# Patient Record
Sex: Female | Born: 1945 | Race: White | Hispanic: No | Marital: Single | Smoking: Never smoker
Health system: Southern US, Community
[De-identification: ages and names within clinical notes are randomized; demographics above are authoritative.]

## PROBLEM LIST (undated history)

## (undated) DIAGNOSIS — R011 Cardiac murmur, unspecified: Secondary | ICD-10-CM

## (undated) DIAGNOSIS — R0601 Orthopnea: Secondary | ICD-10-CM

## (undated) DIAGNOSIS — G479 Sleep disorder, unspecified: Secondary | ICD-10-CM

## (undated) DIAGNOSIS — D1803 Hemangioma of intra-abdominal structures: Secondary | ICD-10-CM

## (undated) DIAGNOSIS — J4520 Mild intermittent asthma, uncomplicated: Secondary | ICD-10-CM

## (undated) DIAGNOSIS — K759 Inflammatory liver disease, unspecified: Secondary | ICD-10-CM

## (undated) DIAGNOSIS — K2210 Ulcer of esophagus without bleeding: Secondary | ICD-10-CM

## (undated) DIAGNOSIS — I499 Cardiac arrhythmia, unspecified: Secondary | ICD-10-CM

## (undated) DIAGNOSIS — K219 Gastro-esophageal reflux disease without esophagitis: Secondary | ICD-10-CM

## (undated) DIAGNOSIS — R609 Edema, unspecified: Secondary | ICD-10-CM

## (undated) DIAGNOSIS — E785 Hyperlipidemia, unspecified: Secondary | ICD-10-CM

## (undated) DIAGNOSIS — N189 Chronic kidney disease, unspecified: Secondary | ICD-10-CM

## (undated) DIAGNOSIS — M810 Age-related osteoporosis without current pathological fracture: Secondary | ICD-10-CM

## (undated) DIAGNOSIS — R809 Proteinuria, unspecified: Secondary | ICD-10-CM

## (undated) DIAGNOSIS — T8859XA Other complications of anesthesia, initial encounter: Secondary | ICD-10-CM

## (undated) DIAGNOSIS — Z8719 Personal history of other diseases of the digestive system: Secondary | ICD-10-CM

## (undated) DIAGNOSIS — E041 Nontoxic single thyroid nodule: Secondary | ICD-10-CM

## (undated) DIAGNOSIS — I341 Nonrheumatic mitral (valve) prolapse: Secondary | ICD-10-CM

## (undated) DIAGNOSIS — T4145XA Adverse effect of unspecified anesthetic, initial encounter: Secondary | ICD-10-CM

## (undated) DIAGNOSIS — K824 Cholesterolosis of gallbladder: Secondary | ICD-10-CM

## (undated) DIAGNOSIS — R911 Solitary pulmonary nodule: Secondary | ICD-10-CM

## (undated) HISTORY — PX: TRIGGER FINGER RELEASE: SHX641

## (undated) HISTORY — DX: Hemangioma of intra-abdominal structures: D18.03

## (undated) HISTORY — DX: Sleep disorder, unspecified: G47.9

## (undated) HISTORY — PX: TONSILLECTOMY: SUR1361

## (undated) HISTORY — DX: Mild intermittent asthma, uncomplicated: J45.20

## (undated) HISTORY — DX: Age-related osteoporosis without current pathological fracture: M81.0

## (undated) HISTORY — DX: Ulcer of esophagus without bleeding: K22.10

## (undated) HISTORY — DX: Proteinuria, unspecified: R80.9

## (undated) HISTORY — DX: Nontoxic single thyroid nodule: E04.1

## (undated) HISTORY — DX: Solitary pulmonary nodule: R91.1

## (undated) HISTORY — PX: APPENDECTOMY: SHX54

## (undated) HISTORY — DX: Nonrheumatic mitral (valve) prolapse: I34.1

## (undated) HISTORY — DX: Cholesterolosis of gallbladder: K82.4

## (undated) HISTORY — PX: SHOULDER SURGERY: SHX246

## (undated) HISTORY — PX: GANGLION CYST EXCISION: SHX1691

## (undated) HISTORY — DX: Hyperlipidemia, unspecified: E78.5

---

## 1985-05-09 HISTORY — PX: CARPAL TUNNEL RELEASE: SHX101

## 1999-01-08 DIAGNOSIS — R911 Solitary pulmonary nodule: Secondary | ICD-10-CM

## 1999-01-08 HISTORY — DX: Solitary pulmonary nodule: R91.1

## 2000-07-07 LAB — HM COLONOSCOPY: HM Colonoscopy: NORMAL

## 2011-05-11 DIAGNOSIS — M24139 Other articular cartilage disorders, unspecified wrist: Secondary | ICD-10-CM | POA: Diagnosis not present

## 2011-05-11 DIAGNOSIS — M25549 Pain in joints of unspecified hand: Secondary | ICD-10-CM | POA: Diagnosis not present

## 2011-05-11 DIAGNOSIS — M25539 Pain in unspecified wrist: Secondary | ICD-10-CM | POA: Diagnosis not present

## 2011-05-13 DIAGNOSIS — M24139 Other articular cartilage disorders, unspecified wrist: Secondary | ICD-10-CM | POA: Diagnosis not present

## 2011-05-13 DIAGNOSIS — M25539 Pain in unspecified wrist: Secondary | ICD-10-CM | POA: Diagnosis not present

## 2011-05-13 DIAGNOSIS — M25549 Pain in joints of unspecified hand: Secondary | ICD-10-CM | POA: Diagnosis not present

## 2011-05-17 DIAGNOSIS — M25539 Pain in unspecified wrist: Secondary | ICD-10-CM | POA: Diagnosis not present

## 2011-05-17 DIAGNOSIS — M25549 Pain in joints of unspecified hand: Secondary | ICD-10-CM | POA: Diagnosis not present

## 2011-05-17 DIAGNOSIS — M24139 Other articular cartilage disorders, unspecified wrist: Secondary | ICD-10-CM | POA: Diagnosis not present

## 2011-05-20 DIAGNOSIS — M25549 Pain in joints of unspecified hand: Secondary | ICD-10-CM | POA: Diagnosis not present

## 2011-05-20 DIAGNOSIS — M25539 Pain in unspecified wrist: Secondary | ICD-10-CM | POA: Diagnosis not present

## 2011-05-20 DIAGNOSIS — M24139 Other articular cartilage disorders, unspecified wrist: Secondary | ICD-10-CM | POA: Diagnosis not present

## 2011-05-25 DIAGNOSIS — H40019 Open angle with borderline findings, low risk, unspecified eye: Secondary | ICD-10-CM | POA: Diagnosis not present

## 2011-05-25 DIAGNOSIS — R5381 Other malaise: Secondary | ICD-10-CM | POA: Diagnosis not present

## 2011-05-25 DIAGNOSIS — M109 Gout, unspecified: Secondary | ICD-10-CM | POA: Diagnosis not present

## 2011-05-25 DIAGNOSIS — R5383 Other fatigue: Secondary | ICD-10-CM | POA: Diagnosis not present

## 2011-05-25 DIAGNOSIS — E559 Vitamin D deficiency, unspecified: Secondary | ICD-10-CM | POA: Diagnosis not present

## 2011-05-25 DIAGNOSIS — E785 Hyperlipidemia, unspecified: Secondary | ICD-10-CM | POA: Diagnosis not present

## 2011-05-25 DIAGNOSIS — I059 Rheumatic mitral valve disease, unspecified: Secondary | ICD-10-CM | POA: Diagnosis not present

## 2011-05-25 DIAGNOSIS — N182 Chronic kidney disease, stage 2 (mild): Secondary | ICD-10-CM | POA: Diagnosis not present

## 2011-05-25 DIAGNOSIS — D509 Iron deficiency anemia, unspecified: Secondary | ICD-10-CM | POA: Diagnosis not present

## 2011-05-25 DIAGNOSIS — E039 Hypothyroidism, unspecified: Secondary | ICD-10-CM | POA: Diagnosis not present

## 2011-06-01 DIAGNOSIS — M25549 Pain in joints of unspecified hand: Secondary | ICD-10-CM | POA: Diagnosis not present

## 2011-06-01 DIAGNOSIS — M24139 Other articular cartilage disorders, unspecified wrist: Secondary | ICD-10-CM | POA: Diagnosis not present

## 2011-06-01 DIAGNOSIS — M25539 Pain in unspecified wrist: Secondary | ICD-10-CM | POA: Diagnosis not present

## 2011-06-08 DIAGNOSIS — M24139 Other articular cartilage disorders, unspecified wrist: Secondary | ICD-10-CM | POA: Diagnosis not present

## 2011-06-08 DIAGNOSIS — M25539 Pain in unspecified wrist: Secondary | ICD-10-CM | POA: Diagnosis not present

## 2011-06-08 DIAGNOSIS — M25549 Pain in joints of unspecified hand: Secondary | ICD-10-CM | POA: Diagnosis not present

## 2011-06-10 DIAGNOSIS — M25549 Pain in joints of unspecified hand: Secondary | ICD-10-CM | POA: Diagnosis not present

## 2011-06-10 DIAGNOSIS — M24139 Other articular cartilage disorders, unspecified wrist: Secondary | ICD-10-CM | POA: Diagnosis not present

## 2011-06-10 DIAGNOSIS — M25539 Pain in unspecified wrist: Secondary | ICD-10-CM | POA: Diagnosis not present

## 2011-06-15 DIAGNOSIS — M25549 Pain in joints of unspecified hand: Secondary | ICD-10-CM | POA: Diagnosis not present

## 2011-06-15 DIAGNOSIS — M24139 Other articular cartilage disorders, unspecified wrist: Secondary | ICD-10-CM | POA: Diagnosis not present

## 2011-06-15 DIAGNOSIS — M25539 Pain in unspecified wrist: Secondary | ICD-10-CM | POA: Diagnosis not present

## 2011-11-14 DIAGNOSIS — D509 Iron deficiency anemia, unspecified: Secondary | ICD-10-CM | POA: Diagnosis not present

## 2011-11-14 DIAGNOSIS — I059 Rheumatic mitral valve disease, unspecified: Secondary | ICD-10-CM | POA: Diagnosis not present

## 2011-11-14 DIAGNOSIS — R5381 Other malaise: Secondary | ICD-10-CM | POA: Diagnosis not present

## 2011-11-14 DIAGNOSIS — E213 Hyperparathyroidism, unspecified: Secondary | ICD-10-CM | POA: Diagnosis not present

## 2011-11-14 DIAGNOSIS — E559 Vitamin D deficiency, unspecified: Secondary | ICD-10-CM | POA: Diagnosis not present

## 2011-11-14 DIAGNOSIS — H40019 Open angle with borderline findings, low risk, unspecified eye: Secondary | ICD-10-CM | POA: Diagnosis not present

## 2011-11-14 DIAGNOSIS — E039 Hypothyroidism, unspecified: Secondary | ICD-10-CM | POA: Diagnosis not present

## 2011-11-14 DIAGNOSIS — E785 Hyperlipidemia, unspecified: Secondary | ICD-10-CM | POA: Diagnosis not present

## 2012-01-05 DIAGNOSIS — E039 Hypothyroidism, unspecified: Secondary | ICD-10-CM | POA: Diagnosis not present

## 2012-01-05 DIAGNOSIS — E785 Hyperlipidemia, unspecified: Secondary | ICD-10-CM | POA: Diagnosis not present

## 2012-01-05 DIAGNOSIS — M545 Low back pain: Secondary | ICD-10-CM | POA: Diagnosis not present

## 2012-01-05 DIAGNOSIS — Z23 Encounter for immunization: Secondary | ICD-10-CM | POA: Diagnosis not present

## 2012-01-05 DIAGNOSIS — R5381 Other malaise: Secondary | ICD-10-CM | POA: Diagnosis not present

## 2012-01-05 DIAGNOSIS — R5383 Other fatigue: Secondary | ICD-10-CM | POA: Diagnosis not present

## 2012-01-05 DIAGNOSIS — I059 Rheumatic mitral valve disease, unspecified: Secondary | ICD-10-CM | POA: Diagnosis not present

## 2012-01-30 DIAGNOSIS — M810 Age-related osteoporosis without current pathological fracture: Secondary | ICD-10-CM | POA: Diagnosis not present

## 2012-02-22 DIAGNOSIS — M5126 Other intervertebral disc displacement, lumbar region: Secondary | ICD-10-CM | POA: Diagnosis not present

## 2012-02-22 DIAGNOSIS — M549 Dorsalgia, unspecified: Secondary | ICD-10-CM | POA: Diagnosis not present

## 2012-04-25 DIAGNOSIS — E039 Hypothyroidism, unspecified: Secondary | ICD-10-CM | POA: Diagnosis not present

## 2012-04-25 DIAGNOSIS — E785 Hyperlipidemia, unspecified: Secondary | ICD-10-CM | POA: Diagnosis not present

## 2012-04-25 DIAGNOSIS — R5383 Other fatigue: Secondary | ICD-10-CM | POA: Diagnosis not present

## 2012-04-25 DIAGNOSIS — E119 Type 2 diabetes mellitus without complications: Secondary | ICD-10-CM | POA: Diagnosis not present

## 2012-04-25 DIAGNOSIS — R978 Other abnormal tumor markers: Secondary | ICD-10-CM | POA: Diagnosis not present

## 2012-04-25 DIAGNOSIS — E559 Vitamin D deficiency, unspecified: Secondary | ICD-10-CM | POA: Diagnosis not present

## 2012-04-25 DIAGNOSIS — IMO0002 Reserved for concepts with insufficient information to code with codable children: Secondary | ICD-10-CM | POA: Diagnosis not present

## 2012-04-25 DIAGNOSIS — D509 Iron deficiency anemia, unspecified: Secondary | ICD-10-CM | POA: Diagnosis not present

## 2012-04-25 DIAGNOSIS — R5381 Other malaise: Secondary | ICD-10-CM | POA: Diagnosis not present

## 2012-04-25 DIAGNOSIS — N390 Urinary tract infection, site not specified: Secondary | ICD-10-CM | POA: Diagnosis not present

## 2012-05-18 DIAGNOSIS — R0789 Other chest pain: Secondary | ICD-10-CM | POA: Diagnosis not present

## 2012-05-18 DIAGNOSIS — R002 Palpitations: Secondary | ICD-10-CM | POA: Diagnosis not present

## 2012-05-18 DIAGNOSIS — M545 Low back pain: Secondary | ICD-10-CM | POA: Diagnosis not present

## 2012-05-18 DIAGNOSIS — I059 Rheumatic mitral valve disease, unspecified: Secondary | ICD-10-CM | POA: Diagnosis not present

## 2012-05-18 DIAGNOSIS — Z01419 Encounter for gynecological examination (general) (routine) without abnormal findings: Secondary | ICD-10-CM | POA: Diagnosis not present

## 2012-05-22 DIAGNOSIS — N281 Cyst of kidney, acquired: Secondary | ICD-10-CM | POA: Diagnosis not present

## 2012-05-22 DIAGNOSIS — N949 Unspecified condition associated with female genital organs and menstrual cycle: Secondary | ICD-10-CM | POA: Diagnosis not present

## 2012-05-22 DIAGNOSIS — K824 Cholesterolosis of gallbladder: Secondary | ICD-10-CM | POA: Diagnosis not present

## 2012-05-22 DIAGNOSIS — E049 Nontoxic goiter, unspecified: Secondary | ICD-10-CM | POA: Diagnosis not present

## 2012-06-05 DIAGNOSIS — N182 Chronic kidney disease, stage 2 (mild): Secondary | ICD-10-CM | POA: Diagnosis not present

## 2012-06-07 DIAGNOSIS — Z5181 Encounter for therapeutic drug level monitoring: Secondary | ICD-10-CM | POA: Diagnosis not present

## 2012-06-07 DIAGNOSIS — M5137 Other intervertebral disc degeneration, lumbosacral region: Secondary | ICD-10-CM | POA: Diagnosis not present

## 2012-06-28 DIAGNOSIS — M5137 Other intervertebral disc degeneration, lumbosacral region: Secondary | ICD-10-CM | POA: Diagnosis not present

## 2012-06-28 DIAGNOSIS — Z5181 Encounter for therapeutic drug level monitoring: Secondary | ICD-10-CM | POA: Diagnosis not present

## 2012-07-16 DIAGNOSIS — H2510 Age-related nuclear cataract, unspecified eye: Secondary | ICD-10-CM | POA: Diagnosis not present

## 2012-07-16 DIAGNOSIS — H40019 Open angle with borderline findings, low risk, unspecified eye: Secondary | ICD-10-CM | POA: Diagnosis not present

## 2012-07-26 DIAGNOSIS — M545 Low back pain: Secondary | ICD-10-CM | POA: Diagnosis not present

## 2012-07-26 DIAGNOSIS — R5381 Other malaise: Secondary | ICD-10-CM | POA: Diagnosis not present

## 2012-07-26 DIAGNOSIS — R002 Palpitations: Secondary | ICD-10-CM | POA: Diagnosis not present

## 2012-07-26 DIAGNOSIS — Z79899 Other long term (current) drug therapy: Secondary | ICD-10-CM | POA: Diagnosis not present

## 2012-07-26 DIAGNOSIS — M810 Age-related osteoporosis without current pathological fracture: Secondary | ICD-10-CM | POA: Diagnosis not present

## 2012-07-26 DIAGNOSIS — E785 Hyperlipidemia, unspecified: Secondary | ICD-10-CM | POA: Diagnosis not present

## 2012-07-26 DIAGNOSIS — M1990 Unspecified osteoarthritis, unspecified site: Secondary | ICD-10-CM | POA: Diagnosis not present

## 2012-07-26 DIAGNOSIS — D509 Iron deficiency anemia, unspecified: Secondary | ICD-10-CM | POA: Diagnosis not present

## 2012-07-26 DIAGNOSIS — E559 Vitamin D deficiency, unspecified: Secondary | ICD-10-CM | POA: Diagnosis not present

## 2012-07-26 DIAGNOSIS — M461 Sacroiliitis, not elsewhere classified: Secondary | ICD-10-CM | POA: Diagnosis not present

## 2012-07-26 DIAGNOSIS — R5383 Other fatigue: Secondary | ICD-10-CM | POA: Diagnosis not present

## 2012-08-06 DIAGNOSIS — H40019 Open angle with borderline findings, low risk, unspecified eye: Secondary | ICD-10-CM | POA: Diagnosis not present

## 2012-11-01 DIAGNOSIS — M810 Age-related osteoporosis without current pathological fracture: Secondary | ICD-10-CM | POA: Diagnosis not present

## 2012-11-06 LAB — HM DEXA SCAN

## 2012-11-06 LAB — HM MAMMOGRAPHY: HM Mammogram: NORMAL

## 2012-11-13 DIAGNOSIS — D509 Iron deficiency anemia, unspecified: Secondary | ICD-10-CM | POA: Diagnosis not present

## 2012-11-13 DIAGNOSIS — M129 Arthropathy, unspecified: Secondary | ICD-10-CM | POA: Diagnosis not present

## 2012-11-13 DIAGNOSIS — E559 Vitamin D deficiency, unspecified: Secondary | ICD-10-CM | POA: Diagnosis not present

## 2012-11-13 DIAGNOSIS — E039 Hypothyroidism, unspecified: Secondary | ICD-10-CM | POA: Diagnosis not present

## 2012-11-13 DIAGNOSIS — R922 Inconclusive mammogram: Secondary | ICD-10-CM | POA: Diagnosis not present

## 2012-11-13 DIAGNOSIS — Z1231 Encounter for screening mammogram for malignant neoplasm of breast: Secondary | ICD-10-CM | POA: Diagnosis not present

## 2012-11-13 DIAGNOSIS — M109 Gout, unspecified: Secondary | ICD-10-CM | POA: Diagnosis not present

## 2012-11-13 DIAGNOSIS — R5381 Other malaise: Secondary | ICD-10-CM | POA: Diagnosis not present

## 2012-11-13 DIAGNOSIS — M069 Rheumatoid arthritis, unspecified: Secondary | ICD-10-CM | POA: Diagnosis not present

## 2012-11-13 DIAGNOSIS — S66819A Strain of other specified muscles, fascia and tendons at wrist and hand level, unspecified hand, initial encounter: Secondary | ICD-10-CM | POA: Diagnosis not present

## 2012-11-13 DIAGNOSIS — R5383 Other fatigue: Secondary | ICD-10-CM | POA: Diagnosis not present

## 2012-11-13 DIAGNOSIS — I10 Essential (primary) hypertension: Secondary | ICD-10-CM | POA: Diagnosis not present

## 2012-11-13 DIAGNOSIS — E785 Hyperlipidemia, unspecified: Secondary | ICD-10-CM | POA: Diagnosis not present

## 2012-11-23 DIAGNOSIS — R5383 Other fatigue: Secondary | ICD-10-CM | POA: Diagnosis not present

## 2012-11-23 DIAGNOSIS — R5381 Other malaise: Secondary | ICD-10-CM | POA: Diagnosis not present

## 2012-11-23 DIAGNOSIS — M25539 Pain in unspecified wrist: Secondary | ICD-10-CM | POA: Diagnosis not present

## 2012-11-23 DIAGNOSIS — R918 Other nonspecific abnormal finding of lung field: Secondary | ICD-10-CM | POA: Diagnosis not present

## 2012-12-03 DIAGNOSIS — I059 Rheumatic mitral valve disease, unspecified: Secondary | ICD-10-CM | POA: Diagnosis not present

## 2012-12-03 DIAGNOSIS — H35379 Puckering of macula, unspecified eye: Secondary | ICD-10-CM | POA: Diagnosis not present

## 2012-12-03 DIAGNOSIS — R002 Palpitations: Secondary | ICD-10-CM | POA: Diagnosis not present

## 2012-12-03 DIAGNOSIS — E785 Hyperlipidemia, unspecified: Secondary | ICD-10-CM | POA: Diagnosis not present

## 2012-12-03 DIAGNOSIS — R0789 Other chest pain: Secondary | ICD-10-CM | POA: Diagnosis not present

## 2012-12-17 DIAGNOSIS — S63599A Other specified sprain of unspecified wrist, initial encounter: Secondary | ICD-10-CM | POA: Diagnosis not present

## 2012-12-17 DIAGNOSIS — IMO0002 Reserved for concepts with insufficient information to code with codable children: Secondary | ICD-10-CM | POA: Diagnosis not present

## 2013-02-19 DIAGNOSIS — Z23 Encounter for immunization: Secondary | ICD-10-CM | POA: Diagnosis not present

## 2013-06-28 ENCOUNTER — Encounter: Payer: Self-pay | Admitting: Internal Medicine

## 2013-06-28 ENCOUNTER — Ambulatory Visit (INDEPENDENT_AMBULATORY_CARE_PROVIDER_SITE_OTHER): Payer: Medicare Other | Admitting: Internal Medicine

## 2013-06-28 VITALS — BP 110/68 | HR 66 | Temp 98.6°F | Ht <= 58 in | Wt 96.0 lb

## 2013-06-28 DIAGNOSIS — K208 Other esophagitis without bleeding: Secondary | ICD-10-CM

## 2013-06-28 DIAGNOSIS — I341 Nonrheumatic mitral (valve) prolapse: Secondary | ICD-10-CM

## 2013-06-28 DIAGNOSIS — G479 Sleep disorder, unspecified: Secondary | ICD-10-CM | POA: Insufficient documentation

## 2013-06-28 DIAGNOSIS — K2210 Ulcer of esophagus without bleeding: Secondary | ICD-10-CM

## 2013-06-28 DIAGNOSIS — M810 Age-related osteoporosis without current pathological fracture: Secondary | ICD-10-CM | POA: Insufficient documentation

## 2013-06-28 DIAGNOSIS — J4520 Mild intermittent asthma, uncomplicated: Secondary | ICD-10-CM

## 2013-06-28 DIAGNOSIS — E785 Hyperlipidemia, unspecified: Secondary | ICD-10-CM

## 2013-06-28 DIAGNOSIS — E041 Nontoxic single thyroid nodule: Secondary | ICD-10-CM | POA: Insufficient documentation

## 2013-06-28 DIAGNOSIS — J45909 Unspecified asthma, uncomplicated: Secondary | ICD-10-CM

## 2013-06-28 DIAGNOSIS — R809 Proteinuria, unspecified: Secondary | ICD-10-CM | POA: Insufficient documentation

## 2013-06-28 DIAGNOSIS — I059 Rheumatic mitral valve disease, unspecified: Secondary | ICD-10-CM

## 2013-06-28 DIAGNOSIS — Z23 Encounter for immunization: Secondary | ICD-10-CM

## 2013-06-28 MED ORDER — ATORVASTATIN CALCIUM 10 MG PO TABS: 10.0000 mg | ORAL_TABLET | Freq: Every day | ORAL | Status: DC

## 2013-06-28 MED ORDER — ATENOLOL 25 MG PO TABS: 25.0000 mg | ORAL_TABLET | Freq: Every day | ORAL | Status: DC

## 2013-06-28 MED ORDER — AMLODIPINE BESYLATE 10 MG PO TABS: 10.0000 mg | ORAL_TABLET | Freq: Every day | ORAL | Status: DC

## 2013-06-28 NOTE — Progress Notes (Signed)
Pre visit review using our clinic review tool, if applicable. No additional management support is needed unless otherwise documented below in the visit note. 

## 2013-06-28 NOTE — Assessment & Plan Note (Signed)
Had CKD stage 3 in past---better now Will recheck at next visit

## 2013-06-28 NOTE — Assessment & Plan Note (Signed)
Has been better lately

## 2013-06-28 NOTE — Assessment & Plan Note (Signed)
Comfortable with primary prevention Labs due at next visit

## 2013-06-28 NOTE — Assessment & Plan Note (Signed)
Chronic No meds for this

## 2013-06-28 NOTE — Assessment & Plan Note (Signed)
Has not needed treatment We will need to see how she does in [place] allergy season

## 2013-06-28 NOTE — Progress Notes (Signed)
Subjective:    Patient ID: Shelby Oliver, female    DOB: 1945/09/24, 68 y.o.   MRN: [id number]  HPI Just moved to [facility] from [place] Retired and no family in [place] anymore Sister in [place] Has apartment in [place]  Discussed fitness She has to be very careful---injures easy Impingements in both shoulders, trigger fingers, carpal tunnel No specific diagnosis  Has hypercholesterolemia Has been on atorvastatin for some years Has tolerated well Usually takes 5 days per week---?some muscle soreness if takes daily  History of erosive esophagitis This is controlled on the PPI  Osteoporosis diagnosed Took fosamax for over 8 years Repeat DEXA last year-- into osteopenia range Now just on the vitamin D  Had tachycardia and heart murmur Diagnosed with early mitral valve prolapse Controlled with atenolol Still gets some palpitations Some muscular chest pain she relates to pectus  No current outpatient prescriptions on file prior to visit.   No current facility-administered medications on file prior to visit.    Allergies  Allergen Reactions  . Penicillins   . Quinolones     Past Medical History  Diagnosis Date  . Hyperlipidemia   . Mild intermittent asthma   . MVP (mitral valve prolapse)     with slight regurgitation  . Hepatic hemangioma   . Pulmonary nodule 2000's    stable over years--no more testing  . Thyroid nodule   . Proteinuria   . Osteoporosis   . Erosive esophagitis   . Gallbladder polyp   . Sleep disturbance     chronic    Past Surgical History  Procedure Laterality Date  . Carpal tunnel release Bilateral 1987  . Shoulder adhesion release Bilateral 1988, 2001  . Appendectomy    . Tonsillectomy      Family History  Problem Relation Age of Onset  . Cancer Mother   . Hyperlipidemia Mother   . Arthritis Mother   . Hypertension Mother   . Stroke Father     hemorrhagic  . Arthritis Father   . Heart disease Sister     mitral  valve repair  . Hypertension Sister   . Diabetes Neg Hx     History   Social History  . Marital Status: Single    Spouse Name: N/A    Number of Children: 0  . Years of Education: N/A   Occupational History  . Cytogeneticist     Retired   Social History Main Topics  . Smoking status: Never Smoker   . Smokeless tobacco: Never Used  . Alcohol Use: No  . Drug Use: No  . Sexual Activity: Not on file   Other Topics Concern  . Not on file   Social History Narrative   Wants to be called Shelby Oliver   Has living will   [name] and brother are her health care POA   Would accept resuscitation   No tube feeds if cognitively unaware   Review of Systems  Constitutional: Positive for unexpected weight change.       Has gained 20+# since menopause Wears seat belt  HENT: Negative for dental problem, hearing loss and tinnitus.        Not good about seeing dentist lately  Eyes: Negative for visual disturbance.       No diplopia or unilateral vision loss  Respiratory: Positive for cough. Negative for chest tightness and shortness of breath.        Mild asthma in the past--cough  variant Actually broke ribs from severe cough in the past  Cardiovascular: Positive for chest pain and palpitations. Negative for leg swelling.  Gastrointestinal: Negative for constipation and blood in stool.       Serious GERD issues in past---has been much better lately Took cardizem/reglan in past Treated for H pylori about 2 years ago  Genitourinary: Negative for dysuria, hematuria and difficulty urinating.  Musculoskeletal: Positive for arthralgias and back pain.       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 Negative for immunocompromised state.       Hay fever and some tree pollens  Neurological: Negative for dizziness,  syncope and light-headedness.  Hematological: Negative for adenopathy. Does not bruise/bleed easily.  Psychiatric/Behavioral: Positive for sleep disturbance. Negative for dysphoric mood. The patient is not nervous/anxious.        Interrupted sleep pattern       Objective:   Physical Exam  Constitutional: She appears well-developed and well-nourished. No distress.  HENT:  Mouth/Throat: Oropharynx is clear and moist. No oropharyngeal exudate.  Neck: Normal range of motion. Neck supple. No thyromegaly present.  Cardiovascular: Normal rate, regular rhythm, normal heart sounds and intact distal pulses.  Exam reveals no gallop.   No murmur heard. No click  Pulmonary/Chest: Effort normal and breath sounds normal. No respiratory distress. She has no wheezes. She has no rales.  Abdominal: Soft. There is no tenderness.  Musculoskeletal: She exhibits no edema and no tenderness.  Lymphadenopathy:    She has no cervical adenopathy.  Skin: No rash noted.  Psychiatric: She has a normal mood and affect. Her behavior is normal.          Assessment & Plan:

## 2013-06-28 NOTE — Assessment & Plan Note (Signed)
Palpitations controlled with the beta blocker

## 2013-09-24 DIAGNOSIS — H2510 Age-related nuclear cataract, unspecified eye: Secondary | ICD-10-CM | POA: Diagnosis not present

## 2013-09-25 ENCOUNTER — Ambulatory Visit (INDEPENDENT_AMBULATORY_CARE_PROVIDER_SITE_OTHER): Payer: Medicare Other | Admitting: Internal Medicine

## 2013-09-25 ENCOUNTER — Encounter: Payer: Self-pay | Admitting: Internal Medicine

## 2013-09-25 VITALS — BP 118/72 | HR 82 | Temp 99.1°F | Wt 91.0 lb

## 2013-09-25 DIAGNOSIS — J45909 Unspecified asthma, uncomplicated: Secondary | ICD-10-CM

## 2013-09-25 DIAGNOSIS — J309 Allergic rhinitis, unspecified: Secondary | ICD-10-CM | POA: Diagnosis not present

## 2013-09-25 DIAGNOSIS — J4520 Mild intermittent asthma, uncomplicated: Secondary | ICD-10-CM

## 2013-09-25 MED ORDER — ALBUTEROL SULFATE HFA 108 (90 BASE) MCG/ACT IN AERS: 2.0000 | INHALATION_SPRAY | Freq: Four times a day (QID) | RESPIRATORY_TRACT | Status: DC | PRN

## 2013-09-25 MED ORDER — PREDNISONE 10 MG PO TABS: ORAL_TABLET | ORAL | Status: DC

## 2013-09-25 NOTE — Progress Notes (Signed)
HPI  Pt presents to the clinic today with c/o cough, shortness of breath, runny nose and headache. She reports this started 1 week ago. The cough is non productive. She is blowing clear mucous out of her nose. She denies fever or chills. She has tried zyrtec, cough medicine and flonase. She does have a history of allergies and asthma. She has not had sick contacts that she is aware of.  Review of Systems      Past Medical History  Diagnosis Date  . Hyperlipidemia   . Mild intermittent asthma   . MVP (mitral valve prolapse)     with slight regurgitation  . Hepatic hemangioma   . Pulmonary nodule 2000's    stable over years--no more testing  . Thyroid nodule   . Proteinuria   . Osteoporosis   . Erosive esophagitis   . Gallbladder polyp   . Sleep disturbance     chronic    Family History  Problem Relation Age of Onset  . Cancer Mother   . Hyperlipidemia Mother   . Arthritis Mother   . Hypertension Mother   . Stroke Father     hemorrhagic  . Arthritis Father   . Heart disease Sister     mitral valve repair  . Hypertension Sister   . Diabetes Neg Hx     History   Social History  . Marital Status: Single    Spouse Name: N/A    Number of Children: 0  . Years of Education: N/A   Occupational History  . Cytogeneticist     Retired   Social History Main Topics  . Smoking status: Never Smoker   . Smokeless tobacco: Never Used  . Alcohol Use: No  . Drug Use: No  . Sexual Activity: Not on file   Other Topics Concern  . Not on file   Social History Narrative   Wants to be called Shelby Oliver   Has living will   [name] and brother are her health care POA   Would accept resuscitation   No tube feeds if cognitively unaware    Allergies  Allergen Reactions  . Penicillins   . Quinolones      Constitutional: Positive headache, fatigue. Denies fever or abrupt weight changes.  HEENT:  Positive nasal congestion, runny nose, watery eyes and  sore throat. Denies eye redness, eye pain, pressure behind the eyes, facial pain, ear pain, ringing in the ears, wax buildup, or bloody nose. Respiratory: Positive cough. Denies difficulty breathing or shortness of breath.  Cardiovascular: Denies chest pain, chest tightness, palpitations or swelling in the hands or feet.   No other specific complaints in a complete review of systems (except as listed in HPI above).  Objective:   BP 118/72  Pulse 82  Temp(Src) 99.1 F (37.3 C) (Oral)  Wt 91 lb (41.277 kg)  SpO2 97% Wt Readings from Last 3 Encounters:  09/25/13 91 lb (41.277 kg)  06/28/13 96 lb (43.545 kg)     General: Appears her stated age, well developed, well nourished in NAD. HEENT: Head: normal shape and size; Eyes: sclera white, no icterus, conjunctiva pink, PERRLA and EOMs intact; Ears: Tm's gray and intact, normal light reflex; Nose: mucosa boggy and moist, septum midline; Throat/Mouth: + PND. Teeth present, mucosa erythematous and moist, no exudate noted, no lesions or ulcerations noted.  Neck: Neck supple, trachea midline. No massses, lumps or thyromegaly present.  Cardiovascular: Normal rate and rhythm. S1,S2 noted.  No murmur, rubs or gallops noted. No JVD or BLE edema. No carotid bruits noted. Pulmonary/Chest: Normal effort and positive vesicular breath sounds. No respiratory distress. No wheezes, rales or ronchi noted.      Assessment & Plan:   Allergic Rhinitis:  Get some rest and drink plenty of water Do salt water gargles for the sore throat Continue flonase and zyrtec eRx for pred taper Continue OTC cough syrup  RTC as needed or if symptoms persist.

## 2013-09-25 NOTE — Patient Instructions (Addendum)
Allergic Rhinitis Allergic rhinitis is when the mucous membranes in the nose respond to allergens. Allergens are particles in the air that cause your body to have an allergic reaction. This causes you to release allergic antibodies. Through a chain of events, these eventually cause you to release histamine into the blood stream. Although meant to protect the body, it is this release of histamine that causes your discomfort, such as frequent sneezing, congestion, and an itchy, runny nose.  CAUSES  Seasonal allergic rhinitis (hay fever) is caused by pollen allergens that may come from grasses, trees, and weeds. Year-round allergic rhinitis (perennial allergic rhinitis) is caused by allergens such as house dust mites, pet dander, and mold spores.  SYMPTOMS   Nasal stuffiness (congestion).  Itchy, runny nose with sneezing and tearing of the eyes. DIAGNOSIS  Your health care provider can help you determine the allergen or allergens that trigger your symptoms. If you and your health care provider are unable to determine the allergen, skin or blood testing may be used. TREATMENT  Allergic Rhinitis does not have a cure, but it can be controlled by:  Medicines and allergy shots (immunotherapy).  Avoiding the allergen. Hay fever may often be treated with antihistamines in pill or nasal spray forms. Antihistamines block the effects of histamine. There are over-the-counter medicines that may help with nasal congestion and swelling around the eyes. Check with your health care provider before taking or giving this medicine.  If avoiding the allergen or the medicine prescribed do not work, there are many new medicines your health care provider can prescribe. Stronger medicine may be used if initial measures are ineffective. Desensitizing injections can be used if medicine and avoidance does not work. Desensitization is when a patient is given ongoing shots until the body becomes less sensitive to the allergen.  Make sure you follow up with your health care provider if problems continue. HOME CARE INSTRUCTIONS It is not possible to completely avoid allergens, but you can reduce your symptoms by taking steps to limit your exposure to them. It helps to know exactly what you are allergic to so that you can avoid your specific triggers. SEEK MEDICAL CARE IF:   You have a fever.  You develop a cough that does not stop easily (persistent).  You have shortness of breath.  You start wheezing.  Symptoms interfere with normal daily activities. Document Released: 01/18/2001 Document Revised: 02/13/2013 Document Reviewed: 12/31/2012 [employer] Patient Information 2014 [employer].  

## 2013-12-23 ENCOUNTER — Ambulatory Visit: Payer: Medicare Other | Admitting: Internal Medicine

## 2013-12-25 ENCOUNTER — Encounter (INDEPENDENT_AMBULATORY_CARE_PROVIDER_SITE_OTHER): Payer: Self-pay

## 2013-12-25 ENCOUNTER — Ambulatory Visit (INDEPENDENT_AMBULATORY_CARE_PROVIDER_SITE_OTHER): Payer: Medicare Other | Admitting: Internal Medicine

## 2013-12-25 ENCOUNTER — Encounter: Payer: Self-pay | Admitting: Internal Medicine

## 2013-12-25 VITALS — BP 110/70 | HR 65 | Temp 97.9°F | Ht <= 58 in | Wt 94.0 lb

## 2013-12-25 DIAGNOSIS — E785 Hyperlipidemia, unspecified: Secondary | ICD-10-CM

## 2013-12-25 DIAGNOSIS — J45909 Unspecified asthma, uncomplicated: Secondary | ICD-10-CM | POA: Diagnosis not present

## 2013-12-25 DIAGNOSIS — I059 Rheumatic mitral valve disease, unspecified: Secondary | ICD-10-CM | POA: Diagnosis not present

## 2013-12-25 DIAGNOSIS — K208 Other esophagitis without bleeding: Secondary | ICD-10-CM

## 2013-12-25 DIAGNOSIS — E041 Nontoxic single thyroid nodule: Secondary | ICD-10-CM | POA: Diagnosis not present

## 2013-12-25 DIAGNOSIS — I341 Nonrheumatic mitral (valve) prolapse: Secondary | ICD-10-CM

## 2013-12-25 DIAGNOSIS — J4520 Mild intermittent asthma, uncomplicated: Secondary | ICD-10-CM

## 2013-12-25 DIAGNOSIS — R809 Proteinuria, unspecified: Secondary | ICD-10-CM

## 2013-12-25 DIAGNOSIS — K2210 Ulcer of esophagus without bleeding: Secondary | ICD-10-CM

## 2013-12-25 LAB — LIPID PANEL
CHOL/HDL RATIO: 2
CHOLESTEROL: 171 mg/dL (ref 0–200)
HDL: 69.6 mg/dL (ref >39.00)
NONHDL: 101.4
Triglycerides: 252 mg/dL — ABNORMAL HIGH (ref 0.0–149.0)
VLDL: 50.4 mg/dL — ABNORMAL HIGH (ref 0.0–40.0)

## 2013-12-25 LAB — CBC WITH DIFFERENTIAL/PLATELET
BASOS PCT: 0.6 % (ref 0.0–3.0)
Basophils Absolute: 0 10*3/uL (ref 0.0–0.1)
EOS PCT: 1.8 % (ref 0.0–5.0)
Eosinophils Absolute: 0.1 10*3/uL (ref 0.0–0.7)
HEMATOCRIT: 39 % (ref 36.0–46.0)
Hemoglobin: 13.1 g/dL (ref 12.0–15.0)
LYMPHS ABS: 1.5 10*3/uL (ref 0.7–4.0)
Lymphocytes Relative: 20.3 % (ref 12.0–46.0)
MCHC: 33.5 g/dL (ref 30.0–36.0)
MCV: 89.2 fl (ref 78.0–100.0)
MONO ABS: 0.5 10*3/uL (ref 0.1–1.0)
Monocytes Relative: 7.1 % (ref 3.0–12.0)
NEUTROS ABS: 5.2 10*3/uL (ref 1.4–7.7)
Neutrophils Relative %: 70.2 % (ref 43.0–77.0)
Platelets: 234 10*3/uL (ref 150.0–400.0)
RBC: 4.37 Mil/uL (ref 3.87–5.11)
RDW: 12.8 % (ref 11.5–15.5)
WBC: 7.4 10*3/uL (ref 4.0–10.5)

## 2013-12-25 LAB — COMPREHENSIVE METABOLIC PANEL
ALK PHOS: 38 U/L — AB (ref 39–117)
ALT: 16 U/L (ref 0–35)
AST: 21 U/L (ref 0–37)
Albumin: 4.2 g/dL (ref 3.5–5.2)
BILIRUBIN TOTAL: 0.8 mg/dL (ref 0.2–1.2)
BUN: 19 mg/dL (ref 6–23)
CO2: 29 mEq/L (ref 19–32)
Calcium: 9.7 mg/dL (ref 8.4–10.5)
Chloride: 101 mEq/L (ref 96–112)
Creatinine, Ser: 0.7 mg/dL (ref 0.4–1.2)
GFR: 84.11 mL/min (ref >60.00)
Glucose, Bld: 92 mg/dL (ref 70–99)
Potassium: 4.7 mEq/L (ref 3.5–5.1)
SODIUM: 137 meq/L (ref 135–145)
Total Protein: 7.5 g/dL (ref 6.0–8.3)

## 2013-12-25 LAB — MICROALBUMIN / CREATININE URINE RATIO
CREATININE, U: 82.4 mg/dL
MICROALB UR: 1 mg/dL (ref 0.0–1.9)
Microalb Creat Ratio: 1.2 mg/g (ref 0.0–30.0)

## 2013-12-25 LAB — T4, FREE: Free T4: 0.88 ng/dL (ref 0.60–1.60)

## 2013-12-25 LAB — T3, FREE: T3, Free: 2.8 pg/mL (ref 2.3–4.2)

## 2013-12-25 LAB — LDL CHOLESTEROL, DIRECT: Direct LDL: 84.4 mg/dL

## 2013-12-25 LAB — TSH: TSH: 0.39 u[IU]/mL (ref 0.35–4.50)

## 2013-12-25 MED ORDER — ALBUTEROL SULFATE HFA 108 (90 BASE) MCG/ACT IN AERS: 2.0000 | INHALATION_SPRAY | Freq: Four times a day (QID) | RESPIRATORY_TRACT | Status: DC | PRN

## 2013-12-25 NOTE — Progress Notes (Signed)
Subjective:    Patient ID: Shelby Oliver, female    DOB: 1945/09/02, 68 y.o.   MRN: [id number]  HPI Still has issues with allergies and asthma Uses the albuterol now about once a week Very sensitive to pollen--- even the algae in ponds and dust in carpeting She does have hard wood in her apartment Ongoing congestion in head Had been on montelukast in the past  Has mammogram yearly--also usually gets sonogram Discussed that every 2 years is okay   Known thyroid nodules Gets ultrasounds of these repeatedly They have been stable though  Occasional trouble swallowing Abnormal esophageal contractions in past--better now Hasn't been needing the omeprazole lately---careful with her eating Only occasional regurgitation  Has gotten regular abdominal ultrasounds Following a gall bladder polyp Told her no reason to repeat---has stable 15mm polyp  Has had 24 hour urine protein determination yearly also Has been stable  Continues on the statin No problems on this  Current Outpatient Prescriptions on File Prior to Visit  Medication Sig Dispense Refill  . atenolol (TENORMIN) 25 MG tablet Take 1 tablet (25 mg total) by mouth daily.  90 tablet  3  . atorvastatin (LIPITOR) 10 MG tablet Take 1 tablet (10 mg total) by mouth daily.  90 tablet  3  . Cholecalciferol 1000 UNITS capsule Take 1,000 Units by mouth daily.      Marland Kitchen omeprazole (PRILOSEC) 20 MG capsule Take 20 mg by mouth daily as needed.       No current facility-administered medications on file prior to visit.    Allergies  Allergen Reactions  . Penicillins   . Quinolones     Past Medical History  Diagnosis Date  . Hyperlipidemia   . Mild intermittent asthma   . MVP (mitral valve prolapse)     with slight regurgitation  . Hepatic hemangioma   . Pulmonary nodule 2000's    stable over years--no more testing  . Thyroid nodule   . Proteinuria   . Osteoporosis   . Erosive esophagitis   . Gallbladder polyp   . Sleep  disturbance     chronic    Past Surgical History  Procedure Laterality Date  . Carpal tunnel release Bilateral 1987  . Shoulder adhesion release Bilateral 1988, 2001  . Appendectomy    . Tonsillectomy      Family History  Problem Relation Age of Onset  . Cancer Mother   . Hyperlipidemia Mother   . Arthritis Mother   . Hypertension Mother   . Stroke Father     hemorrhagic  . Arthritis Father   . Heart disease Sister     mitral valve repair  . Hypertension Sister   . Diabetes Neg Hx     History   Social History  . Marital Status: Single    Spouse Name: N/A    Number of Children: 0  . Years of Education: N/A   Occupational History  . Cytogeneticist     Retired   Social History Main Topics  . Smoking status: Never Smoker   . Smokeless tobacco: Never Used  . Alcohol Use: No  . Drug Use: No  . Sexual Activity: Not on file   Other Topics Concern  . Not on file   Social History Narrative   Wants to be called Shelby Oliver   Has living will   Shelby Oliver and brother are her health care POA   Would accept resuscitation   No tube feeds  if cognitively unaware   Review of Systems Weight is stable Very healthy diet-- limits protein Not a great sleeper--initiates well. Then up after a couple of hours and up and down hourly. No problems during the day if she is active Still feels she is adjusting here--doesn't miss the city but misses neighbors.     Objective:   Physical Exam  Constitutional: She appears well-developed and well-nourished. No distress.  Neck: Normal range of motion. Neck supple.  Small bilateral thyroid nodules felt below sternal notch  Cardiovascular: Normal rate, regular rhythm and intact distal pulses.  Exam reveals no gallop.   No murmur heard. Soft midsystolic click  Pulmonary/Chest: Effort normal and breath sounds normal. No respiratory distress. She has no wheezes. She has no rales.  Abdominal: Soft. There is no  tenderness.  Musculoskeletal: She exhibits no edema and no tenderness.  Lymphadenopathy:    She has no cervical adenopathy.  Psychiatric: She has a normal mood and affect. Her behavior is normal.          Assessment & Plan:

## 2013-12-25 NOTE — Assessment & Plan Note (Signed)
Mostly allergy related If worsens, would add back montelukast

## 2013-12-25 NOTE — Assessment & Plan Note (Signed)
Mild and stable Will just check spot urine

## 2013-12-25 NOTE — Assessment & Plan Note (Signed)
No problems with the statin Due for labs

## 2013-12-25 NOTE — Assessment & Plan Note (Signed)
Better now Rarely needs the PPI now

## 2013-12-25 NOTE — Assessment & Plan Note (Signed)
Seems euthyroid ?Will check labs ?

## 2013-12-25 NOTE — Progress Notes (Signed)
Pre visit review using our clinic review tool, if applicable. No additional management support is needed unless otherwise documented below in the visit note. 

## 2013-12-25 NOTE — Assessment & Plan Note (Signed)
Still occasional palpitations No chest pain Will continue the atenolol

## 2014-01-14 ENCOUNTER — Telehealth: Payer: Self-pay

## 2014-01-14 NOTE — Telephone Encounter (Signed)
Pt left v/m; pt was seen 12/25/13 and discussed with Dr Silvio Pate about pt starting singulair in spring due to allergies; pt has started with allergy symptoms including cough; pt would like to start singulair now. Please advise. [place].

## 2014-01-15 MED ORDER — MONTELUKAST SODIUM 10 MG PO TABS: 10.0000 mg | ORAL_TABLET | Freq: Every day | ORAL | Status: DC

## 2014-01-15 NOTE — Telephone Encounter (Signed)
Spoke with patient and advised results   

## 2014-01-15 NOTE — Telephone Encounter (Signed)
Please let her know I sent the prescription 

## 2014-01-16 ENCOUNTER — Ambulatory Visit (INDEPENDENT_AMBULATORY_CARE_PROVIDER_SITE_OTHER): Payer: Medicare Other | Admitting: Internal Medicine

## 2014-01-16 ENCOUNTER — Encounter: Payer: Self-pay | Admitting: Internal Medicine

## 2014-01-16 VITALS — BP 110/70 | HR 74 | Temp 97.7°F | Wt 96.0 lb

## 2014-01-16 DIAGNOSIS — R300 Dysuria: Secondary | ICD-10-CM

## 2014-01-16 DIAGNOSIS — N390 Urinary tract infection, site not specified: Secondary | ICD-10-CM

## 2014-01-16 LAB — POCT URINALYSIS DIPSTICK
Bilirubin, UA: NEGATIVE
Glucose, UA: NEGATIVE
KETONES UA: NEGATIVE
Nitrite, UA: POSITIVE
SPEC GRAV UA: 1.02
Urobilinogen, UA: NEGATIVE
pH, UA: 6

## 2014-01-16 MED ORDER — SULFAMETHOXAZOLE-TMP DS 800-160 MG PO TABS: 1.0000 | ORAL_TABLET | Freq: Two times a day (BID) | ORAL | Status: DC

## 2014-01-16 NOTE — Progress Notes (Signed)
Subjective:    Patient ID: Shelby Oliver, female    DOB: 11-Dec-1945, 68 y.o.   MRN: [id number]  HPI Has had urinary frequency for 4-5 days Dysuria and urgency for the past 3 days Feels like she has to squeeze it out and can't empty completely Fever to 102 yesterday which is better now Mostly terminal dysuria now  Doesn't feel that she has been drinking that much--hasn't tried to increase fluids with the symptoms No other Rx  Current Outpatient Prescriptions on File Prior to Visit  Medication Sig Dispense Refill  . albuterol (PROVENTIL HFA;VENTOLIN HFA) 108 (90 BASE) MCG/ACT inhaler Inhale 2 puffs into the lungs every 6 (six) hours as needed for wheezing or shortness of breath.  1 Inhaler  0  . atenolol (TENORMIN) 25 MG tablet Take 1 tablet (25 mg total) by mouth daily.  90 tablet  3  . atorvastatin (LIPITOR) 10 MG tablet Take 1 tablet (10 mg total) by mouth daily.  90 tablet  3  . Cholecalciferol 1000 UNITS capsule Take 1,000 Units by mouth daily.      . montelukast (SINGULAIR) 10 MG tablet Take 1 tablet (10 mg total) by mouth daily.  30 tablet  11  . omeprazole (PRILOSEC) 20 MG capsule Take 20 mg by mouth daily as needed.       No current facility-administered medications on file prior to visit.    Allergies  Allergen Reactions  . Penicillins   . Quinolones     Past Medical History  Diagnosis Date  . Hyperlipidemia   . Mild intermittent asthma   . MVP (mitral valve prolapse)     with slight regurgitation  . Hepatic hemangioma   . Pulmonary nodule 2000's    stable over years--no more testing  . Thyroid nodule   . Proteinuria   . Osteoporosis   . Erosive esophagitis   . Gallbladder polyp   . Sleep disturbance     chronic    Past Surgical History  Procedure Laterality Date  . Carpal tunnel release Bilateral 1987  . Shoulder adhesion release Bilateral 1988, 2001  . Appendectomy    . Tonsillectomy      Family History  Problem Relation Age of Onset  . Cancer  Mother   . Hyperlipidemia Mother   . Arthritis Mother   . Hypertension Mother   . Stroke Father     hemorrhagic  . Arthritis Father   . Heart disease Sister     mitral valve repair  . Hypertension Sister   . Diabetes Neg Hx     History   Social History  . Marital Status: Single    Spouse Name: N/A    Number of Children: 0  . Years of Education: N/A   Occupational History  . Cytogeneticist     Retired   Social History Main Topics  . Smoking status: Never Smoker   . Smokeless tobacco: Never Used  . Alcohol Use: No  . Drug Use: No  . Sexual Activity: Not on file   Other Topics Concern  . Not on file   Social History Narrative   Wants to be called Shelby Oliver   Has living will   Shelby Oliver and brother are her health care POA   Would accept resuscitation   No tube feeds if cognitively unaware   Review of Systems No nausea or vomiting Appetite okay  Some general aching     Objective:   Physical Exam  Constitutional: She appears well-developed and well-nourished. No distress.  Abdominal: She exhibits no distension. There is no rebound and no guarding.  Sensitive in lower abdomen but no distinct tenderness  Musculoskeletal:  No CVA tenderness          Assessment & Plan:

## 2014-01-16 NOTE — Assessment & Plan Note (Signed)
Mostly sounds like cystitis but fever and systemic symptoms yesterday only Will treat with bactrim 3 days if resolves quickly

## 2014-01-16 NOTE — Patient Instructions (Signed)
If your symptoms are gone with the first or second dose, you can stop the antibiotic after 3 days.

## 2014-02-27 DIAGNOSIS — Z23 Encounter for immunization: Secondary | ICD-10-CM | POA: Diagnosis not present

## 2014-08-24 ENCOUNTER — Other Ambulatory Visit: Payer: Self-pay | Admitting: Internal Medicine

## 2014-11-07 DIAGNOSIS — M461 Sacroiliitis, not elsewhere classified: Secondary | ICD-10-CM | POA: Diagnosis not present

## 2014-11-07 DIAGNOSIS — M5416 Radiculopathy, lumbar region: Secondary | ICD-10-CM | POA: Diagnosis not present

## 2014-11-07 DIAGNOSIS — M5136 Other intervertebral disc degeneration, lumbar region: Secondary | ICD-10-CM | POA: Diagnosis not present

## 2014-11-12 DIAGNOSIS — R262 Difficulty in walking, not elsewhere classified: Secondary | ICD-10-CM | POA: Diagnosis not present

## 2014-11-12 DIAGNOSIS — M545 Low back pain: Secondary | ICD-10-CM | POA: Diagnosis not present

## 2014-11-12 DIAGNOSIS — M6281 Muscle weakness (generalized): Secondary | ICD-10-CM | POA: Diagnosis not present

## 2014-11-14 DIAGNOSIS — R262 Difficulty in walking, not elsewhere classified: Secondary | ICD-10-CM | POA: Diagnosis not present

## 2014-11-14 DIAGNOSIS — M545 Low back pain: Secondary | ICD-10-CM | POA: Diagnosis not present

## 2014-11-14 DIAGNOSIS — M6281 Muscle weakness (generalized): Secondary | ICD-10-CM | POA: Diagnosis not present

## 2014-11-17 DIAGNOSIS — R262 Difficulty in walking, not elsewhere classified: Secondary | ICD-10-CM | POA: Diagnosis not present

## 2014-11-17 DIAGNOSIS — M545 Low back pain: Secondary | ICD-10-CM | POA: Diagnosis not present

## 2014-11-17 DIAGNOSIS — M6281 Muscle weakness (generalized): Secondary | ICD-10-CM | POA: Diagnosis not present

## 2014-11-18 DIAGNOSIS — R262 Difficulty in walking, not elsewhere classified: Secondary | ICD-10-CM | POA: Diagnosis not present

## 2014-11-18 DIAGNOSIS — M545 Low back pain: Secondary | ICD-10-CM | POA: Diagnosis not present

## 2014-11-18 DIAGNOSIS — M6281 Muscle weakness (generalized): Secondary | ICD-10-CM | POA: Diagnosis not present

## 2014-11-20 DIAGNOSIS — M545 Low back pain: Secondary | ICD-10-CM | POA: Diagnosis not present

## 2014-11-20 DIAGNOSIS — R262 Difficulty in walking, not elsewhere classified: Secondary | ICD-10-CM | POA: Diagnosis not present

## 2014-11-20 DIAGNOSIS — M6281 Muscle weakness (generalized): Secondary | ICD-10-CM | POA: Diagnosis not present

## 2014-11-24 DIAGNOSIS — R262 Difficulty in walking, not elsewhere classified: Secondary | ICD-10-CM | POA: Diagnosis not present

## 2014-11-24 DIAGNOSIS — M545 Low back pain: Secondary | ICD-10-CM | POA: Diagnosis not present

## 2014-11-24 DIAGNOSIS — M6281 Muscle weakness (generalized): Secondary | ICD-10-CM | POA: Diagnosis not present

## 2014-11-25 DIAGNOSIS — R262 Difficulty in walking, not elsewhere classified: Secondary | ICD-10-CM | POA: Diagnosis not present

## 2014-11-25 DIAGNOSIS — M545 Low back pain: Secondary | ICD-10-CM | POA: Diagnosis not present

## 2014-11-25 DIAGNOSIS — M6281 Muscle weakness (generalized): Secondary | ICD-10-CM | POA: Diagnosis not present

## 2014-11-26 ENCOUNTER — Other Ambulatory Visit: Payer: Self-pay | Admitting: Internal Medicine

## 2014-11-27 DIAGNOSIS — R262 Difficulty in walking, not elsewhere classified: Secondary | ICD-10-CM | POA: Diagnosis not present

## 2014-11-27 DIAGNOSIS — M545 Low back pain: Secondary | ICD-10-CM | POA: Diagnosis not present

## 2014-11-27 DIAGNOSIS — M6281 Muscle weakness (generalized): Secondary | ICD-10-CM | POA: Diagnosis not present

## 2014-12-01 DIAGNOSIS — M6281 Muscle weakness (generalized): Secondary | ICD-10-CM | POA: Diagnosis not present

## 2014-12-01 DIAGNOSIS — M545 Low back pain: Secondary | ICD-10-CM | POA: Diagnosis not present

## 2014-12-01 DIAGNOSIS — R262 Difficulty in walking, not elsewhere classified: Secondary | ICD-10-CM | POA: Diagnosis not present

## 2014-12-02 DIAGNOSIS — M545 Low back pain: Secondary | ICD-10-CM | POA: Diagnosis not present

## 2014-12-02 DIAGNOSIS — M6281 Muscle weakness (generalized): Secondary | ICD-10-CM | POA: Diagnosis not present

## 2014-12-02 DIAGNOSIS — R262 Difficulty in walking, not elsewhere classified: Secondary | ICD-10-CM | POA: Diagnosis not present

## 2014-12-04 DIAGNOSIS — M6281 Muscle weakness (generalized): Secondary | ICD-10-CM | POA: Diagnosis not present

## 2014-12-04 DIAGNOSIS — R262 Difficulty in walking, not elsewhere classified: Secondary | ICD-10-CM | POA: Diagnosis not present

## 2014-12-04 DIAGNOSIS — M545 Low back pain: Secondary | ICD-10-CM | POA: Diagnosis not present

## 2014-12-05 DIAGNOSIS — H35363 Drusen (degenerative) of macula, bilateral: Secondary | ICD-10-CM | POA: Diagnosis not present

## 2014-12-05 DIAGNOSIS — H25013 Cortical age-related cataract, bilateral: Secondary | ICD-10-CM | POA: Diagnosis not present

## 2014-12-08 DIAGNOSIS — M6281 Muscle weakness (generalized): Secondary | ICD-10-CM | POA: Diagnosis not present

## 2014-12-08 DIAGNOSIS — R262 Difficulty in walking, not elsewhere classified: Secondary | ICD-10-CM | POA: Diagnosis not present

## 2014-12-08 DIAGNOSIS — M545 Low back pain: Secondary | ICD-10-CM | POA: Diagnosis not present

## 2014-12-08 DIAGNOSIS — M5416 Radiculopathy, lumbar region: Secondary | ICD-10-CM | POA: Diagnosis not present

## 2014-12-08 DIAGNOSIS — M5136 Other intervertebral disc degeneration, lumbar region: Secondary | ICD-10-CM | POA: Diagnosis not present

## 2014-12-08 DIAGNOSIS — M461 Sacroiliitis, not elsewhere classified: Secondary | ICD-10-CM | POA: Diagnosis not present

## 2014-12-11 DIAGNOSIS — M461 Sacroiliitis, not elsewhere classified: Secondary | ICD-10-CM | POA: Diagnosis not present

## 2014-12-11 DIAGNOSIS — M5416 Radiculopathy, lumbar region: Secondary | ICD-10-CM | POA: Diagnosis not present

## 2014-12-11 DIAGNOSIS — M5136 Other intervertebral disc degeneration, lumbar region: Secondary | ICD-10-CM | POA: Diagnosis not present

## 2014-12-12 DIAGNOSIS — M461 Sacroiliitis, not elsewhere classified: Secondary | ICD-10-CM | POA: Diagnosis not present

## 2015-01-14 DIAGNOSIS — H43811 Vitreous degeneration, right eye: Secondary | ICD-10-CM | POA: Diagnosis not present

## 2015-01-19 DIAGNOSIS — M5136 Other intervertebral disc degeneration, lumbar region: Secondary | ICD-10-CM | POA: Diagnosis not present

## 2015-01-19 DIAGNOSIS — M5416 Radiculopathy, lumbar region: Secondary | ICD-10-CM | POA: Diagnosis not present

## 2015-01-19 DIAGNOSIS — M461 Sacroiliitis, not elsewhere classified: Secondary | ICD-10-CM | POA: Diagnosis not present

## 2015-02-18 ENCOUNTER — Ambulatory Visit (INDEPENDENT_AMBULATORY_CARE_PROVIDER_SITE_OTHER): Payer: Medicare Other | Admitting: Internal Medicine

## 2015-02-18 ENCOUNTER — Encounter: Payer: Self-pay | Admitting: Internal Medicine

## 2015-02-18 VITALS — BP 118/78 | HR 67 | Temp 97.8°F | Ht <= 58 in | Wt 109.0 lb

## 2015-02-18 DIAGNOSIS — I341 Nonrheumatic mitral (valve) prolapse: Secondary | ICD-10-CM

## 2015-02-18 DIAGNOSIS — G8929 Other chronic pain: Secondary | ICD-10-CM

## 2015-02-18 DIAGNOSIS — J4520 Mild intermittent asthma, uncomplicated: Secondary | ICD-10-CM

## 2015-02-18 DIAGNOSIS — M549 Dorsalgia, unspecified: Secondary | ICD-10-CM

## 2015-02-18 DIAGNOSIS — Z7189 Other specified counseling: Secondary | ICD-10-CM

## 2015-02-18 DIAGNOSIS — Z0000 Encounter for general adult medical examination without abnormal findings: Secondary | ICD-10-CM | POA: Insufficient documentation

## 2015-02-18 DIAGNOSIS — E785 Hyperlipidemia, unspecified: Secondary | ICD-10-CM | POA: Diagnosis not present

## 2015-02-18 DIAGNOSIS — Z23 Encounter for immunization: Secondary | ICD-10-CM | POA: Diagnosis not present

## 2015-02-18 DIAGNOSIS — R809 Proteinuria, unspecified: Secondary | ICD-10-CM | POA: Diagnosis not present

## 2015-02-18 DIAGNOSIS — Z1211 Encounter for screening for malignant neoplasm of colon: Secondary | ICD-10-CM

## 2015-02-18 DIAGNOSIS — E041 Nontoxic single thyroid nodule: Secondary | ICD-10-CM | POA: Diagnosis not present

## 2015-02-18 DIAGNOSIS — M810 Age-related osteoporosis without current pathological fracture: Secondary | ICD-10-CM

## 2015-02-18 LAB — CBC WITH DIFFERENTIAL/PLATELET
BASOS PCT: 0.6 % (ref 0.0–3.0)
Basophils Absolute: 0 10*3/uL (ref 0.0–0.1)
EOS PCT: 1.8 % (ref 0.0–5.0)
Eosinophils Absolute: 0.1 10*3/uL (ref 0.0–0.7)
HEMATOCRIT: 40.5 % (ref 36.0–46.0)
Hemoglobin: 13.4 g/dL (ref 12.0–15.0)
LYMPHS PCT: 22.2 % (ref 12.0–46.0)
Lymphs Abs: 1.6 10*3/uL (ref 0.7–4.0)
MCHC: 33.1 g/dL (ref 30.0–36.0)
MCV: 89.4 fl (ref 78.0–100.0)
MONOS PCT: 7.4 % (ref 3.0–12.0)
Monocytes Absolute: 0.5 10*3/uL (ref 0.1–1.0)
Neutro Abs: 5 10*3/uL (ref 1.4–7.7)
Neutrophils Relative %: 68 % (ref 43.0–77.0)
Platelets: 279 10*3/uL (ref 150.0–400.0)
RBC: 4.53 Mil/uL (ref 3.87–5.11)
RDW: 12.5 % (ref 11.5–15.5)
WBC: 7.4 10*3/uL (ref 4.0–10.5)

## 2015-02-18 LAB — COMPREHENSIVE METABOLIC PANEL
ALK PHOS: 47 U/L (ref 39–117)
ALT: 18 U/L (ref 0–35)
AST: 20 U/L (ref 0–37)
Albumin: 4.4 g/dL (ref 3.5–5.2)
BUN: 15 mg/dL (ref 6–23)
CHLORIDE: 101 meq/L (ref 96–112)
CO2: 31 meq/L (ref 19–32)
Calcium: 9.9 mg/dL (ref 8.4–10.5)
Creatinine, Ser: 0.68 mg/dL (ref 0.40–1.20)
GFR: 90.98 mL/min (ref >60.00)
GLUCOSE: 105 mg/dL — AB (ref 70–99)
POTASSIUM: 4.2 meq/L (ref 3.5–5.1)
SODIUM: 137 meq/L (ref 135–145)
TOTAL PROTEIN: 7.8 g/dL (ref 6.0–8.3)
Total Bilirubin: 0.6 mg/dL (ref 0.2–1.2)

## 2015-02-18 LAB — MICROALBUMIN / CREATININE URINE RATIO
Creatinine,U: 71.7 mg/dL
Microalb Creat Ratio: 1 mg/g (ref 0.0–30.0)
Microalb, Ur: <0.7 mg/dL (ref 0.0–1.9)

## 2015-02-18 LAB — LIPID PANEL
CHOL/HDL RATIO: 3
Cholesterol: 182 mg/dL (ref 0–200)
HDL: 62.4 mg/dL (ref >39.00)
LDL CALC: 83 mg/dL (ref 0–99)
NonHDL: 119.23
TRIGLYCERIDES: 183 mg/dL — AB (ref 0.0–149.0)
VLDL: 36.6 mg/dL (ref 0.0–40.0)

## 2015-02-18 LAB — T4, FREE: Free T4: 0.79 ng/dL (ref 0.60–1.60)

## 2015-02-18 LAB — TSH: TSH: 2.47 u[IU]/mL (ref 0.35–4.50)

## 2015-02-18 MED ORDER — ATORVASTATIN CALCIUM 10 MG PO TABS: 10.0000 mg | ORAL_TABLET | Freq: Every day | ORAL | Status: DC

## 2015-02-18 MED ORDER — MONTELUKAST SODIUM 10 MG PO TABS: 10.0000 mg | ORAL_TABLET | Freq: Every day | ORAL | Status: DC

## 2015-02-18 MED ORDER — ATENOLOL 25 MG PO TABS: 25.0000 mg | ORAL_TABLET | Freq: Every day | ORAL | Status: DC

## 2015-02-18 NOTE — Addendum Note (Signed)
Addended by: Despina Hidden on: 02/18/2015 10:58 AM   Modules accepted: Orders

## 2015-02-18 NOTE — Addendum Note (Signed)
Addended by: Viviana Simpler I on: 02/18/2015 10:31 AM   Modules accepted: Orders

## 2015-02-18 NOTE — Progress Notes (Signed)
Subjective:    Patient ID: Shelby Oliver, female    DOB: Oct 01, 1945, 69 y.o.   MRN: [id number]  HPI Here for Medicare wellness and follow up of chronic medical conditions Reviewed form and advanced directives Reviewed other doctors No alcohol or tobacco Usually walks but limited lately Some trouble with vision--- due for recheck Hearing is okay Independent with instrumental ADLs No apparent cognitive problems No falls No depression or anhedonia  Wondering about mammogram and ultrasound---had this due to dense breasts Discussed that she should just get a screening mammogram and go from there  Reviewed her osteoporosis Had trouble with fosamax with esophagus Originally put on estrogen Then on actonel--for 7 years. Off for 4 years now May have been on evista but didn't like this Has dietary calcium and vitamin D daily Reviewed 2014 DEXA---actually down to mostly osteopenia  Seeing Dr Sharlet Salina for recurrence of sacroiliitis Keeping her from walking much Did some injections--may have helped some Now with more thoracic pain--especially if leaning over (like ironing) Some pectus excavatum also  Can't swim due to bronchospasm with chemicals, like chlorine Mostly quiet on the singulair Not needing inhaler lately  Not using the omeprazole at all now Will get occasional swallowing issues or water brash No regular problems though  Past palpitations On atenolol for PVCs and other benign arrhythmias This keeps it controlled---related to MVP  Past proteinuria Some question of renal disease in family Scan showed decreased renal function-but this improved  Past known thyroid nodules No aspiration done in past  Current Outpatient Prescriptions on File Prior to Visit  Medication Sig Dispense Refill  . albuterol (PROVENTIL HFA;VENTOLIN HFA) 108 (90 BASE) MCG/ACT inhaler Inhale 2 puffs into the lungs every 6 (six) hours as needed for wheezing or shortness of breath. 1 Inhaler 0    . omeprazole (PRILOSEC) 20 MG capsule Take 20 mg by mouth daily as needed.     No current facility-administered medications on file prior to visit.    Allergies  Allergen Reactions  . Penicillins   . Quinolones     Past Medical History  Diagnosis Date  . Hyperlipidemia   . Mild intermittent asthma   . MVP (mitral valve prolapse)     with slight regurgitation  . Hepatic hemangioma   . Pulmonary nodule 2000's    stable over years--no more testing  . Thyroid nodule   . Proteinuria   . Osteoporosis   . Erosive esophagitis   . Gallbladder polyp   . Sleep disturbance     chronic    Past Surgical History  Procedure Laterality Date  . Carpal tunnel release Bilateral 1987  . Shoulder adhesion release Bilateral 1988, 2001  . Appendectomy    . Tonsillectomy      Family History  Problem Relation Age of Onset  . Cancer Mother   . Hyperlipidemia Mother   . Arthritis Mother   . Hypertension Mother   . Stroke Father     hemorrhagic  . Arthritis Father   . Heart disease Sister     mitral valve repair  . Hypertension Sister   . Diabetes Neg Hx     Social History   Social History  . Marital Status: Single    Spouse Name: N/A  . Number of Children: 0  . Years of Education: N/A   Occupational History  . Cytogeneticist     Retired   Social History Main Topics  . Smoking status: Never Smoker   .  Smokeless tobacco: Never Used  . Alcohol Use: No  . Drug Use: No  . Sexual Activity: Not on file   Other Topics Concern  . Not on file   Social History Narrative   Wants to be called Shelby Oliver   Has living will   [name] and brother are her health care POA   Would accept resuscitation   No tube feeds if cognitively unaware   Review of Systems Has gained 13# due to not walking much Wears seat belt--doesn't drive though Teeth okay--- overdue for dentist Appetite is okay Still doesn't sleep well--no Rx for this (nothing has helped) Mild  hand aches and pains No skin issues     Objective:   Physical Exam  Constitutional: She is oriented to person, place, and time. She appears well-developed. No distress.  HENT:  Mouth/Throat: Oropharynx is clear and moist. No oropharyngeal exudate.  Neck: Normal range of motion. Neck supple. No thyromegaly present.  Cardiovascular: Normal rate, regular rhythm, normal heart sounds and intact distal pulses.  Exam reveals no gallop.   No murmur heard. Pulmonary/Chest: Effort normal and breath sounds normal. No respiratory distress. She has no wheezes. She has no rales.  Abdominal: Soft. There is no tenderness.  Musculoskeletal: She exhibits no edema or tenderness.  SLR negative Normal ROM of both hips  Lymphadenopathy:    She has no cervical adenopathy.  Neurological: She is alert and oriented to person, place, and time.  President-- "Elyn Peers, [place], [place]" [phone] D-l-o-r-w Recall 3/3  Skin: No rash noted. No erythema.  Psychiatric: She has a normal mood and affect. Her behavior is normal.          Assessment & Plan:

## 2015-02-18 NOTE — Assessment & Plan Note (Signed)
Will check again Renal consult if very high

## 2015-02-18 NOTE — Assessment & Plan Note (Signed)
I have personally reviewed the Medicare Annual Wellness questionnaire and have noted 1. The patient's medical and social history 2. Their use of alcohol, tobacco or illicit drugs 3. Their current medications and supplements 4. The patient's functional ability including ADL's, fall risks, home safety risks and hearing or visual             impairment. 5. Diet and physical activities 6. Evidence for depression or mood disorders  The patients weight, height, BMI and visual acuity have been recorded in the chart I have made referrals, counseling and provided education to the patient based review of the above and I have provided the pt with a written personalized care plan for preventive services.  I have provided you with a copy of your personalized plan for preventive services. Please take the time to review along with your updated medication list.  Td and flu vaccine today Due for mammogram Will do fecal immunoassay--had very hard time with colonoscopy in past

## 2015-02-18 NOTE — Assessment & Plan Note (Signed)
None palpated No intervention at this point

## 2015-02-18 NOTE — Assessment & Plan Note (Signed)
Slight click but no murmur Symptom control with atenolol

## 2015-02-18 NOTE — Assessment & Plan Note (Signed)
Redoing POA in [place] now

## 2015-02-18 NOTE — Assessment & Plan Note (Signed)
She will go back to Dr Sharlet Salina

## 2015-02-18 NOTE — Assessment & Plan Note (Signed)
Will plan to recheck DEXA later--- ?age 69 Reconsider another course of bisphosphonate then

## 2015-02-18 NOTE — Assessment & Plan Note (Signed)
Still satisfied with primary prevention 

## 2015-02-18 NOTE — Progress Notes (Signed)
Pre visit review using our clinic review tool, if applicable. No additional management support is needed unless otherwise documented below in the visit note. 

## 2015-02-18 NOTE — Assessment & Plan Note (Signed)
Fair control with montelukast

## 2015-02-25 DIAGNOSIS — H43811 Vitreous degeneration, right eye: Secondary | ICD-10-CM | POA: Diagnosis not present

## 2015-07-28 ENCOUNTER — Ambulatory Visit (INDEPENDENT_AMBULATORY_CARE_PROVIDER_SITE_OTHER): Payer: Medicare Other | Admitting: Internal Medicine

## 2015-07-28 ENCOUNTER — Encounter: Payer: Self-pay | Admitting: Internal Medicine

## 2015-07-28 VITALS — BP 124/86 | HR 64 | Temp 97.5°F | Wt 112.5 lb

## 2015-07-28 DIAGNOSIS — Z1211 Encounter for screening for malignant neoplasm of colon: Secondary | ICD-10-CM

## 2015-07-28 DIAGNOSIS — K208 Other esophagitis: Secondary | ICD-10-CM

## 2015-07-28 DIAGNOSIS — R1011 Right upper quadrant pain: Secondary | ICD-10-CM | POA: Diagnosis not present

## 2015-07-28 DIAGNOSIS — K2210 Ulcer of esophagus without bleeding: Secondary | ICD-10-CM

## 2015-07-28 NOTE — Progress Notes (Signed)
Subjective:    Patient ID: Shelby Oliver, female    DOB: 12/24/1945, 70 y.o.   MRN: [id number]  HPI Here due to abdominal pain  Having RUQ pain  Has come and gone over time Gets tenderness, fullness and discomfort Seems to get pressure after bending over Last spell was about a year ago---and goes back several years No postprandial symptoms  Gets throbbing sensation when lying down Had ultrasound ~12 years ago--hemangioma in liver, polyps in gallbladder Then had MRI-- confirmed just hemangiomas No changes on other ultrasounds since--but last done 2014  Has known hiatal hernia Does have reflux symptoms--- but better off yogurt Omeprazole didn't help--just uses gaviscon (close to daily at times) Severe GERD in past--not as bad now Also CCB for esophageal spasms---did have esophageal dilation and Rx for H pylori Tries to eat small frequent light meals  Current Outpatient Prescriptions on File Prior to Visit  Medication Sig Dispense Refill  . albuterol (PROVENTIL HFA;VENTOLIN HFA) 108 (90 BASE) MCG/ACT inhaler Inhale 2 puffs into the lungs every 6 (six) hours as needed for wheezing or shortness of breath. 1 Inhaler 0  . atenolol (TENORMIN) 25 MG tablet Take 1 tablet (25 mg total) by mouth daily. 90 tablet 3  . atorvastatin (LIPITOR) 10 MG tablet Take 1 tablet (10 mg total) by mouth daily. 90 tablet 3  . Cholecalciferol (VITAMIN D3) 1000 UNITS CAPS Take 2 capsules by mouth daily.     . montelukast (SINGULAIR) 10 MG tablet Take 1 tablet (10 mg total) by mouth daily. 90 tablet 3  . omeprazole (PRILOSEC) 20 MG capsule Take 20 mg by mouth daily as needed.     No current facility-administered medications on file prior to visit.    Allergies  Allergen Reactions  . Penicillins   . Quinolones     Past Medical History  Diagnosis Date  . Hyperlipidemia   . Mild intermittent asthma   . MVP (mitral valve prolapse)     with slight regurgitation  . Hepatic hemangioma   . Pulmonary  nodule 2000's    stable over years--no more testing  . Thyroid nodule   . Proteinuria   . Osteoporosis   . Erosive esophagitis   . Gallbladder polyp   . Sleep disturbance     chronic    Past Surgical History  Procedure Laterality Date  . Carpal tunnel release Bilateral 1987  . Shoulder adhesion release Bilateral 1988, 2001  . Appendectomy    . Tonsillectomy      Family History  Problem Relation Age of Onset  . Cancer Mother   . Hyperlipidemia Mother   . Arthritis Mother   . Hypertension Mother   . Stroke Father     hemorrhagic  . Arthritis Father   . Heart disease Sister     mitral valve repair  . Hypertension Sister   . Diabetes Neg Hx     Social History   Social History  . Marital Status: Single    Spouse Name: N/A  . Number of Children: 0  . Years of Education: N/A   Occupational History  . Cytogeneticist     Retired   Social History Main Topics  . Smoking status: Never Smoker   . Smokeless tobacco: Never Used  . Alcohol Use: No  . Drug Use: No  . Sexual Activity: Not on file   Other Topics Concern  . Not on file   Social History Narrative   Wants to be  called [name]   Has living will   [name] and brother are her health care POA   Would accept resuscitation   No tube feeds if cognitively unaware   Review of Systems Weight continues to go up some No SOB Intermittent irritant cough No swallowing problems lately    Objective:   Physical Exam  Constitutional: She appears well-nourished. No distress.  Neck: Normal range of motion. Neck supple. No thyromegaly present.  Cardiovascular: Normal rate, regular rhythm and normal heart sounds.  Exam reveals no gallop.   No murmur heard. Pulmonary/Chest: Effort normal and breath sounds normal. No respiratory distress. She has no wheezes. She has no rales.  Abdominal: Soft. Bowel sounds are normal. She exhibits no distension and no mass. There is no tenderness. There is no  rebound and no guarding.  No HSM either  Musculoskeletal: She exhibits no edema.  Lymphadenopathy:    She has no cervical adenopathy.  Psychiatric:  Anxious about this but mood okay          Assessment & Plan:

## 2015-07-28 NOTE — Patient Instructions (Signed)
Try famotidine 20mg  twice a day for the acid symptoms. Let me know if the pain gets bad again.

## 2015-07-28 NOTE — Assessment & Plan Note (Signed)
Has some ongoing symptoms but no help with PPI now Will try famotidine

## 2015-07-28 NOTE — Assessment & Plan Note (Signed)
Known gallbladder polyp in past as well as hepatic hemangioma Pain with leaning over doesn't really seem intraabdominal though Ongoing reflux issues but this seems separate Bowels stable--- doesn't want colonoscopy so will do FIT If pain recurs, consider repeat abdominal ultrasound (reviewed her past tests today)

## 2015-07-28 NOTE — Progress Notes (Signed)
Pre visit review using our clinic review tool, if applicable. No additional management support is needed unless otherwise documented below in the visit note. 

## 2015-08-07 ENCOUNTER — Other Ambulatory Visit (INDEPENDENT_AMBULATORY_CARE_PROVIDER_SITE_OTHER): Payer: Medicare Other

## 2015-08-07 DIAGNOSIS — Z1211 Encounter for screening for malignant neoplasm of colon: Secondary | ICD-10-CM | POA: Diagnosis not present

## 2015-08-07 LAB — FECAL OCCULT BLOOD, IMMUNOCHEMICAL: Fecal Occult Bld: NEGATIVE

## 2015-10-12 DIAGNOSIS — H2513 Age-related nuclear cataract, bilateral: Secondary | ICD-10-CM | POA: Diagnosis not present

## 2015-12-23 DIAGNOSIS — D210 Benign neoplasm of connective and other soft tissue of head, face and neck: Secondary | ICD-10-CM | POA: Diagnosis not present

## 2015-12-23 DIAGNOSIS — L718 Other rosacea: Secondary | ICD-10-CM | POA: Diagnosis not present

## 2015-12-23 DIAGNOSIS — L239 Allergic contact dermatitis, unspecified cause: Secondary | ICD-10-CM | POA: Diagnosis not present

## 2016-02-02 ENCOUNTER — Other Ambulatory Visit: Payer: Self-pay | Admitting: Internal Medicine

## 2016-02-24 ENCOUNTER — Ambulatory Visit (INDEPENDENT_AMBULATORY_CARE_PROVIDER_SITE_OTHER): Payer: Medicare Other | Admitting: Internal Medicine

## 2016-02-24 ENCOUNTER — Encounter: Payer: Self-pay | Admitting: Internal Medicine

## 2016-02-24 VITALS — BP 120/82 | HR 64 | Temp 97.6°F | Ht <= 58 in | Wt 114.0 lb

## 2016-02-24 DIAGNOSIS — G8929 Other chronic pain: Secondary | ICD-10-CM

## 2016-02-24 DIAGNOSIS — E785 Hyperlipidemia, unspecified: Secondary | ICD-10-CM

## 2016-02-24 DIAGNOSIS — Z0000 Encounter for general adult medical examination without abnormal findings: Secondary | ICD-10-CM

## 2016-02-24 DIAGNOSIS — L239 Allergic contact dermatitis, unspecified cause: Secondary | ICD-10-CM | POA: Diagnosis not present

## 2016-02-24 DIAGNOSIS — Z7189 Other specified counseling: Secondary | ICD-10-CM

## 2016-02-24 DIAGNOSIS — I341 Nonrheumatic mitral (valve) prolapse: Secondary | ICD-10-CM | POA: Diagnosis not present

## 2016-02-24 DIAGNOSIS — J4520 Mild intermittent asthma, uncomplicated: Secondary | ICD-10-CM | POA: Diagnosis not present

## 2016-02-24 DIAGNOSIS — M810 Age-related osteoporosis without current pathological fracture: Secondary | ICD-10-CM | POA: Diagnosis not present

## 2016-02-24 DIAGNOSIS — L718 Other rosacea: Secondary | ICD-10-CM | POA: Diagnosis not present

## 2016-02-24 DIAGNOSIS — Z23 Encounter for immunization: Secondary | ICD-10-CM

## 2016-02-24 DIAGNOSIS — Z1211 Encounter for screening for malignant neoplasm of colon: Secondary | ICD-10-CM

## 2016-02-24 DIAGNOSIS — M549 Dorsalgia, unspecified: Secondary | ICD-10-CM

## 2016-02-24 LAB — COMPREHENSIVE METABOLIC PANEL
ALBUMIN: 4.5 g/dL (ref 3.5–5.2)
ALK PHOS: 52 U/L (ref 39–117)
ALT: 14 U/L (ref 0–35)
AST: 16 U/L (ref 0–37)
BILIRUBIN TOTAL: 0.8 mg/dL (ref 0.2–1.2)
BUN: 18 mg/dL (ref 6–23)
CALCIUM: 10 mg/dL (ref 8.4–10.5)
CO2: 30 mEq/L (ref 19–32)
CREATININE: 0.71 mg/dL (ref 0.40–1.20)
Chloride: 101 mEq/L (ref 96–112)
GFR: 86.3 mL/min (ref >60.00)
Glucose, Bld: 104 mg/dL — ABNORMAL HIGH (ref 70–99)
Potassium: 4 mEq/L (ref 3.5–5.1)
Sodium: 138 mEq/L (ref 135–145)
Total Protein: 8 g/dL (ref 6.0–8.3)

## 2016-02-24 LAB — CBC WITH DIFFERENTIAL/PLATELET
BASOS ABS: 0 10*3/uL (ref 0.0–0.1)
Basophils Relative: 0.4 % (ref 0.0–3.0)
EOS ABS: 0.2 10*3/uL (ref 0.0–0.7)
Eosinophils Relative: 2 % (ref 0.0–5.0)
HEMATOCRIT: 39.4 % (ref 36.0–46.0)
HEMOGLOBIN: 13.5 g/dL (ref 12.0–15.0)
LYMPHS PCT: 20.5 % (ref 12.0–46.0)
Lymphs Abs: 1.7 10*3/uL (ref 0.7–4.0)
MCHC: 34.3 g/dL (ref 30.0–36.0)
MCV: 87.1 fl (ref 78.0–100.0)
Monocytes Absolute: 0.7 10*3/uL (ref 0.1–1.0)
Monocytes Relative: 8 % (ref 3.0–12.0)
Neutro Abs: 5.7 10*3/uL (ref 1.4–7.7)
Neutrophils Relative %: 69.1 % (ref 43.0–77.0)
PLATELETS: 275 10*3/uL (ref 150.0–400.0)
RBC: 4.52 Mil/uL (ref 3.87–5.11)
RDW: 12.6 % (ref 11.5–15.5)
WBC: 8.2 10*3/uL (ref 4.0–10.5)

## 2016-02-24 LAB — LIPID PANEL
CHOLESTEROL: 186 mg/dL (ref 0–200)
HDL: 55.3 mg/dL (ref >39.00)
LDL CALC: 91 mg/dL (ref 0–99)
NonHDL: 131.12
TRIGLYCERIDES: 199 mg/dL — AB (ref 0.0–149.0)
Total CHOL/HDL Ratio: 3
VLDL: 39.8 mg/dL (ref 0.0–40.0)

## 2016-02-24 LAB — T4, FREE: Free T4: 0.77 ng/dL (ref 0.60–1.60)

## 2016-02-24 MED ORDER — ALBUTEROL SULFATE HFA 108 (90 BASE) MCG/ACT IN AERS: 2.0000 | INHALATION_SPRAY | Freq: Four times a day (QID) | RESPIRATORY_TRACT | 1 refills | Status: DC | PRN

## 2016-02-24 MED ORDER — METOPROLOL SUCCINATE ER 25 MG PO TB24: 25.0000 mg | ORAL_TABLET | Freq: Every day | ORAL | 3 refills | Status: DC

## 2016-02-24 NOTE — Assessment & Plan Note (Signed)
I have personally reviewed the Medicare Annual Wellness questionnaire and have noted 1. The patient's medical and social history 2. Their use of alcohol, tobacco or illicit drugs 3. Their current medications and supplements 4. The patient's functional ability including ADL's, fall risks, home safety risks and hearing or visual             impairment. 5. Diet and physical activities 6. Evidence for depression or mood disorders  The patients weight, height, BMI and visual acuity have been recorded in the chart I have made referrals, counseling and provided education to the patient based review of the above and I have provided the pt with a written personalized care plan for preventive services.  I have provided you with a copy of your personalized plan for preventive services. Please take the time to review along with your updated medication list.  Will do FIT Not sure about mammogram--she will consider Flu vaccine today Discussed increasing fitness efforts

## 2016-02-24 NOTE — Assessment & Plan Note (Signed)
Still waiting on attorney for formal [place] document

## 2016-02-24 NOTE — Assessment & Plan Note (Signed)
No symptoms on beta blocker Atenolol not available--change to metoprolol

## 2016-02-24 NOTE — Assessment & Plan Note (Signed)
sacroillitis is somewhat better Not seeing Dr Sharlet Salina now

## 2016-02-24 NOTE — Progress Notes (Signed)
Subjective:    Patient ID: Shelby Oliver, female    DOB: November 19, 1945, 70 y.o.   MRN: [id number]  HPI Here for Medicare wellness and follow up of chronic health conditions Reviewed form and advanced directives Reviewed other doctors Walks regularly--discussed doing resistance training (for her bones) No alcohol or tobacco Vision still giving her problems---keeping up with eye doctor (vitreous detachment with floaters) Hearing is fine No falls No depression or anhedonia Does exercise Independent with instrumental ADLs Memory is okay  Still has to avoid certain activities Sacroiliitis still flares at times Hasn't seen Dr Sharlet Salina since the injections---not really helpful No recent problems with sciatica  Stomach is better No longer taking anything for the GERD Just has to be careful with what she eats Slight soreness in RUQ if she bends over, etc. Could be related to chronic chest wall pain (?related to pectus)  Asthma basically quiet Just has to avoid chemicals--like chlorine, etc Uses the inhaler intermittently  No recent palpitations Does get spells at times No chest pain No SOB (other than with chemicals)  Current Outpatient Prescriptions on File Prior to Visit  Medication Sig Dispense Refill  . albuterol (PROVENTIL HFA;VENTOLIN HFA) 108 (90 BASE) MCG/ACT inhaler Inhale 2 puffs into the lungs every 6 (six) hours as needed for wheezing or shortness of breath. 1 Inhaler 0  . atenolol (TENORMIN) 25 MG tablet TAKE 1 TABLET(25 MG) BY MOUTH DAILY 90 tablet 0  . atorvastatin (LIPITOR) 10 MG tablet TAKE 1 TABLET(10 MG) BY MOUTH DAILY 90 tablet 0  . Cholecalciferol (VITAMIN D3) 1000 UNITS CAPS Take 2 capsules by mouth daily.     . montelukast (SINGULAIR) 10 MG tablet Take 1 tablet (10 mg total) by mouth daily. 90 tablet 3   No current facility-administered medications on file prior to visit.     Allergies  Allergen Reactions  . Penicillins   . Quinolones     Past  Medical History:  Diagnosis Date  . Erosive esophagitis   . Gallbladder polyp   . Hepatic hemangioma   . Hyperlipidemia   . Mild intermittent asthma   . MVP (mitral valve prolapse)    with slight regurgitation  . Osteoporosis   . Proteinuria   . Pulmonary nodule 2000's   stable over years--no more testing  . Sleep disturbance    chronic  . Thyroid nodule     Past Surgical History:  Procedure Laterality Date  . APPENDECTOMY    . CARPAL TUNNEL RELEASE Bilateral 1987  . SHOULDER ADHESION RELEASE Bilateral 1988, 2001  . TONSILLECTOMY      Family History  Problem Relation Age of Onset  . Cancer Mother   . Hyperlipidemia Mother   . Arthritis Mother   . Hypertension Mother   . Stroke Father     hemorrhagic  . Arthritis Father   . Heart disease Sister     mitral valve repair  . Hypertension Sister   . Diabetes Neg Hx     Social History   Social History  . Marital status: Single    Spouse name: N/A  . Number of children: 0  . Years of education: N/A   Occupational History  . Cytogeneticist     Retired   Social History Main Topics  . Smoking status: Never Smoker  . Smokeless tobacco: Never Used  . Alcohol use No  . Drug use: No  . Sexual activity: Not on file   Other Topics Concern  .  Not on file   Social History Narrative   Wants to be called Shelby Oliver   Has living will   [name] and brother are her health care POA. Needs to do in [place]--requests nieces to do this [name], [name])   Would accept resuscitation   No tube feeds if cognitively unaware   Review of Systems Tries to eat a healthy diet--cooks all her own food. Skin milk Weight up another 5#--she is very concerned about this Still doesn't sleep well--initiates well but awakens. May nap in day if not busy Wears seat belt--doesn't drive Teeth okay--- keeps up with Dr Adair Patter Bowels are okay. No blood Voids okay--but will have a small leakage  after standing. No blood Dry skin and gets easily irritated. Did see Dr Rosiland Oz cream helped Easy bruising No sig joint swelling or pain. Some shoulder problems chronically    Objective:   Physical Exam  Constitutional: She is oriented to person, place, and time. She appears well-developed and well-nourished. No distress.  HENT:  Mouth/Throat: Oropharynx is clear and moist. No oropharyngeal exudate.  Neck: Normal range of motion. Neck supple. No thyromegaly present.  Cardiovascular: Normal rate, regular rhythm, normal heart sounds and intact distal pulses.  Exam reveals no gallop.   No murmur heard. Pulmonary/Chest: Effort normal and breath sounds normal. No respiratory distress. She has no wheezes. She has no rales.  Abdominal: Soft. There is no tenderness.  Lymphadenopathy:    She has no cervical adenopathy.  Neurological: She is alert and oriented to person, place, and time.  President-- "Dwaine Deter, [place]" [phone] D-l-r-o-w Recall 3/3  Skin: No rash noted. No erythema.  Psychiatric: She has a normal mood and affect. Her behavior is normal.          Assessment & Plan:

## 2016-02-24 NOTE — Assessment & Plan Note (Signed)
Okay with primary prevention

## 2016-02-24 NOTE — Assessment & Plan Note (Signed)
Does okay with the singulair and prn albuterol

## 2016-02-24 NOTE — Assessment & Plan Note (Signed)
Regular weight bearing exercise Calcium in diet. On vitamin D Will plan DEXA in a few years

## 2016-02-24 NOTE — Addendum Note (Signed)
Addended by: Pilar Grammes on: 02/24/2016 10:43 AM   Modules accepted: Orders

## 2016-02-24 NOTE — Progress Notes (Signed)
Pre visit review using our clinic review tool, if applicable. No additional management support is needed unless otherwise documented below in the visit note. 

## 2016-02-24 NOTE — Patient Instructions (Signed)
You can still consider a screening mammogram--you can set up this on your own.

## 2016-03-11 DIAGNOSIS — M5136 Other intervertebral disc degeneration, lumbar region: Secondary | ICD-10-CM | POA: Diagnosis not present

## 2016-03-11 DIAGNOSIS — M461 Sacroiliitis, not elsewhere classified: Secondary | ICD-10-CM | POA: Diagnosis not present

## 2016-03-11 DIAGNOSIS — M5416 Radiculopathy, lumbar region: Secondary | ICD-10-CM | POA: Diagnosis not present

## 2016-03-17 DIAGNOSIS — M461 Sacroiliitis, not elsewhere classified: Secondary | ICD-10-CM | POA: Diagnosis not present

## 2016-05-24 ENCOUNTER — Other Ambulatory Visit: Payer: Self-pay | Admitting: Internal Medicine

## 2016-06-13 DIAGNOSIS — H43812 Vitreous degeneration, left eye: Secondary | ICD-10-CM | POA: Diagnosis not present

## 2016-07-19 DIAGNOSIS — H43812 Vitreous degeneration, left eye: Secondary | ICD-10-CM | POA: Diagnosis not present

## 2016-10-07 ENCOUNTER — Other Ambulatory Visit (INDEPENDENT_AMBULATORY_CARE_PROVIDER_SITE_OTHER): Payer: Medicare Other

## 2016-10-07 DIAGNOSIS — Z1211 Encounter for screening for malignant neoplasm of colon: Secondary | ICD-10-CM

## 2016-10-07 LAB — FECAL OCCULT BLOOD, IMMUNOCHEMICAL: Fecal Occult Bld: NEGATIVE

## 2016-10-31 ENCOUNTER — Telehealth: Payer: Self-pay | Admitting: Internal Medicine

## 2016-10-31 NOTE — Telephone Encounter (Signed)
Spoke to pt. She said she is going out of town and had me schedule her with Dr Silvio Pate for 11-25-16.

## 2016-10-31 NOTE — Telephone Encounter (Signed)
Pt has nodules on toe and burning sensation, hard for her to move toes, fells like the skin around it is hard.  She would like podiatry referral in [place] and wont be back in town until after July 16th.  Pt has not been seen for this, it started 3 weeks ago.  cb number is [phone]

## 2016-10-31 NOTE — Telephone Encounter (Signed)
Please let her know that due to the burning sensation, etc--it is not clear that this is a podiatry issue. Probably best to start here where I can do necessary testing to see if this is neuropathy or mechanical problem, etc

## 2016-11-25 ENCOUNTER — Encounter: Payer: Self-pay | Admitting: Internal Medicine

## 2016-11-25 ENCOUNTER — Ambulatory Visit: Payer: Medicare Other | Admitting: Internal Medicine

## 2016-11-25 ENCOUNTER — Ambulatory Visit (INDEPENDENT_AMBULATORY_CARE_PROVIDER_SITE_OTHER): Payer: Medicare Other | Admitting: Internal Medicine

## 2016-11-25 VITALS — BP 110/82 | HR 78 | Temp 97.8°F | Wt 116.0 lb

## 2016-11-25 DIAGNOSIS — M79673 Pain in unspecified foot: Secondary | ICD-10-CM | POA: Insufficient documentation

## 2016-11-25 DIAGNOSIS — M79671 Pain in right foot: Secondary | ICD-10-CM | POA: Diagnosis not present

## 2016-11-25 DIAGNOSIS — R200 Anesthesia of skin: Secondary | ICD-10-CM

## 2016-11-25 LAB — CBC WITH DIFFERENTIAL/PLATELET
BASOS ABS: 0 10*3/uL (ref 0.0–0.1)
Basophils Relative: 0.3 % (ref 0.0–3.0)
EOS ABS: 0.1 10*3/uL (ref 0.0–0.7)
Eosinophils Relative: 0.7 % (ref 0.0–5.0)
HEMATOCRIT: 38.3 % (ref 36.0–46.0)
HEMOGLOBIN: 13 g/dL (ref 12.0–15.0)
LYMPHS PCT: 15.7 % (ref 12.0–46.0)
Lymphs Abs: 1.3 10*3/uL (ref 0.7–4.0)
MCHC: 34.1 g/dL (ref 30.0–36.0)
MCV: 87.2 fl (ref 78.0–100.0)
Monocytes Absolute: 0.6 10*3/uL (ref 0.1–1.0)
Monocytes Relative: 6.5 % (ref 3.0–12.0)
Neutro Abs: 6.6 10*3/uL (ref 1.4–7.7)
Neutrophils Relative %: 76.8 % (ref 43.0–77.0)
Platelets: 275 10*3/uL (ref 150.0–400.0)
RBC: 4.39 Mil/uL (ref 3.87–5.11)
RDW: 12.4 % (ref 11.5–15.5)
WBC: 8.5 10*3/uL (ref 4.0–10.5)

## 2016-11-25 LAB — COMPREHENSIVE METABOLIC PANEL
ALBUMIN: 4.5 g/dL (ref 3.5–5.2)
ALT: 16 U/L (ref 0–35)
AST: 17 U/L (ref 0–37)
Alkaline Phosphatase: 48 U/L (ref 39–117)
BILIRUBIN TOTAL: 0.6 mg/dL (ref 0.2–1.2)
BUN: 20 mg/dL (ref 6–23)
CALCIUM: 10.1 mg/dL (ref 8.4–10.5)
CHLORIDE: 100 meq/L (ref 96–112)
CO2: 28 mEq/L (ref 19–32)
Creatinine, Ser: 0.72 mg/dL (ref 0.40–1.20)
GFR: 84.74 mL/min (ref >60.00)
Glucose, Bld: 106 mg/dL — ABNORMAL HIGH (ref 70–99)
Potassium: 4.3 mEq/L (ref 3.5–5.1)
Sodium: 136 mEq/L (ref 135–145)
Total Protein: 7.7 g/dL (ref 6.0–8.3)

## 2016-11-25 LAB — T4, FREE: Free T4: 0.72 ng/dL (ref 0.60–1.60)

## 2016-11-25 LAB — VITAMIN B12: VITAMIN B 12: 901 pg/mL (ref 211–911)

## 2016-11-25 NOTE — Patient Instructions (Signed)
Please try off the atorvastatin for 1-2 months to see if that helps the numbness. Please get high quality support shoes.

## 2016-11-25 NOTE — Progress Notes (Signed)
Subjective:    Patient ID: Shelby Oliver, female    DOB: [birth year], 71 y.o.   MRN: [id number]  HPI Here due to abnormal sensation in feet  Has been out of town in [place] BIL died--extended stay there  Started 3 months ago or so Has had cramping in right foot in the past---but then started noting prickly sensation And tight feeling in other toes Nodule between great and 2nd toes Pain if steps on it--hard to walk on it  Has had some numbness in both feet--this is in addition  Current Outpatient Prescriptions on File Prior to Visit  Medication Sig Dispense Refill  . albuterol (PROVENTIL HFA;VENTOLIN HFA) 108 (90 Base) MCG/ACT inhaler Inhale 2 puffs into the lungs every 6 (six) hours as needed for wheezing or shortness of breath. 1 Inhaler 1  . Cholecalciferol (VITAMIN D3) 1000 UNITS CAPS Take 2 capsules by mouth daily.     . metoprolol succinate (TOPROL-XL) 25 MG 24 hr tablet Take 1 tablet (25 mg total) by mouth daily. 90 tablet 3  . montelukast (SINGULAIR) 10 MG tablet TAKE 1 TABLET(10 MG) BY MOUTH DAILY 90 tablet 3   No current facility-administered medications on file prior to visit.     Allergies  Allergen Reactions  . Penicillins   . Quinolones     Past Medical History:  Diagnosis Date  . Erosive esophagitis   . Gallbladder polyp   . Hepatic hemangioma   . Hyperlipidemia   . Mild intermittent asthma   . MVP (mitral valve prolapse)    with slight regurgitation  . Osteoporosis   . Proteinuria   . Pulmonary nodule 2000's   stable over years--no more testing  . Sleep disturbance    chronic  . Thyroid nodule     Past Surgical History:  Procedure Laterality Date  . APPENDECTOMY    . CARPAL TUNNEL RELEASE Bilateral 1987  . SHOULDER ADHESION RELEASE Bilateral 1988, 2001  . TONSILLECTOMY      Family History  Problem Relation Age of Onset  . Cancer Mother   . Hyperlipidemia Mother   . Arthritis Mother   . Hypertension Mother   . Stroke Father    hemorrhagic  . Arthritis Father   . Heart disease Sister        mitral valve repair  . Hypertension Sister   . Diabetes Neg Hx     Social History   Social History  . Marital status: Single    Spouse name: N/A  . Number of children: 0  . Years of education: N/A   Occupational History  . Cytogeneticist     Retired   Social History Main Topics  . Smoking status: Never Smoker  . Smokeless tobacco: Never Used  . Alcohol use No  . Drug use: No  . Sexual activity: Not on file   Other Topics Concern  . Not on file   Social History Narrative   Wants to be called Shelby Oliver   Has living will   Shelby Oliver and brother are her health care POA. Needs to do in [place]--requests nieces to do this Shelby Oliver, Shelby Oliver, Shelby Oliver)   Would accept resuscitation   No tube feeds if cognitively unaware   Review of Systems  Chronic edema-- builds up during day. Gone in AM Does wear support hose prn No sensation changes in hands Some numbness in back and down into legs--- lasted a couple of days in [place]. Would not  be able to get comfortable     Objective:   Physical Exam  Musculoskeletal: She exhibits no edema.  Small bony prominence on right 2nd toe No mass in the web space No bony tenderness          Assessment & Plan:

## 2016-11-25 NOTE — Assessment & Plan Note (Signed)
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

## 2016-11-25 NOTE — Assessment & Plan Note (Signed)
I suspect this is mostly mechanical No obvious Morton's neuroma Discussed better support footwear

## 2016-11-30 DIAGNOSIS — H04123 Dry eye syndrome of bilateral lacrimal glands: Secondary | ICD-10-CM | POA: Diagnosis not present

## 2016-12-05 LAB — PROTEIN ELECTROPHORESIS, SERUM, WITH REFLEX

## 2017-01-30 DIAGNOSIS — H04123 Dry eye syndrome of bilateral lacrimal glands: Secondary | ICD-10-CM | POA: Diagnosis not present

## 2017-02-13 ENCOUNTER — Other Ambulatory Visit: Payer: Self-pay | Admitting: Internal Medicine

## 2017-03-01 ENCOUNTER — Encounter: Payer: Self-pay | Admitting: Internal Medicine

## 2017-03-01 ENCOUNTER — Ambulatory Visit (INDEPENDENT_AMBULATORY_CARE_PROVIDER_SITE_OTHER): Payer: Medicare Other | Admitting: Internal Medicine

## 2017-03-01 VITALS — BP 112/72 | HR 74 | Temp 97.9°F | Ht <= 58 in | Wt 118.0 lb

## 2017-03-01 DIAGNOSIS — Z7189 Other specified counseling: Secondary | ICD-10-CM | POA: Diagnosis not present

## 2017-03-01 DIAGNOSIS — Z0000 Encounter for general adult medical examination without abnormal findings: Secondary | ICD-10-CM

## 2017-03-01 DIAGNOSIS — I341 Nonrheumatic mitral (valve) prolapse: Secondary | ICD-10-CM | POA: Diagnosis not present

## 2017-03-01 DIAGNOSIS — Z23 Encounter for immunization: Secondary | ICD-10-CM

## 2017-03-01 DIAGNOSIS — K2210 Ulcer of esophagus without bleeding: Secondary | ICD-10-CM

## 2017-03-01 DIAGNOSIS — E785 Hyperlipidemia, unspecified: Secondary | ICD-10-CM | POA: Diagnosis not present

## 2017-03-01 DIAGNOSIS — J4520 Mild intermittent asthma, uncomplicated: Secondary | ICD-10-CM

## 2017-03-01 MED ORDER — RANITIDINE HCL 300 MG PO TABS: 300.0000 mg | ORAL_TABLET | Freq: Two times a day (BID) | ORAL | 3 refills | Status: DC

## 2017-03-01 NOTE — Assessment & Plan Note (Signed)
Occasional spells of palpitations She will get past echo from [place]

## 2017-03-01 NOTE — Assessment & Plan Note (Signed)
Increased symptoms again Will use high dose H2blocker---hasn't responded to PPI

## 2017-03-01 NOTE — Assessment & Plan Note (Signed)
Generally quiet No changes needed

## 2017-03-01 NOTE — Addendum Note (Signed)
Addended by: Pilar Grammes on: 03/01/2017 12:26 PM   Modules accepted: Orders

## 2017-03-01 NOTE — Assessment & Plan Note (Signed)
No problems with statin for primary prevention 

## 2017-03-01 NOTE — Progress Notes (Signed)
Subjective:    Patient ID: Shelby Oliver, female    DOB: [birth year], 71 y.o.   MRN: [id number]  HPI Here for Medicare wellness and follow up of chronic health conditions Reviewed form and advanced directives Reviewed other doctors No alcohol or tobacco Tries to exercise regularly No falls Has vision loss---delaying cataract surgery due to severe dry eye. Working on Rx for this No depression or anhedonia No apparent memory problems  Slight cold now Not bad enough for visit  Having some swallowing issues Particular food--like bread or chicken Seems to be at base of esophagus--gets feeling of fullness Using only pepcid No regurgitation or heartburn PPIs never that helpful  Notices crackling in left ear Hearing is okay  Foot pain is better Wearing better support shoes/sneakers Gets some tightness still  Asthma fairly quiet Some cough at times No wheezing or chronic cough  Does notice some SOB with exertion in the heat Rarely uses the inhaler--mostly if exposure to cleaning chemicals  Does get palpitations intermittently Happens for 2 weeks--then stops None with exertion No chest pain--other than chest wall pain No dizziness or syncope  Current Outpatient Prescriptions on File Prior to Visit  Medication Sig Dispense Refill  . albuterol (PROVENTIL HFA;VENTOLIN HFA) 108 (90 Base) MCG/ACT inhaler Inhale 2 puffs into the lungs every 6 (six) hours as needed for wheezing or shortness of breath. 1 Inhaler 1  . atorvastatin (LIPITOR) 10 MG tablet Take 1 tablet by mouth daily.  3  . Cholecalciferol (VITAMIN D3) 1000 UNITS CAPS Take 2 capsules by mouth daily.     . metoprolol succinate (TOPROL-XL) 25 MG 24 hr tablet TAKE 1 TABLET(25 MG) BY MOUTH DAILY 90 tablet 0  . montelukast (SINGULAIR) 10 MG tablet TAKE 1 TABLET(10 MG) BY MOUTH DAILY 90 tablet 3  . Olopatadine HCl (PAZEO OP) Apply to eye.     No current facility-administered medications on file prior to visit.      Allergies  Allergen Reactions  . Penicillins   . Quinolones     Past Medical History:  Diagnosis Date  . Erosive esophagitis   . Gallbladder polyp   . Hepatic hemangioma   . Hyperlipidemia   . Mild intermittent asthma   . MVP (mitral valve prolapse)    with slight regurgitation  . Osteoporosis   . Proteinuria   . Pulmonary nodule 2000's   stable over years--no more testing  . Sleep disturbance    chronic  . Thyroid nodule     Past Surgical History:  Procedure Laterality Date  . APPENDECTOMY    . CARPAL TUNNEL RELEASE Bilateral 1987  . SHOULDER ADHESION RELEASE Bilateral 1988, 2001  . TONSILLECTOMY      Family History  Problem Relation Age of Onset  . Cancer Mother   . Hyperlipidemia Mother   . Arthritis Mother   . Hypertension Mother   . Stroke Father        hemorrhagic  . Arthritis Father   . Heart disease Sister        mitral valve repair  . Hypertension Sister   . Diabetes Neg Hx     Social History   Social History  . Marital status: Single    Spouse name: N/A  . Number of children: 0  . Years of education: N/A   Occupational History  . Cytogeneticist     Retired   Social History Main Topics  . Smoking status: Never Smoker  . Smokeless tobacco: Never  Used  . Alcohol use No  . Drug use: No  . Sexual activity: Not on file   Other Topics Concern  . Not on file   Social History Narrative   Wants to be called Shelby Oliver   Has living will   Shelby Oliver and brother are her health care POA. Needs to do in [place]--requests nieces to do this Shelby Oliver, Shelby Oliver)   Would accept resuscitation   No tube feeds if cognitively unaware   Review of Systems Appetite is not big---weight up slightly Ongoing sleep problems--- trouble staying asleep is the same Wears seat belt--doesn't drive Teeth okay-- keeps up with dentist Ongoing sacroiliitis---did have injection last fall Some facial rashes--no recent  visit with Dr Evorn Gong Bowels are okay. No blood No urinary problems but does go frequently    Objective:   Physical Exam  Constitutional: She is oriented to person, place, and time. She appears well-developed. No distress.  HENT:  Mouth/Throat: Oropharynx is clear and moist. No oropharyngeal exudate.  Neck: No thyromegaly present.  Cardiovascular: Normal rate, regular rhythm, normal heart sounds and intact distal pulses.  Exam reveals no gallop.   No murmur heard. Pulmonary/Chest: Effort normal and breath sounds normal. No respiratory distress. She has no wheezes. She has no rales.  Abdominal: Soft. She exhibits no distension. There is no tenderness.  Musculoskeletal: She exhibits no edema or tenderness.  Lymphadenopathy:    She has no cervical adenopathy.  Neurological: She is alert and oriented to person, place, and time.  President--- "Shelby Oliver" [phone] [id number] Recall 3/3  Skin: No rash noted. No erythema.  Psychiatric: She has a normal mood and affect. Her behavior is normal.          Assessment & Plan:

## 2017-03-01 NOTE — Assessment & Plan Note (Signed)
I have personally reviewed the Medicare Annual Wellness questionnaire and have noted 1. The patient's medical and social history 2. Their use of alcohol, tobacco or illicit drugs 3. Their current medications and supplements 4. The patient's functional ability including ADL's, fall risks, home safety risks and hearing or visual             impairment. 5. Diet and physical activities 6. Evidence for depression or mood disorders  The patients weight, height, BMI and visual acuity have been recorded in the chart I have made referrals, counseling and provided education to the patient based review of the above and I have provided the pt with a written personalized care plan for preventive services.  I have provided you with a copy of your personalized plan for preventive services. Please take the time to review along with your updated medication list.  Prefers no mammogram--she does self exam Will repeat FIT next year No pap due to age Flu vaccine and pneumovax booster today She will get shingrix at pharmacy

## 2017-03-01 NOTE — Assessment & Plan Note (Signed)
Redoing advanced directives for [place]

## 2017-04-24 DIAGNOSIS — H2513 Age-related nuclear cataract, bilateral: Secondary | ICD-10-CM | POA: Diagnosis not present

## 2017-05-16 ENCOUNTER — Encounter: Payer: Self-pay | Admitting: Internal Medicine

## 2017-05-23 ENCOUNTER — Other Ambulatory Visit: Payer: Self-pay | Admitting: Internal Medicine

## 2017-05-25 ENCOUNTER — Other Ambulatory Visit: Payer: Self-pay | Admitting: Internal Medicine

## 2017-06-06 ENCOUNTER — Ambulatory Visit (INDEPENDENT_AMBULATORY_CARE_PROVIDER_SITE_OTHER): Payer: Medicare Other | Admitting: Internal Medicine

## 2017-06-06 ENCOUNTER — Encounter: Payer: Self-pay | Admitting: Internal Medicine

## 2017-06-06 VITALS — BP 122/66 | HR 100 | Temp 99.4°F | Wt 120.8 lb

## 2017-06-06 DIAGNOSIS — J0140 Acute pansinusitis, unspecified: Secondary | ICD-10-CM

## 2017-06-06 DIAGNOSIS — H04123 Dry eye syndrome of bilateral lacrimal glands: Secondary | ICD-10-CM

## 2017-06-06 DIAGNOSIS — H04129 Dry eye syndrome of unspecified lacrimal gland: Secondary | ICD-10-CM | POA: Insufficient documentation

## 2017-06-06 MED ORDER — AZITHROMYCIN 250 MG PO TABS: ORAL_TABLET | ORAL | 0 refills | Status: DC

## 2017-06-06 NOTE — Progress Notes (Signed)
Subjective:    Patient ID: Shelby Oliver, female    DOB: [birth year], 72 y.o.   MRN: [id number]  HPI Here due to fever and persistent respiratory infection  Started with sore throat and cough for about 4 weeks Seemed to be getting better but now worse Lots of nasal drainage Temp up to 101 --then 102 this morning Achy all over--- feels "run down" Some frontal pain--from the coughing No ear pain Not really SOB--but hasn't been doing any activity  Holding fluid---urinating a lot  Drinking tea and honey No tylenol ---fever just started  Current Outpatient Medications on File Prior to Visit  Medication Sig Dispense Refill  . albuterol (PROVENTIL HFA;VENTOLIN HFA) 108 (90 Base) MCG/ACT inhaler Inhale 2 puffs into the lungs every 6 (six) hours as needed for wheezing or shortness of breath. 1 Inhaler 1  . atorvastatin (LIPITOR) 10 MG tablet TAKE 1 TABLET(10 MG) BY MOUTH DAILY 90 tablet 3  . Cholecalciferol (VITAMIN D3) 1000 UNITS CAPS Take 2 capsules by mouth daily.     . fluorometholone (FML) 0.1 % ophthalmic suspension SHAKE LQ AND INT 1 GTT IN OU QD  3  . metoprolol succinate (TOPROL-XL) 25 MG 24 hr tablet TAKE 1 TABLET(25 MG) BY MOUTH DAILY 90 tablet 3  . montelukast (SINGULAIR) 10 MG tablet TAKE 1 TABLET(10 MG) BY MOUTH DAILY 90 tablet 3  . Olopatadine HCl (PAZEO OP) Apply to eye.    . ranitidine (ZANTAC) 300 MG tablet Take 1 tablet (300 mg total) by mouth 2 (two) times daily. 180 tablet 3  . XIIDRA 5 % SOLN INT 1 GTT IN OU BID  3   No current facility-administered medications on file prior to visit.     Allergies  Allergen Reactions  . Penicillins   . Quinolones     Past Medical History:  Diagnosis Date  . Erosive esophagitis   . Gallbladder polyp   . Hepatic hemangioma   . Hyperlipidemia   . Mild intermittent asthma   . MVP (mitral valve prolapse)    with slight regurgitation  . Osteoporosis   . Proteinuria   . Pulmonary nodule 2000's   stable over years--no  more testing  . Sleep disturbance    chronic  . Thyroid nodule     Past Surgical History:  Procedure Laterality Date  . APPENDECTOMY    . CARPAL TUNNEL RELEASE Bilateral 1987  . SHOULDER ADHESION RELEASE Bilateral 1988, 2001  . TONSILLECTOMY      Family History  Problem Relation Age of Onset  . Cancer Mother   . Hyperlipidemia Mother   . Arthritis Mother   . Hypertension Mother   . Stroke Father        hemorrhagic  . Arthritis Father   . Heart disease Sister        mitral valve repair  . Hypertension Sister   . Diabetes Neg Hx     Social History   Socioeconomic History  . Marital status: Single    Spouse name: Not on file  . Number of children: 0  . Years of education: Not on file  . Highest education level: Not on file  Social Needs  . Financial resource strain: Not on file  . Food insecurity - worry: Not on file  . Food insecurity - inability: Not on file  . Transportation needs - medical: Not on file  . Transportation needs - non-medical: Not on file  Occupational History  . Occupation: Cytogeneticist  Comment: Retired  Tobacco Use  . Smoking status: Never Smoker  . Smokeless tobacco: Never Used  Substance and Sexual Activity  . Alcohol use: No  . Drug use: No  . Sexual activity: Not on file  Other Topics Concern  . Not on file  Social History Narrative   Wants to be called Shelby Oliver   Has living will   [name] and brother are her health care POA. Needs to do in [place]--requests nieces to do this [name], [name])   Would accept resuscitation   No tube feeds if cognitively unaware   Review of Systems  No rash No vomiting or diarrhea Appetite is okay Dry eye syndrome ongoing--eye doctor suggested trying lyrica    Objective:   Physical Exam  Constitutional: No distress.  Mildly uncomfortable  HENT:  No sinus tenderness TMs normal Moderate nasal inflammation Slight pharyngeal injection  without exudate  Neck: No thyromegaly present.  Pulmonary/Chest: Effort normal and breath sounds normal. No respiratory distress. She has no wheezes. She has no rales.  Lymphadenopathy:    She has no cervical adenopathy.          Assessment & Plan:

## 2017-06-06 NOTE — Assessment & Plan Note (Signed)
Time course suggestive of atypical infection Discussed supportive care Will treat with z-pak--but discussed time course and that it should resolve eventually even if the antibiotic doesn't help

## 2017-06-06 NOTE — Assessment & Plan Note (Signed)
Okay to try lyrica or gabapentin for the pain (will Rx from Dr Edison Pace at [facility])

## 2017-06-26 DIAGNOSIS — H2513 Age-related nuclear cataract, bilateral: Secondary | ICD-10-CM | POA: Diagnosis not present

## 2017-07-06 ENCOUNTER — Telehealth: Payer: Self-pay | Admitting: *Deleted

## 2017-07-06 ENCOUNTER — Ambulatory Visit: Payer: Self-pay | Admitting: *Deleted

## 2017-07-06 NOTE — Telephone Encounter (Signed)
Spoke to pt. She saw Manuela Schwartz today. She suggested she see Dr Silvio Pate. Has appt tomorrow.

## 2017-07-06 NOTE — Telephone Encounter (Signed)
Please call her If there is nothing acute, I can make a referral for her to see a lung specialist as soon as possible

## 2017-07-06 NOTE — Telephone Encounter (Signed)
Okay Can talk about the pulmonologist if appropriate at the appt

## 2017-07-06 NOTE — Telephone Encounter (Signed)
Copied from Chance #[id number]. Topic: Appointment Scheduling - Scheduling Inquiry for Clinic >> Jul 06, 2017  1:29 PM [name] wrote: Reason for CRM: pt called back (see nurse triage notes under Encounters) stating she went to the nurse at [facility] as advised. They told her lungs are clear but recommended to see doctor for a chest xray.  appt scheduled for pt tomorrow 10:15am with Dr. Silvio Pate.

## 2017-07-06 NOTE — Telephone Encounter (Signed)
She called in c/o chest pain only with coughing.   She does not feel like she needs to go to the ED.    "I don't need to go to the emergency room".   "I'm not going there for this".   She saw Dr. Silvio Pate in Jan and was given antibiotics which she took.   The cough just keeps hanging on.   It's a dry cough.   No sputum at all.   "It's just a dry aggravating cough".    She mentioned she lives at [facility] and can be evaluated by an Therapist, sports at the clinic today at 1:00.   I instructed her to see the nurse at 1:00 and to call us back.   I told her Dr. Silvio Pate will probably want to see her.    She agreed to call us back after being evaluated by the RN at the clinic at 1:00.  I have routed a note to Dr. Alla German nurse pool making him aware of her situation. Reason for Disposition . Chest pain  (Exception: MILD central chest pain, present only when coughing) . Chest pain  (Exception: MILD central chest pain, present only when coughing)    She is c/o pain under her right armpit with coughing only.  She is also having pain under left breast and under left armpit with coughing only.  She lives at [facility].   She stated she did not need to go to the ED and does not want to go.   She has access to an RN at 1:00 in the clinic there that she is going to see first.   I advised her to go down when the clinic opens at 1:00 and to please call us back after she is evaluated.  She wanted to do this rather than go to the ED.  We both agreed on this plan.   I let her know that Dr. Silvio Pate will probably still want to see her.  Answer Assessment - Initial Assessment Questions 1. ONSET: "When did the cough begin?"      I've been coughing since I saw Dr. Silvio Pate in January.   2. SEVERITY: "How bad is the cough today?"      It's really aggravating.    I'm so sore under my right armpit and right chest area.   No I'm sore below my left breast and under my armpit. 3. RESPIRATORY DISTRESS:  "Describe your breathing."      I'm out of breath with any exertion.   I feel very tired. 4. FEVER: "Do you have a fever?" If so, ask: "What is your temperature, how was it measured, and when did it start?"     No fever.   I finished antibiotics that Dr. Silvio Pate gave me.  5. HEMOPTYSIS: "Are you coughing up any blood?" If so ask: "How much?" (flecks, streaks, tablespoons, etc.)     No blood.   I'm not coughing up anything. 6. TREATMENT: "What have you done so far to treat the cough?" (e.g., meds, fluids, humidifier)     I was on antibiotics.     My eyes are burning and my nose.   I have dry eye disease.     I'm not on decongestants. 7. CARDIAC HISTORY: "Do you have any history of heart disease?" (e.g., heart attack, congestive heart failure)      No   MVP that is mild.  8. LUNG HISTORY: "Do you have any history  of lung disease?"  (e.g., pulmonary embolus, asthma, emphysema)     I have asthma but it's very mild.   It only acts up with the pollen and chemical irritants.    It's been very good for a long time. 9. PE RISK FACTORS: "Do you have a history of blood clots?" (or: recent major surgery, recent prolonged travel, bedridden )     No blood clots 10. OTHER SYMPTOMS: "Do you have any other symptoms? (e.g., runny nose, wheezing, chest pain)       Just the chest soreness from the coughing. 11. PREGNANCY: "Is there any chance you are pregnant?" "When was your last menstrual period?"       Not asked 12. TRAVEL: "Have you traveled out of the country in the last month?" (e.g., travel history, exposures)       No.  Protocols used: COUGH - CHRONIC-A-AH, COUGH - ACUTE NON-PRODUCTIVE-A-AH

## 2017-07-07 ENCOUNTER — Ambulatory Visit (INDEPENDENT_AMBULATORY_CARE_PROVIDER_SITE_OTHER): Payer: Medicare Other | Admitting: Internal Medicine

## 2017-07-07 ENCOUNTER — Ambulatory Visit (INDEPENDENT_AMBULATORY_CARE_PROVIDER_SITE_OTHER)
Admission: RE | Admit: 2017-07-07 | Discharge: 2017-07-07 | Disposition: A | Discharge status: Home / Self Care | Payer: Medicare Other | Source: Ambulatory Visit | Attending: Internal Medicine | Admitting: Internal Medicine

## 2017-07-07 ENCOUNTER — Encounter: Payer: Self-pay | Admitting: Internal Medicine

## 2017-07-07 VITALS — BP 118/82 | HR 78 | Resp 12 | Wt 119.0 lb

## 2017-07-07 DIAGNOSIS — J4521 Mild intermittent asthma with (acute) exacerbation: Secondary | ICD-10-CM | POA: Diagnosis not present

## 2017-07-07 DIAGNOSIS — R079 Chest pain, unspecified: Secondary | ICD-10-CM | POA: Diagnosis not present

## 2017-07-07 DIAGNOSIS — R05 Cough: Secondary | ICD-10-CM | POA: Diagnosis not present

## 2017-07-07 MED ORDER — PREDNISONE 20 MG PO TABS
40.0000 mg | ORAL_TABLET | Freq: Every day | ORAL | 0 refills | Status: DC
Start: 2017-07-07 — End: 2017-09-08

## 2017-07-07 NOTE — Assessment & Plan Note (Addendum)
Ongoing cough probably represents flare She doesn't want to go back on chronic Rx and had been doing well till this apparent infection Will check CXR  CXR looks okay--will await the overread Will try brief course of prednisone Use albuterol more regularly till things settle down Would consider pulmonary referral &/or a different controller medication

## 2017-07-07 NOTE — Progress Notes (Signed)
Subjective:    Patient ID: Shelby Oliver, female    DOB: 11-24-1945, 72 y.o.   MRN: [id number]  HPI Here due to ongoing cough and now having secondary chest pain Cough has improved now finally Chest wall pain for about 10 days though This has happened to her again Is concerned about being SOB some Got checked by RN at [facility] yesterday Gets cough with deep breaths  Long time severe allergies Did see allergist and had desensitization Rx Then started asthma type symptoms CT scan in past showed some abnormalities Had been on and off prednisone advair for some time as well--then weaned to lower dose Has been off for about 10 years Has albuterol but rarely uses it  Current Outpatient Medications on File Prior to Visit  Medication Sig Dispense Refill  . albuterol (PROVENTIL HFA;VENTOLIN HFA) 108 (90 Base) MCG/ACT inhaler Inhale 2 puffs into the lungs every 6 (six) hours as needed for wheezing or shortness of breath. 1 Inhaler 1  . atorvastatin (LIPITOR) 10 MG tablet TAKE 1 TABLET(10 MG) BY MOUTH DAILY 90 tablet 3  . Cholecalciferol (VITAMIN D3) 1000 UNITS CAPS Take 2 capsules by mouth daily.     . metoprolol succinate (TOPROL-XL) 25 MG 24 hr tablet TAKE 1 TABLET(25 MG) BY MOUTH DAILY 90 tablet 3  . montelukast (SINGULAIR) 10 MG tablet TAKE 1 TABLET(10 MG) BY MOUTH DAILY 90 tablet 3  . Olopatadine HCl (PAZEO OP) Apply to eye.    . ranitidine (ZANTAC) 300 MG tablet Take 1 tablet (300 mg total) by mouth 2 (two) times daily. 180 tablet 3   No current facility-administered medications on file prior to visit.     Allergies  Allergen Reactions  . Penicillins   . Quinolones     Past Medical History:  Diagnosis Date  . Erosive esophagitis   . Gallbladder polyp   . Hepatic hemangioma   . Hyperlipidemia   . Mild intermittent asthma   . MVP (mitral valve prolapse)    with slight regurgitation  . Osteoporosis   . Proteinuria   . Pulmonary nodule 2000's   stable over years--no  more testing  . Sleep disturbance    chronic  . Thyroid nodule     Past Surgical History:  Procedure Laterality Date  . APPENDECTOMY    . CARPAL TUNNEL RELEASE Bilateral 1987  . SHOULDER ADHESION RELEASE Bilateral 1988, 2001  . TONSILLECTOMY      Family History  Problem Relation Age of Onset  . Cancer Mother   . Hyperlipidemia Mother   . Arthritis Mother   . Hypertension Mother   . Stroke Father        hemorrhagic  . Arthritis Father   . Heart disease Sister        mitral valve repair  . Hypertension Sister   . Diabetes Neg Hx     Social History   Socioeconomic History  . Marital status: Single    Spouse name: Not on file  . Number of children: 0  . Years of education: Not on file  . Highest education level: Not on file  Social Needs  . Financial resource strain: Not on file  . Food insecurity - worry: Not on file  . Food insecurity - inability: Not on file  . Transportation needs - medical: Not on file  . Transportation needs - non-medical: Not on file  Occupational History  . Occupation: Cytogeneticist    Comment: Retired  Tobacco  Use  . Smoking status: Never Smoker  . Smokeless tobacco: Never Used  Substance and Sexual Activity  . Alcohol use: No  . Drug use: No  . Sexual activity: Not on file  Other Topics Concern  . Not on file  Social History Narrative   Wants to be called Shelby Oliver   Has living will   Shelby Oliver and brother are her health care POA. Needs to do in [place]--requests nieces to do this [name], [place], [name])   Would accept resuscitation   No tube feeds if cognitively unaware   Review of Systems  Not sleeping as well--some daytime somnolence Appetite is improving again     Objective:   Physical Exam  Constitutional: No distress.  HENT:  Mouth/Throat: Oropharynx is clear and moist. No oropharyngeal exudate.  Neck: No thyromegaly present.  Pulmonary/Chest: Effort normal and breath sounds  normal. No respiratory distress. She has no wheezes. She has no rales.  Cough with deep breath Tenderness in lower ribs and left axilla  Lymphadenopathy:    She has no cervical adenopathy.          Assessment & Plan:

## 2017-07-31 DIAGNOSIS — H2512 Age-related nuclear cataract, left eye: Secondary | ICD-10-CM | POA: Diagnosis not present

## 2017-08-01 ENCOUNTER — Encounter: Payer: Self-pay | Admitting: *Deleted

## 2017-08-17 ENCOUNTER — Ambulatory Visit: Payer: Medicare Other | Admitting: Registered Nurse

## 2017-08-17 ENCOUNTER — Encounter: Payer: Self-pay | Admitting: *Deleted

## 2017-08-17 ENCOUNTER — Ambulatory Visit
Admission: RE | Admit: 2017-08-17 | Discharge: 2017-08-17 | Disposition: A | Discharge status: Home / Self Care | Payer: Medicare Other | Source: Ambulatory Visit | Attending: Ophthalmology | Admitting: Ophthalmology

## 2017-08-17 ENCOUNTER — Encounter
Admission: RE | Disposition: A | Discharge status: Home / Self Care | Payer: Self-pay | Source: Ambulatory Visit | Attending: Ophthalmology

## 2017-08-17 ENCOUNTER — Other Ambulatory Visit: Payer: Self-pay

## 2017-08-17 DIAGNOSIS — J45909 Unspecified asthma, uncomplicated: Secondary | ICD-10-CM | POA: Insufficient documentation

## 2017-08-17 DIAGNOSIS — Z79899 Other long term (current) drug therapy: Secondary | ICD-10-CM | POA: Insufficient documentation

## 2017-08-17 DIAGNOSIS — E7800 Pure hypercholesterolemia, unspecified: Secondary | ICD-10-CM | POA: Diagnosis not present

## 2017-08-17 DIAGNOSIS — H2512 Age-related nuclear cataract, left eye: Secondary | ICD-10-CM | POA: Insufficient documentation

## 2017-08-17 DIAGNOSIS — N189 Chronic kidney disease, unspecified: Secondary | ICD-10-CM | POA: Insufficient documentation

## 2017-08-17 DIAGNOSIS — K219 Gastro-esophageal reflux disease without esophagitis: Secondary | ICD-10-CM | POA: Insufficient documentation

## 2017-08-17 DIAGNOSIS — E785 Hyperlipidemia, unspecified: Secondary | ICD-10-CM | POA: Diagnosis not present

## 2017-08-17 DIAGNOSIS — J4520 Mild intermittent asthma, uncomplicated: Secondary | ICD-10-CM | POA: Diagnosis not present

## 2017-08-17 HISTORY — DX: Cardiac murmur, unspecified: R01.1

## 2017-08-17 HISTORY — DX: Chronic kidney disease, unspecified: N18.9

## 2017-08-17 HISTORY — DX: Edema, unspecified: R60.9

## 2017-08-17 HISTORY — DX: Personal history of other diseases of the digestive system: Z87.19

## 2017-08-17 HISTORY — DX: Other complications of anesthesia, initial encounter: T88.59XA

## 2017-08-17 HISTORY — DX: Cardiac arrhythmia, unspecified: I49.9

## 2017-08-17 HISTORY — DX: Inflammatory liver disease, unspecified: K75.9

## 2017-08-17 HISTORY — DX: Gastro-esophageal reflux disease without esophagitis: K21.9

## 2017-08-17 HISTORY — PX: CATARACT EXTRACTION W/PHACO: SHX586

## 2017-08-17 HISTORY — DX: Adverse effect of unspecified anesthetic, initial encounter: T41.45XA

## 2017-08-17 HISTORY — DX: Orthopnea: R06.01

## 2017-08-17 SURGERY — PHACOEMULSIFICATION, CATARACT, WITH IOL INSERTION
Anesthesia: Monitor Anesthesia Care | Site: Eye | Laterality: Left | Wound class: "Clean "

## 2017-08-17 MED ORDER — SODIUM HYALURONATE 10 MG/ML IO SOLN
INTRAOCULAR | Status: DC | PRN
  Administered 2017-08-17: 0.55 mL via INTRAOCULAR

## 2017-08-17 MED ORDER — LIDOCAINE HCL (PF) 4 % IJ SOLN
INTRAOCULAR | Status: DC | PRN
  Administered 2017-08-17: 4 mL via OPHTHALMIC

## 2017-08-17 MED ORDER — FENTANYL CITRATE (PF) 100 MCG/2ML IJ SOLN
INTRAMUSCULAR | Status: AC
  Filled 2017-08-17: qty 2

## 2017-08-17 MED ORDER — SODIUM HYALURONATE 23 MG/ML IO SOLN
INTRAOCULAR | Status: DC | PRN
  Administered 2017-08-17: 0.6 mL via INTRAOCULAR

## 2017-08-17 MED ORDER — POLYMYXIN B-TRIMETHOPRIM 10000-0.1 UNIT/ML-% OP SOLN: 1.0000 [drp] | OPHTHALMIC | Status: DC | PRN

## 2017-08-17 MED ORDER — POLYMYXIN B-TRIMETHOPRIM 10000-0.1 UNIT/ML-% OP SOLN
OPHTHALMIC | Status: AC
  Filled 2017-08-17: qty 10

## 2017-08-17 MED ORDER — POVIDONE-IODINE 5 % OP SOLN
OPHTHALMIC | Status: DC | PRN
  Administered 2017-08-17: 1 via OPHTHALMIC

## 2017-08-17 MED ORDER — MIDAZOLAM HCL 2 MG/2ML IJ SOLN
INTRAMUSCULAR | Status: AC
  Filled 2017-08-17: qty 2

## 2017-08-17 MED ORDER — EPINEPHRINE PF 1 MG/ML IJ SOLN
INTRAMUSCULAR | Status: AC
  Filled 2017-08-17: qty 2

## 2017-08-17 MED ORDER — FENTANYL CITRATE (PF) 100 MCG/2ML IJ SOLN
INTRAMUSCULAR | Status: DC | PRN
  Administered 2017-08-17 (×2): 25 ug via INTRAVENOUS

## 2017-08-17 MED ORDER — LIDOCAINE HCL (PF) 4 % IJ SOLN
INTRAMUSCULAR | Status: AC
  Filled 2017-08-17: qty 5

## 2017-08-17 MED ORDER — MIDAZOLAM HCL 2 MG/2ML IJ SOLN
INTRAMUSCULAR | Status: DC | PRN
  Administered 2017-08-17 (×2): 1 mg via INTRAVENOUS

## 2017-08-17 MED ORDER — ARMC OPHTHALMIC DILATING DROPS
1.0000 "application " | OPHTHALMIC | Status: AC
  Administered 2017-08-17 (×3): 1 via OPHTHALMIC

## 2017-08-17 MED ORDER — BSS IO SOLN
INTRAOCULAR | Status: DC | PRN
  Administered 2017-08-17: 11:00:00 via OPHTHALMIC

## 2017-08-17 MED ORDER — ARMC OPHTHALMIC DILATING DROPS
OPHTHALMIC | Status: AC
  Filled 2017-08-17: qty 0.4

## 2017-08-17 MED ORDER — POVIDONE-IODINE 5 % OP SOLN
OPHTHALMIC | Status: AC
  Filled 2017-08-17: qty 30

## 2017-08-17 MED ORDER — SODIUM HYALURONATE 23 MG/ML IO SOLN
INTRAOCULAR | Status: AC
  Filled 2017-08-17: qty 0.6

## 2017-08-17 MED ORDER — SODIUM CHLORIDE 0.9 % IV SOLN
INTRAVENOUS | Status: DC
  Administered 2017-08-17: 09:00:00 via INTRAVENOUS

## 2017-08-17 MED ORDER — POLYMYXIN B-TRIMETHOPRIM 10000-0.1 UNIT/ML-% OP SOLN
OPHTHALMIC | Status: DC | PRN
  Administered 2017-08-17: 1 [drp]

## 2017-08-17 SURGICAL SUPPLY — 16 items

## 2017-08-17 NOTE — Anesthesia Post-op Follow-up Note (Signed)
Anesthesia QCDR form completed.        

## 2017-08-17 NOTE — Anesthesia Preprocedure Evaluation (Signed)
Anesthesia Evaluation  Patient identified by MRN, date of birth, ID band Patient awake    Reviewed: Allergy & Precautions, H&P , NPO status , Patient's Chart, lab work & pertinent test results  History of Anesthesia Complications (+) history of anesthetic complications  Airway Mallampati: III  TM Distance: <3 FB Neck ROM: full    Dental  (+) Chipped   Pulmonary asthma ,           Cardiovascular Exercise Tolerance: Good (-) angina+ Orthopnea  (-) Past MI negative cardio ROS  + dysrhythmias + Valvular Problems/Murmurs      Neuro/Psych negative neurological ROS  negative psych ROS   GI/Hepatic Neg liver ROS, hiatal hernia, PUD, GERD  Medicated and Controlled,(+) Hepatitis -  Endo/Other  negative endocrine ROS  Renal/GU Renal disease     Musculoskeletal   Abdominal   Peds  Hematology negative hematology ROS (+)   Anesthesia Other Findings Past Medical History: No date: Chronic kidney disease     Comment:  RENAL INSUFF No date: Complication of anesthesia     Comment:  RESPIRATORY PROBLEMS WITH SHOULDER SURGERY No date: Dysrhythmia No date: Edema     Comment:  FEET/ANKLES No date: Erosive esophagitis No date: Gallbladder polyp No date: GERD (gastroesophageal reflux disease) No date: Heart murmur No date: Hepatic hemangioma No date: Hepatitis     Comment:  A IN PAST No date: History of hiatal hernia No date: Hyperlipidemia No date: Mild intermittent asthma No date: MVP (mitral valve prolapse)     Comment:  with slight regurgitation No date: Orthopnea No date: Osteoporosis No date: Proteinuria 2000's: Pulmonary nodule     Comment:  stable over years--no more testing No date: Sleep disturbance     Comment:  chronic No date: Thyroid nodule  Past Surgical History: No date: APPENDECTOMY 1987: CARPAL TUNNEL RELEASE; Bilateral 1988, 2001: SHOULDER ADHESION RELEASE; Bilateral No date: TONSILLECTOMY  BMI     Body Mass Index:  27.43 kg/m      Reproductive/Obstetrics negative OB ROS                             Anesthesia Physical Anesthesia Plan  ASA: III  Anesthesia Plan: MAC   Post-op Pain Management:    Induction: Intravenous  PONV Risk Score and Plan:   Airway Management Planned: Natural Airway and Nasal Cannula  Additional Equipment:   Intra-op Plan:   Post-operative Plan:   Informed Consent: I have reviewed the patients History and Physical, chart, labs and discussed the procedure including the risks, benefits and alternatives for the proposed anesthesia with the patient or authorized representative who has indicated his/her understanding and acceptance.   Dental Advisory Given  Plan Discussed with: Anesthesiologist, CRNA and Surgeon  Anesthesia Plan Comments: (Patient consented for risks of anesthesia including but not limited to:  - adverse reactions to medications - damage to teeth, lips or other oral mucosa - sore throat or hoarseness - Damage to heart, brain, lungs or loss of life  Patient voiced understanding.)        Anesthesia Quick Evaluation

## 2017-08-17 NOTE — Op Note (Signed)
OPERATIVE NOTE  Margarit Minshall [id number] 08/17/2017   PREOPERATIVE DIAGNOSIS:  Nuclear sclerotic cataract left eye.  H25.12   POSTOPERATIVE DIAGNOSIS:    Nuclear sclerotic cataract left eye.     PROCEDURE:  Phacoemusification with posterior chamber intraocular lens placement of the left eye   LENS:   Implant Name Type Inv. Item Serial No. Manufacturer Lot No. LRB No. Used  LENS IOL DIOP 20.5 - [id number] Intraocular Lens LENS IOL DIOP 20.5 [id number]  Left 1       PCB00 +20.5   ULTRASOUND TIME: 0 minutes 29 seconds.  CDE 3.21   SURGEON:  Benay Pillow, MD, MPH   ANESTHESIA:  Topical with tetracaine drops augmented with 1% preservative-free intracameral lidocaine.  ESTIMATED BLOOD LOSS: <1 mL   COMPLICATIONS:  None.   DESCRIPTION OF PROCEDURE:  The patient was identified in the holding room and transported to the operating room and placed in the supine position under the operating microscope.  The left eye was identified as the operative eye and it was prepped and draped in the usual sterile ophthalmic fashion.   A 1.0 millimeter clear-corneal paracentesis was made at the 5:00 position. 0.5 ml of preservative-free 1% lidocaine with epinephrine was injected into the anterior chamber.  The anterior chamber was filled with Healon 5 viscoelastic.  A 2.4 millimeter keratome was used to make a near-clear corneal incision at the 2:00 position.  A curvilinear capsulorrhexis was made with a cystotome and capsulorrhexis forceps.  Balanced salt solution was used to hydrodissect and hydrodelineate the nucleus.   Phacoemulsification was then used in stop and chop fashion to remove the lens nucleus and epinucleus.  The remaining cortex was then removed using the irrigation and aspiration handpiece. Healon was then placed into the capsular bag to distend it for lens placement.  A lens was then injected into the capsular bag.  The remaining viscoelastic was aspirated.   Wounds were  hydrated with balanced salt solution.  The anterior chamber was inflated to a physiologic pressure with balanced salt solution.  No intracameral antibiotcs were used given the patients allergies.  No wound leaks were noted.  Polytrim eye drops were placed on the ocular surface.  The patient was taken to the recovery room in stable condition without complications of anesthesia or surgery  Benay Pillow 08/17/2017, 11:00 AM

## 2017-08-17 NOTE — Transfer of Care (Signed)
Immediate Anesthesia Transfer of Care Note  Patient: Shelby Oliver  Procedure(s) Performed: CATARACT EXTRACTION PHACO AND INTRAOCULAR LENS PLACEMENT ([place]) (Left Eye)  Patient Location: PACU and Short Stay  Anesthesia Type:MAC  Level of Consciousness: awake, alert  and oriented  Airway & Oxygen Therapy: Patient Spontanous Breathing  Post-op Assessment: Report given to RN and Post -op Vital signs reviewed and stable  Post vital signs: Reviewed and stable  Last Vitals:  Vitals Value Taken Time  BP 114/56 08/17/2017 11:02 AM  Temp 36.7 C 08/17/2017 11:02 AM  Pulse 64 08/17/2017 11:02 AM  Resp 12 08/17/2017 11:02 AM  SpO2 96 % 08/17/2017 11:02 AM    Last Pain:  Vitals:   08/17/17 1102  TempSrc: Tympanic         Complications: No apparent anesthesia complications

## 2017-08-17 NOTE — Discharge Instructions (Signed)
Eye Surgery Discharge Instructions  Expect mild scratchy sensation or mild soreness. DO NOT RUB YOUR EYE!  The day of surgery:  Minimal physical activity, but bed rest is not required  No reading, computer work, or close hand work  No bending, lifting, or straining.  May watch TV  For 24 hours:  No driving, legal decisions, or alcoholic beverages  Safety precautions  Eat anything you prefer: It is better to start with liquids, then soup then solid foods.  _____ Eye patch should be worn until postoperative exam tomorrow.  ____ Solar shield eyeglasses should be worn for comfort in the sunlight/patch while sleeping  Resume all regular medications including aspirin or Coumadin if these were discontinued prior to surgery. You may shower, bathe, shave, or wash your hair. Tylenol may be taken for mild discomfort.  Call your doctor if you experience significant pain, nausea, or vomiting, fever > 101 or other signs of infection. [phone] or [phone] Specific instructions:  Follow-up Information    Eulogio Bear, MD Follow up.   Specialty:  Ophthalmology Why:  April 12 at 9:35am Contact information: [place] [id number] [phone]

## 2017-08-17 NOTE — H&P (Signed)
The History and Physical notes are on paper, have been signed, and are to be scanned.   I have examined the patient and there are no changes to the H&P.   Shelby Oliver 08/17/2017 10:27 AM

## 2017-08-17 NOTE — Anesthesia Postprocedure Evaluation (Signed)
Anesthesia Post Note  Patient: Occupational psychologist  Procedure(s) Performed: CATARACT EXTRACTION PHACO AND INTRAOCULAR LENS PLACEMENT ([place]) (Left Eye)  Patient location during evaluation: Short Stay Anesthesia Type: MAC Level of consciousness: awake, awake and alert and oriented Pain management: satisfactory to patient Vital Signs Assessment: post-procedure vital signs reviewed and stable Respiratory status: spontaneous breathing Cardiovascular status: blood pressure returned to baseline and stable Postop Assessment: no apparent nausea or vomiting and adequate PO intake Anesthetic complications: no     Last Vitals:  Vitals:   08/17/17 0833 08/17/17 1102  BP: (!) 137/91 (!) 114/56  Pulse: 75 64  Resp:  12  Temp: (!) 36.1 C 36.7 C  SpO2: 91% 96%    Last Pain:  Vitals:   08/17/17 1102  TempSrc: Tympanic                 Latavion Halls C Glynn Freas

## 2017-08-28 DIAGNOSIS — H43812 Vitreous degeneration, left eye: Secondary | ICD-10-CM | POA: Diagnosis not present

## 2017-09-04 DIAGNOSIS — H2511 Age-related nuclear cataract, right eye: Secondary | ICD-10-CM | POA: Diagnosis not present

## 2017-09-06 ENCOUNTER — Encounter: Payer: Self-pay | Admitting: *Deleted

## 2017-09-14 ENCOUNTER — Encounter: Payer: Self-pay | Admitting: *Deleted

## 2017-09-14 ENCOUNTER — Ambulatory Visit
Admission: RE | Admit: 2017-09-14 | Discharge: 2017-09-14 | Disposition: A | Discharge status: Home / Self Care | Payer: Medicare Other | Source: Ambulatory Visit | Attending: Ophthalmology | Admitting: Ophthalmology

## 2017-09-14 ENCOUNTER — Encounter
Admission: RE | Disposition: A | Discharge status: Home / Self Care | Payer: Self-pay | Source: Ambulatory Visit | Attending: Ophthalmology

## 2017-09-14 ENCOUNTER — Other Ambulatory Visit: Payer: Self-pay

## 2017-09-14 ENCOUNTER — Ambulatory Visit: Payer: Medicare Other | Admitting: Anesthesiology

## 2017-09-14 DIAGNOSIS — I10 Essential (primary) hypertension: Secondary | ICD-10-CM | POA: Diagnosis not present

## 2017-09-14 DIAGNOSIS — R011 Cardiac murmur, unspecified: Secondary | ICD-10-CM | POA: Diagnosis not present

## 2017-09-14 DIAGNOSIS — E7800 Pure hypercholesterolemia, unspecified: Secondary | ICD-10-CM | POA: Diagnosis not present

## 2017-09-14 DIAGNOSIS — I341 Nonrheumatic mitral (valve) prolapse: Secondary | ICD-10-CM | POA: Insufficient documentation

## 2017-09-14 DIAGNOSIS — Z79899 Other long term (current) drug therapy: Secondary | ICD-10-CM | POA: Diagnosis not present

## 2017-09-14 DIAGNOSIS — J45909 Unspecified asthma, uncomplicated: Secondary | ICD-10-CM | POA: Insufficient documentation

## 2017-09-14 DIAGNOSIS — H2511 Age-related nuclear cataract, right eye: Secondary | ICD-10-CM | POA: Insufficient documentation

## 2017-09-14 DIAGNOSIS — Z8711 Personal history of peptic ulcer disease: Secondary | ICD-10-CM | POA: Insufficient documentation

## 2017-09-14 DIAGNOSIS — K219 Gastro-esophageal reflux disease without esophagitis: Secondary | ICD-10-CM | POA: Insufficient documentation

## 2017-09-14 DIAGNOSIS — Z888 Allergy status to other drugs, medicaments and biological substances status: Secondary | ICD-10-CM | POA: Diagnosis not present

## 2017-09-14 DIAGNOSIS — Z880 Allergy status to penicillin: Secondary | ICD-10-CM | POA: Insufficient documentation

## 2017-09-14 DIAGNOSIS — E119 Type 2 diabetes mellitus without complications: Secondary | ICD-10-CM | POA: Diagnosis not present

## 2017-09-14 DIAGNOSIS — Z881 Allergy status to other antibiotic agents status: Secondary | ICD-10-CM | POA: Insufficient documentation

## 2017-09-14 DIAGNOSIS — Z8619 Personal history of other infectious and parasitic diseases: Secondary | ICD-10-CM | POA: Insufficient documentation

## 2017-09-14 HISTORY — PX: CATARACT EXTRACTION W/PHACO: SHX586

## 2017-09-14 SURGERY — PHACOEMULSIFICATION, CATARACT, WITH IOL INSERTION
Anesthesia: Monitor Anesthesia Care | Site: Eye | Laterality: Right | Wound class: "Clean "

## 2017-09-14 MED ORDER — LIDOCAINE HCL (PF) 4 % IJ SOLN
INTRAOCULAR | Status: DC | PRN
  Administered 2017-09-14: 4 mL via OPHTHALMIC

## 2017-09-14 MED ORDER — EPINEPHRINE PF 1 MG/ML IJ SOLN
INTRAMUSCULAR | Status: AC
  Filled 2017-09-14: qty 2

## 2017-09-14 MED ORDER — MOXIFLOXACIN HCL 0.5 % OP SOLN
OPHTHALMIC | Status: AC
  Filled 2017-09-14: qty 3

## 2017-09-14 MED ORDER — ARMC OPHTHALMIC DILATING DROPS
1.0000 "application " | OPHTHALMIC | Status: AC
  Administered 2017-09-14 (×3): 1 via OPHTHALMIC

## 2017-09-14 MED ORDER — MIDAZOLAM HCL 2 MG/2ML IJ SOLN
INTRAMUSCULAR | Status: AC
  Filled 2017-09-14: qty 2

## 2017-09-14 MED ORDER — POLYMYXIN B-TRIMETHOPRIM 10000-0.1 UNIT/ML-% OP SOLN: 1.0000 [drp] | OPHTHALMIC | Status: DC | PRN

## 2017-09-14 MED ORDER — POVIDONE-IODINE 5 % OP SOLN
OPHTHALMIC | Status: AC
  Filled 2017-09-14: qty 30

## 2017-09-14 MED ORDER — SODIUM HYALURONATE 23 MG/ML IO SOLN
INTRAOCULAR | Status: AC
  Filled 2017-09-14: qty 0.6

## 2017-09-14 MED ORDER — SODIUM CHLORIDE 0.9 % IV SOLN
INTRAVENOUS | Status: DC
  Administered 2017-09-14 (×2): via INTRAVENOUS

## 2017-09-14 MED ORDER — EPINEPHRINE PF 1 MG/ML IJ SOLN
INTRAOCULAR | Status: DC | PRN
  Administered 2017-09-14: 10:00:00 via OPHTHALMIC

## 2017-09-14 MED ORDER — SODIUM HYALURONATE 23 MG/ML IO SOLN
INTRAOCULAR | Status: DC | PRN
  Administered 2017-09-14: 0.6 mL via INTRAOCULAR

## 2017-09-14 MED ORDER — POLYMYXIN B-TRIMETHOPRIM 10000-0.1 UNIT/ML-% OP SOLN
OPHTHALMIC | Status: AC
  Filled 2017-09-14: qty 10

## 2017-09-14 MED ORDER — SODIUM HYALURONATE 10 MG/ML IO SOLN
INTRAOCULAR | Status: DC | PRN
  Administered 2017-09-14: 0.55 mL via INTRAOCULAR

## 2017-09-14 MED ORDER — POVIDONE-IODINE 5 % OP SOLN
OPHTHALMIC | Status: DC | PRN
  Administered 2017-09-14: 1 via OPHTHALMIC

## 2017-09-14 MED ORDER — MIDAZOLAM HCL 2 MG/2ML IJ SOLN
INTRAMUSCULAR | Status: DC | PRN
  Administered 2017-09-14: 0.5 mg via INTRAVENOUS
  Administered 2017-09-14: 1 mg via INTRAVENOUS
  Administered 2017-09-14: 0.5 mg via INTRAVENOUS

## 2017-09-14 MED ORDER — ALFENTANIL 500 MCG/ML IJ INJ
INJECTION | INTRAVENOUS | Status: DC | PRN
  Administered 2017-09-14 (×2): 250 ug via INTRAVENOUS

## 2017-09-14 MED ORDER — LIDOCAINE HCL (PF) 4 % IJ SOLN
INTRAMUSCULAR | Status: AC
  Filled 2017-09-14: qty 5

## 2017-09-14 MED ORDER — ARMC OPHTHALMIC DILATING DROPS
OPHTHALMIC | Status: AC
  Administered 2017-09-14: 1 via OPHTHALMIC
  Filled 2017-09-14: qty 0.4

## 2017-09-14 MED ORDER — FENTANYL CITRATE (PF) 100 MCG/2ML IJ SOLN: 25.0000 ug | INTRAMUSCULAR | Status: DC | PRN

## 2017-09-14 MED ORDER — ONDANSETRON HCL 4 MG/2ML IJ SOLN: 4.0000 mg | Freq: Once | INTRAMUSCULAR | Status: DC | PRN

## 2017-09-14 MED ORDER — POLYMYXIN B-TRIMETHOPRIM 10000-0.1 UNIT/ML-% OP SOLN
OPHTHALMIC | Status: DC | PRN
  Administered 2017-09-14: 1 [drp]

## 2017-09-14 SURGICAL SUPPLY — 16 items

## 2017-09-14 NOTE — Discharge Instructions (Signed)
Eye Surgery Discharge Instructions  Expect mild scratchy sensation or mild soreness. DO NOT RUB YOUR EYE!  The day of surgery:  Minimal physical activity, but bed rest is not required  No reading, computer work, or close hand work  No bending, lifting, or straining.  May watch TV  For 24 hours:  No driving, legal decisions, or alcoholic beverages  Safety precautions  Eat anything you prefer: It is better to start with liquids, then soup then solid foods.  _____ Eye patch should be worn until postoperative exam tomorrow.  ____ Solar shield eyeglasses should be worn for comfort in the sunlight/patch while sleeping  Resume all regular medications including aspirin or Coumadin if these were discontinued prior to surgery. You may shower, bathe, shave, or wash your hair. Tylenol may be taken for mild discomfort.  Call your doctor if you experience significant pain, nausea, or vomiting, fever > 101 or other signs of infection. [phone] or [phone] Specific instructions:  Follow-up Information    Eulogio Bear, MD Follow up on 09/15/2017.   Specialty:  Ophthalmology Why:  9:05 Contact information: [place] [address] [phone]

## 2017-09-14 NOTE — Transfer of Care (Signed)
Immediate Anesthesia Transfer of Care Note  Patient: Shelby Oliver  Procedure(s) Performed: CATARACT EXTRACTION PHACO AND INTRAOCULAR LENS PLACEMENT ([facility]) (Right Eye)  Patient Location: PACU and Short Stay  Anesthesia Type:MAC  Level of Consciousness: awake and patient cooperative  Airway & Oxygen Therapy: Patient Spontanous Breathing  Post-op Assessment: Report given to RN and Post -op Vital signs reviewed and stable  Post vital signs: Reviewed and stable  Last Vitals:  Vitals Value Taken Time  BP    Temp    Pulse    Resp    SpO2      Last Pain:  Vitals:   09/14/17 0849  TempSrc: Temporal  PainSc: 0-No pain         Complications: No apparent anesthesia complications

## 2017-09-14 NOTE — Anesthesia Preprocedure Evaluation (Signed)
Anesthesia Evaluation  Patient identified by MRN, date of birth, ID band Patient awake    Reviewed: Allergy & Precautions, H&P , NPO status , Patient's Chart, lab work & pertinent test results, reviewed documented beta blocker date and time   History of Anesthesia Complications (+) history of anesthetic complications  Airway Mallampati: II  TM Distance: >3 FB Neck ROM: full    Dental no notable dental hx. (+) Teeth Intact   Pulmonary neg pulmonary ROS, neg shortness of breath, asthma ,    Pulmonary exam normal breath sounds clear to auscultation       Cardiovascular Exercise Tolerance: Poor hypertension, On Medications + Orthopnea  negative cardio ROS  + dysrhythmias + Valvular Problems/Murmurs  Rhythm:regular Rate:Normal     Neuro/Psych negative neurological ROS  negative psych ROS   GI/Hepatic negative GI ROS, Neg liver ROS, hiatal hernia, PUD, GERD  ,(+) Hepatitis -  Endo/Other  negative endocrine ROSdiabetes  Renal/GU Renal disease     Musculoskeletal   Abdominal   Peds  Hematology negative hematology ROS (+)   Anesthesia Other Findings   Reproductive/Obstetrics negative OB ROS                             Anesthesia Physical Anesthesia Plan  ASA: III  Anesthesia Plan: MAC   Post-op Pain Management:    Induction:   PONV Risk Score and Plan:   Airway Management Planned:   Additional Equipment:   Intra-op Plan:   Post-operative Plan:   Informed Consent: I have reviewed the patients History and Physical, chart, labs and discussed the procedure including the risks, benefits and alternatives for the proposed anesthesia with the patient or authorized representative who has indicated his/her understanding and acceptance.     Plan Discussed with: CRNA  Anesthesia Plan Comments:         Anesthesia Quick Evaluation

## 2017-09-14 NOTE — Anesthesia Post-op Follow-up Note (Signed)
Anesthesia QCDR form completed.        

## 2017-09-14 NOTE — H&P (Signed)
The History and Physical notes are on paper, have been signed, and are to be scanned.   I have examined the patient and there are no changes to the H&P.   Shelby Oliver 09/14/2017 9:39 AM

## 2017-09-14 NOTE — Op Note (Signed)
OPERATIVE NOTE  Shelby Oliver [id number] 09/14/2017   PREOPERATIVE DIAGNOSIS:  Nuclear sclerotic cataract right eye.  H25.11   POSTOPERATIVE DIAGNOSIS:    Nuclear sclerotic cataract right eye.     PROCEDURE:  Phacoemusification with posterior chamber intraocular lens placement of the right eye   LENS:   Implant Name Type Inv. Item Serial No. Manufacturer Lot No. LRB No. Used  LENS IOL DIOP 20.0 - [id number] Intraocular Lens LENS IOL DIOP 20.0 [id number]  Right 1       PCB00 +20.0   ULTRASOUND TIME: 0 minutes 21 seconds.  CDE 1.00   SURGEON:  Benay Pillow, MD, MPH  ANESTHESIOLOGIST: Anesthesiologist: Molli Barrows, MD CRNA: Courtney Paris, CRNA   ANESTHESIA:  Topical with tetracaine drops augmented with 1% preservative-free intracameral lidocaine.  ESTIMATED BLOOD LOSS: less than 1 mL.   COMPLICATIONS:  None.   DESCRIPTION OF PROCEDURE:  The patient was identified in the holding room and transported to the operating room and placed in the supine position under the operating microscope.  The right eye was identified as the operative eye and it was prepped and draped in the usual sterile ophthalmic fashion.   A 1.0 millimeter clear-corneal paracentesis was made at the 10:30 position. 0.5 ml of preservative-free 1% lidocaine with epinephrine was injected into the anterior chamber.  The anterior chamber was filled with Healon 5 viscoelastic.  A 2.4 millimeter keratome was used to make a near-clear corneal incision at the 8:00 position.  A curvilinear capsulorrhexis was made with a cystotome and capsulorrhexis forceps.  Balanced salt solution was used to hydrodissect and hydrodelineate the nucleus.   Phacoemulsification was then used in stop and chop fashion to remove the lens nucleus and epinucleus.  The remaining cortex was then removed using the irrigation and aspiration handpiece. Healon was then placed into the capsular bag to distend it for lens placement.  A lens was  then injected into the capsular bag.  The remaining viscoelastic was aspirated.   Wounds were hydrated with balanced salt solution.  The anterior chamber was inflated to a physiologic pressure with balanced salt solution.   Intracameral vigamox 0.1 mL undiluted was injected into the eye and a drop placed onto the ocular surface.  No wound leaks were noted.  The patient was taken to the recovery room in stable condition without complications of anesthesia or surgery  Benay Pillow 09/14/2017, 10:20 AM

## 2017-09-14 NOTE — Anesthesia Postprocedure Evaluation (Signed)
Anesthesia Post Note  Patient: Occupational psychologist  Procedure(s) Performed: CATARACT EXTRACTION PHACO AND INTRAOCULAR LENS PLACEMENT ([facility]) (Right Eye)  Anesthesia Type: MAC Level of consciousness: awake Pain management: pain level controlled Vital Signs Assessment: post-procedure vital signs reviewed and stable Respiratory status: spontaneous breathing Cardiovascular status: stable Anesthetic complications: no     Last Vitals:  Vitals:   09/14/17 0849  BP: (!) 128/93  Pulse: 74  Resp: 18  Temp: (!) 36.2 C    Last Pain:  Vitals:   09/14/17 0849  TempSrc: Temporal  PainSc: 0-No pain                 Bonnita Hollow

## 2017-09-15 ENCOUNTER — Encounter: Payer: Self-pay | Admitting: Ophthalmology

## 2018-01-03 DIAGNOSIS — H1013 Acute atopic conjunctivitis, bilateral: Secondary | ICD-10-CM | POA: Diagnosis not present

## 2018-02-22 DIAGNOSIS — Z23 Encounter for immunization: Secondary | ICD-10-CM | POA: Diagnosis not present

## 2018-03-07 ENCOUNTER — Ambulatory Visit (INDEPENDENT_AMBULATORY_CARE_PROVIDER_SITE_OTHER): Payer: Medicare Other | Admitting: Internal Medicine

## 2018-03-07 ENCOUNTER — Encounter: Payer: Self-pay | Admitting: Internal Medicine

## 2018-03-07 VITALS — BP 122/84 | HR 74 | Temp 98.1°F | Ht <= 58 in | Wt 121.0 lb

## 2018-03-07 DIAGNOSIS — E785 Hyperlipidemia, unspecified: Secondary | ICD-10-CM

## 2018-03-07 DIAGNOSIS — Z0000 Encounter for general adult medical examination without abnormal findings: Secondary | ICD-10-CM

## 2018-03-07 DIAGNOSIS — Z1211 Encounter for screening for malignant neoplasm of colon: Secondary | ICD-10-CM

## 2018-03-07 DIAGNOSIS — R809 Proteinuria, unspecified: Secondary | ICD-10-CM | POA: Diagnosis not present

## 2018-03-07 DIAGNOSIS — K2210 Ulcer of esophagus without bleeding: Secondary | ICD-10-CM

## 2018-03-07 DIAGNOSIS — I341 Nonrheumatic mitral (valve) prolapse: Secondary | ICD-10-CM

## 2018-03-07 DIAGNOSIS — J4520 Mild intermittent asthma, uncomplicated: Secondary | ICD-10-CM | POA: Diagnosis not present

## 2018-03-07 DIAGNOSIS — Z7189 Other specified counseling: Secondary | ICD-10-CM

## 2018-03-07 LAB — LIPID PANEL
Cholesterol: 206 mg/dL — ABNORMAL HIGH (ref 0–200)
HDL: 44.9 mg/dL (ref >39.00)
Total CHOL/HDL Ratio: 5

## 2018-03-07 LAB — CBC
HEMATOCRIT: 38.1 % (ref 36.0–46.0)
HEMOGLOBIN: 13.2 g/dL (ref 12.0–15.0)
MCHC: 34.8 g/dL (ref 30.0–36.0)
MCV: 88.2 fl (ref 78.0–100.0)
Platelets: 268 10*3/uL (ref 150.0–400.0)
RBC: 4.31 Mil/uL (ref 3.87–5.11)
RDW: 12.2 % (ref 11.5–15.5)
WBC: 7.2 10*3/uL (ref 4.0–10.5)

## 2018-03-07 LAB — COMPREHENSIVE METABOLIC PANEL
ALK PHOS: 50 U/L (ref 39–117)
ALT: 17 U/L (ref 0–35)
AST: 20 U/L (ref 0–37)
Albumin: 4.3 g/dL (ref 3.5–5.2)
BILIRUBIN TOTAL: 0.4 mg/dL (ref 0.2–1.2)
BUN: 21 mg/dL (ref 6–23)
CALCIUM: 9.6 mg/dL (ref 8.4–10.5)
CO2: 29 meq/L (ref 19–32)
CREATININE: 0.77 mg/dL (ref 0.40–1.20)
Chloride: 100 mEq/L (ref 96–112)
GFR: 78.14 mL/min (ref >60.00)
Glucose, Bld: 101 mg/dL — ABNORMAL HIGH (ref 70–99)
Potassium: 4.1 mEq/L (ref 3.5–5.1)
Sodium: 136 mEq/L (ref 135–145)
Total Protein: 7.5 g/dL (ref 6.0–8.3)

## 2018-03-07 LAB — MICROALBUMIN / CREATININE URINE RATIO
Creatinine,U: 39.3 mg/dL
Microalb Creat Ratio: 1.8 mg/g (ref 0.0–30.0)

## 2018-03-07 LAB — T4, FREE: Free T4: 0.73 ng/dL (ref 0.60–1.60)

## 2018-03-07 LAB — LDL CHOLESTEROL, DIRECT: LDL DIRECT: 88 mg/dL

## 2018-03-07 MED ORDER — MONTELUKAST SODIUM 10 MG PO TABS: ORAL_TABLET | ORAL | 3 refills | Status: DC

## 2018-03-07 MED ORDER — ATORVASTATIN CALCIUM 10 MG PO TABS: ORAL_TABLET | ORAL | 3 refills | Status: DC

## 2018-03-07 MED ORDER — METOPROLOL SUCCINATE ER 25 MG PO TB24: ORAL_TABLET | ORAL | 3 refills | Status: DC

## 2018-03-07 NOTE — Assessment & Plan Note (Signed)
See social history Did formal HC planning in [place] now---copy given

## 2018-03-07 NOTE — Progress Notes (Signed)
Hearing Screening   Method: Audiometry   125Hz 250Hz 500Hz 1000Hz 2000Hz 3000Hz 4000Hz 6000Hz 8000Hz  Right ear:   20 20 20  20    Left ear:   20 20 20  20    Vision Screening Comments: September 2019   

## 2018-03-07 NOTE — Assessment & Plan Note (Signed)
Does okay with the montelukast

## 2018-03-07 NOTE — Assessment & Plan Note (Signed)
Does okay with the beta blocker

## 2018-03-07 NOTE — Assessment & Plan Note (Signed)
Chronic in past Will recheck now

## 2018-03-07 NOTE — Assessment & Plan Note (Signed)
Symptoms worse Will set up with GI

## 2018-03-07 NOTE — Assessment & Plan Note (Signed)
I have personally reviewed the Medicare Annual Wellness questionnaire and have noted 1. The patient's medical and social history 2. Their use of alcohol, tobacco or illicit drugs 3. Their current medications and supplements 4. The patient's functional ability including ADL's, fall risks, home safety risks and hearing or visual             impairment. 5. Diet and physical activities 6. Evidence for depression or mood disorders  The patients weight, height, BMI and visual acuity have been recorded in the chart I have made referrals, counseling and provided education to the patient based review of the above and I have provided the pt with a written personalized care plan for preventive services.  I have provided you with a copy of your personalized plan for preventive services. Please take the time to review along with your updated medication list.  Had flu vaccine Will look into shingrix at pharmacy Prefers no mammograms Will do FIT

## 2018-03-07 NOTE — Assessment & Plan Note (Signed)
No problems with primary prevention 

## 2018-03-07 NOTE — Patient Instructions (Signed)
You need to find static (isometric) core and back strengthening exercises

## 2018-03-07 NOTE — Progress Notes (Signed)
Subjective:    Patient ID: Shelby Oliver, female    DOB: 02-05-1946, 72 y.o.   MRN: [id number]  HPI Here for Medicare wellness visit and follow up of chronic health conditions Reviewed form and advanced directives Reviewed other doctors No alcohol or tobacco Vision okay ---had cataracts done. Still some issues with intermittent blurriness Hearing is fine No falls No depression or anhedonia Regular exercise Independent with instrumental ADLs No memory problems  Still with chronic GI issues Has to limit her eating Still gets food stuck and has to wash things down Known erosive esophagitis---past injections, dilation Corkscrew esophagus Weight had been down to 70# at the worst--and now gaining steadily Some contractions at night Never happy with PPIs, had been on ranitidine but stopped that after the warnings (and has retried omeprazole recently)  Trying to exercise regularly--using bands.  Now with left shoulder soreness and pain---but now into the elbow and down into fingers Takes tylenol at night due to pain She tries ice (didn't help) and ROM  Some pelvic pain with some of the movements in her exercise classes Has had past back injections---even by Dr Sharlet Salina since here  Some rash and itching in ring around her upper chest (?along bra) Has tried cortisone and other creams--not helping Hasn't seen Dr Evorn Gong in while Worse in heat  Ongoing allergy symptoms Ran out of montelukast--- and can feel the difference Plans to start zyrtec also Uses albuterol only occasionally--not all that helpful  On metoprolol for MVP Mostly controls symptoms Still gets some "flutter" No chest pain or SOB  Current Outpatient Medications on File Prior to Visit  Medication Sig Dispense Refill  . acetaminophen (TYLENOL) 500 MG tablet Take 1,000 mg by mouth every 8 (eight) hours as needed.    Marland Kitchen albuterol (PROVENTIL HFA;VENTOLIN HFA) 108 (90 Base) MCG/ACT inhaler Inhale 2 puffs into the  lungs every 6 (six) hours as needed for wheezing or shortness of breath. 1 Inhaler 1  . atorvastatin (LIPITOR) 10 MG tablet TAKE 1 TABLET(10 MG) BY MOUTH DAILY 90 tablet 3  . cholecalciferol (VITAMIN D) 1000 units tablet Take 5,000 Units by mouth once a week. Sundays    . metoprolol succinate (TOPROL-XL) 25 MG 24 hr tablet TAKE 1 TABLET(25 MG) BY MOUTH DAILY 90 tablet 3  . montelukast (SINGULAIR) 10 MG tablet TAKE 1 TABLET(10 MG) BY MOUTH DAILY 90 tablet 3  . DUREZOL 0.05 % EMUL Place 1 drop into the left eye 2 (two) times daily.  0  . fluorometholone (FML) 0.1 % ophthalmic suspension Place 1 drop into both eyes daily as needed.      No current facility-administered medications on file prior to visit.     Allergies  Allergen Reactions  . Avelox [Moxifloxacin Hcl In Nacl] Hives  . Ciprocin-Fluocin-Procin [Fluocinolone] Hives  . Penicillins Hives    Has patient had a PCN reaction causing immediate rash, facial/tongue/throat swelling, SOB or lightheadedness with hypotension: no Has patient had a PCN reaction causing severe rash involving mucus membranes or skin necrosis: no Has patient had a PCN reaction that required hospitalization: yes Has patient had a PCN reaction occurring within the last 10 years: no If all of the above answers are "NO", then may proceed with Cephalosporin use.   . Quinolones Hives    Past Medical History:  Diagnosis Date  . Chronic kidney disease    RENAL INSUFF  . Complication of anesthesia    RESPIRATORY PROBLEMS WITH SHOULDER SURGERY  . Dysrhythmia   .  Edema    FEET/ANKLES  . Erosive esophagitis   . Gallbladder polyp   . GERD (gastroesophageal reflux disease)   . Heart murmur   . Hepatic hemangioma   . Hepatitis    A IN PAST  . History of hiatal hernia   . Hyperlipidemia   . Mild intermittent asthma   . MVP (mitral valve prolapse)    with slight regurgitation  . Orthopnea   . Osteoporosis   . Proteinuria   . Pulmonary nodule 2000's   stable  over years--no more testing  . Sleep disturbance    chronic  . Thyroid nodule     Past Surgical History:  Procedure Laterality Date  . APPENDECTOMY    . CARPAL TUNNEL RELEASE Bilateral 1987  . CATARACT EXTRACTION W/PHACO Left 08/17/2017   Procedure: CATARACT EXTRACTION PHACO AND INTRAOCULAR LENS PLACEMENT ([facility]);  Surgeon: Eulogio Bear, MD;  Location: [facility] ORS;  Service: Ophthalmology;  Laterality: Left;  [place] 00:29.0 AP% 11.1 CDE 3.21 Fluid Pack lot # [id number]  . CATARACT EXTRACTION W/PHACO Right 09/14/2017   Procedure: CATARACT EXTRACTION PHACO AND INTRAOCULAR LENS PLACEMENT ([facility]);  Surgeon: Eulogio Bear, MD;  Location: [facility] ORS;  Service: Ophthalmology;  Laterality: Right;  [place] 00:21.0 AP% 4.7 CDE 1.00 Fluid Pack Lot # [id number]  . SHOULDER ADHESION RELEASE Bilateral 1988, 2001  . TONSILLECTOMY      Family History  Problem Relation Age of Onset  . Cancer Mother   . Hyperlipidemia Mother   . Arthritis Mother   . Hypertension Mother   . Stroke Father        hemorrhagic  . Arthritis Father   . Heart disease Sister        mitral valve repair  . Hypertension Sister   . Diabetes Neg Hx     Social History   Socioeconomic History  . Marital status: Single    Spouse name: Not on file  . Number of children: 0  . Years of education: Not on file  . Highest education level: Not on file  Occupational History  . 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 Financial resource strain: Not on file  . Food insecurity:    Worry: Not on file    Inability: Not on file  . Transportation needs:    Medical: Not on file    Non-medical: Not on file  Tobacco Use  . Smoking status: Never Smoker  . Smokeless tobacco: Never Used  Substance and Sexual Activity  . Alcohol use: No  . Drug use: No  . Sexual activity: Not on file  Lifestyle  . Physical activity:    Days per week: Not on file    Minutes per session: Not on file  . Stress:  Not on file  Relationships  . Social connections:    Talks on phone: Not on file    Gets together: Not on file    Attends religious service: Not on file    Active member of club or organization: Not on file    Attends meetings of clubs or organizations: Not on file    Relationship status: Not on file  . Intimate partner violence:    Fear of current or ex partner: Not on file    Emotionally abused: Not on file    Physically abused: Not on file    Forced sexual activity: Not on file  Other Topics Concern  . Not on file  Social History Narrative   Wants to be called [name]   Has living will   [place] are her health care POA----Alessandro Kerrie [place],   Would accept resuscitation   No tube feeds if cognitively unaware   Review of Systems  No dysuria or hematuria. Still concerned about proteinuria Appetite is okay Chronic sleep problems Wears seat belt Teeth okay---keeps up with dentist Bowels are okay--but may be smaller. No blood No other joint swelling or pain Mild ankle edema if on her feet a lot    Objective:   Physical Exam  Constitutional: She is oriented to person, place, and time. She appears well-developed. No distress.  HENT:  Mouth/Throat: Oropharynx is clear and moist. No oropharyngeal exudate.  Neck: No thyromegaly present.  Cardiovascular: Normal rate, regular rhythm, normal heart sounds and intact distal pulses. Exam reveals no gallop.  No murmur heard. Respiratory: Effort normal and breath sounds normal. No respiratory distress. She has no wheezes. She has no rales.  GI: Soft. There is no tenderness.  Musculoskeletal: She exhibits no edema.  Tenderness over left lateral epicodyle--normal ROM Normal ROM in both hips  Lymphadenopathy:    She has no cervical adenopathy.  Neurological: She is alert and oriented to person, place, and time.  President -- "[employer]" [phone] D-l-r-o-w Recall 3/3  Skin:  Slight eczematous rash  along posterior neck  Psychiatric: She has a normal mood and affect. Her behavior is normal.           Assessment & Plan:

## 2018-04-03 ENCOUNTER — Ambulatory Visit: Payer: Medicare Other | Admitting: Internal Medicine

## 2018-04-09 ENCOUNTER — Telehealth: Payer: Self-pay | Admitting: Internal Medicine

## 2018-04-09 NOTE — Telephone Encounter (Signed)
Consult order written Please fax to [facility]

## 2018-04-09 NOTE — Telephone Encounter (Signed)
Orders faxed

## 2018-04-09 NOTE — Telephone Encounter (Signed)
Best number [phone] Pt called stating she spoke with you in oct about her tennis elbow .  It is not getting any better and wanted to get a referral for  OT at [facility].

## 2018-04-16 ENCOUNTER — Encounter: Payer: Self-pay | Admitting: Internal Medicine

## 2018-04-16 ENCOUNTER — Ambulatory Visit (INDEPENDENT_AMBULATORY_CARE_PROVIDER_SITE_OTHER): Payer: Medicare Other | Admitting: Internal Medicine

## 2018-04-16 VITALS — BP 116/74 | HR 68 | Ht <= 58 in | Wt 121.5 lb

## 2018-04-16 DIAGNOSIS — K219 Gastro-esophageal reflux disease without esophagitis: Secondary | ICD-10-CM | POA: Diagnosis not present

## 2018-04-16 DIAGNOSIS — R1310 Dysphagia, unspecified: Secondary | ICD-10-CM | POA: Diagnosis not present

## 2018-04-16 DIAGNOSIS — M79602 Pain in left arm: Secondary | ICD-10-CM | POA: Diagnosis not present

## 2018-04-16 DIAGNOSIS — R531 Weakness: Secondary | ICD-10-CM | POA: Diagnosis not present

## 2018-04-16 NOTE — Patient Instructions (Signed)

## 2018-04-16 NOTE — Progress Notes (Signed)
HISTORY OF PRESENT ILLNESS:  Shelby Oliver is a 72 y.o. female relocated to [place] from [place] who is referred today by her primary care provider Dr. Silvio Pate regarding problems with reflux and dysphasia.  Patient tells me that she has an extensive GI history dating back to the 49s.  No available records though she does have her own personal notes.  Highlights include a history of esophageal spasm, esophageal motility disorder, GERD with erosive esophagitis, history of esophageal dilations, history of Helicobacter pylori which was treated.  She tells me that she had been on various PPIs over the years.  Currently on no PPI therapy due to concerns over side effects.  She does describe occasional indigestion.  Intermittent solid food dysphasia items such as bread and chicken.  No vomiting.  She also tells me that imaging studies have revealed gallbladder polyps and hepatic hemangiomas.  She tells me that she had colonoscopy about 15 years ago which was negative.  She has absolutely no interest in follow-up screening colonoscopy and understands the risk of colon cancer.  GI review of systems is otherwise negative.  Review of outside laboratories from October 2019 finds unremarkable comprehensive metabolic panel and CBC with hemoglobin 13.2.  Review of x-ray file shows no relevant abnormalities.  REVIEW OF SYSTEMS:  All non-GI ROS negative unless otherwise stated in the HPI except for allergies, arthritis, back pain, skin rash, sleeping problems, photosensitivity  Past Medical History:  Diagnosis Date  . Chronic kidney disease    RENAL INSUFF  . Complication of anesthesia    RESPIRATORY PROBLEMS WITH SHOULDER SURGERY  . Dysrhythmia   . Edema    FEET/ANKLES  . Erosive esophagitis   . Gallbladder polyp   . GERD (gastroesophageal reflux disease)   . Heart murmur   . Hepatic hemangioma   . Hepatitis    A IN PAST  . History of hiatal hernia   . Hyperlipidemia   . Mild intermittent  asthma   . MVP (mitral valve prolapse)    with slight regurgitation  . Orthopnea   . Osteoporosis   . Proteinuria   . Pulmonary nodule 2000's   stable over years--no more testing  . Sleep disturbance    chronic  . Thyroid nodule     Past Surgical History:  Procedure Laterality Date  . APPENDECTOMY    . CARPAL TUNNEL RELEASE Bilateral 1987  . CATARACT EXTRACTION W/PHACO Left 08/17/2017   Procedure: CATARACT EXTRACTION PHACO AND INTRAOCULAR LENS PLACEMENT (IOC);  Surgeon: Eulogio Bear, MD;  Location: [facility] ORS;  Service: Ophthalmology;  Laterality: Left;  [place] 00:29.0 AP% 11.1 CDE 3.21 Fluid Pack lot # [id number]  . CATARACT EXTRACTION W/PHACO Right 09/14/2017   Procedure: CATARACT EXTRACTION PHACO AND INTRAOCULAR LENS PLACEMENT (IOC);  Surgeon: Eulogio Bear, MD;  Location: [facility] ORS;  Service: Ophthalmology;  Laterality: Right;  [place] 00:21.0 AP% 4.7 CDE 1.00 Fluid Pack Lot # [id number]  . SHOULDER ADHESION RELEASE Bilateral 1988, 2001  . TONSILLECTOMY      Social History Naureen Benton  reports that she has never smoked. She has never used smokeless tobacco. She reports that she does not drink alcohol or use drugs.  family history includes Arthritis in her father and mother; Cancer in her mother; Heart disease in her sister; Hyperlipidemia in her mother; Hypertension in her mother and sister; Stroke in her father.  Allergies  Allergen Reactions  . Avelox [Moxifloxacin Hcl In Nacl] Hives  . Ciprocin-Fluocin-Procin [Fluocinolone]   .  Penicillins Hives    Has patient had a PCN reaction causing immediate rash, facial/tongue/throat swelling, SOB or lightheadedness with hypotension: no Has patient had a PCN reaction causing severe rash involving mucus membranes or skin necrosis: no Has patient had a PCN reaction that required hospitalization: no Has patient had a PCN reaction occurring within the last 10 years: no If all of the above answers are "NO", then may proceed with  Cephalosporin use.   . Quinolones Hives       PHYSICAL EXAMINATION: Vital signs: BP 116/74   Pulse 68   Ht 4\' 7"  (1.397 m)   Wt 121 lb 8 oz (55.1 kg)   BMI 28.24 kg/m   Constitutional: generally well-appearing, no acute distress Psychiatric: alert and oriented x3, cooperative Eyes: extraocular movements intact, anicteric, conjunctiva pink Mouth: oral pharynx moist, no lesions Neck: supple no lymphadenopathy Cardiovascular: heart regular rate and rhythm, no murmur Lungs: clear to auscultation bilaterally Abdomen: soft, nontender, nondistended, no obvious ascites, no peritoneal signs, normal bowel sounds, no organomegaly Rectal: Omitted Extremities: no clubbing, cyanosis, or lower extremity edema bilaterally Skin: no lesions on visible extremities Neuro: No focal deficits.  Cranial nerves intact  ASSESSMENT:  1.  GERD.  Intermittent symptoms.  On no therapy per patient's desire 2.  Intermittent solid food dysphagia.  Rule out peptic stricture 3.  Reported history of motility disorder 4.  Previous colonoscopy about 15 years ago reported as negative.  Patient has no interest for repeat screening and understands the risks   PLAN:  1.  Reflux precautions 2.  Upper endoscopy with probable esophageal dilation.The nature of the procedure, as well as the risks, benefits, and alternatives were carefully and thoroughly reviewed with the patient. Ample time for discussion and questions allowed. The patient understood, was satisfied, and agreed to proceed. 3.  Consider PPI therapy if active inflammation or other conditions that require PPI therapy.  She would be agreeable if important

## 2018-04-17 DIAGNOSIS — R531 Weakness: Secondary | ICD-10-CM | POA: Diagnosis not present

## 2018-04-17 DIAGNOSIS — M79602 Pain in left arm: Secondary | ICD-10-CM | POA: Diagnosis not present

## 2018-04-19 DIAGNOSIS — R531 Weakness: Secondary | ICD-10-CM | POA: Diagnosis not present

## 2018-04-19 DIAGNOSIS — M79602 Pain in left arm: Secondary | ICD-10-CM | POA: Diagnosis not present

## 2018-04-23 DIAGNOSIS — M79602 Pain in left arm: Secondary | ICD-10-CM | POA: Diagnosis not present

## 2018-04-23 DIAGNOSIS — R531 Weakness: Secondary | ICD-10-CM | POA: Diagnosis not present

## 2018-04-25 DIAGNOSIS — R531 Weakness: Secondary | ICD-10-CM | POA: Diagnosis not present

## 2018-04-25 DIAGNOSIS — M79602 Pain in left arm: Secondary | ICD-10-CM | POA: Diagnosis not present

## 2018-04-27 DIAGNOSIS — M79602 Pain in left arm: Secondary | ICD-10-CM | POA: Diagnosis not present

## 2018-04-27 DIAGNOSIS — R531 Weakness: Secondary | ICD-10-CM | POA: Diagnosis not present

## 2018-04-30 DIAGNOSIS — M79602 Pain in left arm: Secondary | ICD-10-CM | POA: Diagnosis not present

## 2018-04-30 DIAGNOSIS — R531 Weakness: Secondary | ICD-10-CM | POA: Diagnosis not present

## 2018-05-03 DIAGNOSIS — R531 Weakness: Secondary | ICD-10-CM | POA: Diagnosis not present

## 2018-05-03 DIAGNOSIS — M79602 Pain in left arm: Secondary | ICD-10-CM | POA: Diagnosis not present

## 2018-05-04 DIAGNOSIS — R531 Weakness: Secondary | ICD-10-CM | POA: Diagnosis not present

## 2018-05-04 DIAGNOSIS — M79602 Pain in left arm: Secondary | ICD-10-CM | POA: Diagnosis not present

## 2018-05-07 DIAGNOSIS — M79602 Pain in left arm: Secondary | ICD-10-CM | POA: Diagnosis not present

## 2018-05-07 DIAGNOSIS — R531 Weakness: Secondary | ICD-10-CM | POA: Diagnosis not present

## 2018-05-09 DIAGNOSIS — R531 Weakness: Secondary | ICD-10-CM | POA: Diagnosis not present

## 2018-05-09 DIAGNOSIS — M79602 Pain in left arm: Secondary | ICD-10-CM | POA: Diagnosis not present

## 2018-05-10 DIAGNOSIS — R531 Weakness: Secondary | ICD-10-CM | POA: Diagnosis not present

## 2018-05-10 DIAGNOSIS — M79602 Pain in left arm: Secondary | ICD-10-CM | POA: Diagnosis not present

## 2018-05-11 ENCOUNTER — Encounter: Payer: Self-pay | Admitting: Internal Medicine

## 2018-05-11 ENCOUNTER — Ambulatory Visit (AMBULATORY_SURGERY_CENTER): Payer: Medicare Other | Admitting: Internal Medicine

## 2018-05-11 VITALS — BP 121/82 | HR 65 | Temp 97.1°F | Resp 13 | Ht <= 58 in | Wt 121.0 lb

## 2018-05-11 DIAGNOSIS — K219 Gastro-esophageal reflux disease without esophagitis: Secondary | ICD-10-CM

## 2018-05-11 DIAGNOSIS — J45909 Unspecified asthma, uncomplicated: Secondary | ICD-10-CM | POA: Diagnosis not present

## 2018-05-11 DIAGNOSIS — R1310 Dysphagia, unspecified: Secondary | ICD-10-CM

## 2018-05-11 DIAGNOSIS — K222 Esophageal obstruction: Secondary | ICD-10-CM

## 2018-05-11 DIAGNOSIS — E669 Obesity, unspecified: Secondary | ICD-10-CM | POA: Diagnosis not present

## 2018-05-11 DIAGNOSIS — K449 Diaphragmatic hernia without obstruction or gangrene: Secondary | ICD-10-CM | POA: Diagnosis not present

## 2018-05-11 DIAGNOSIS — I10 Essential (primary) hypertension: Secondary | ICD-10-CM | POA: Diagnosis not present

## 2018-05-11 MED ORDER — OMEPRAZOLE 40 MG PO CPDR: 40.0000 mg | DELAYED_RELEASE_CAPSULE | Freq: Every day | ORAL | 11 refills | Status: DC

## 2018-05-11 MED ORDER — SODIUM CHLORIDE 0.9 % IV SOLN: 500.0000 mL | Freq: Once | INTRAVENOUS | Status: DC

## 2018-05-11 NOTE — Op Note (Signed)
[facility] Patient Name: Shelby Oliver Procedure Date: 05/11/2018 9:57 AM MRN: [id number] Endoscopist: Docia Chuck. Henrene Pastor , MD Age: 73 Referring MD:  Date of Birth: April 25, 1946 Gender: Female Account #: [id number] Procedure:                Upper GI endoscopy with balloon dilation of the                            esophagus. 18-20 mm Indications:              Dysphagia, Esophageal reflux Medicines:                Monitored Anesthesia Care Procedure:                Pre-Anesthesia Assessment:                           - Prior to the procedure, a History and Physical                            was performed, and patient medications and                            allergies were reviewed. The patient's tolerance of                            previous anesthesia was also reviewed. The risks                            and benefits of the procedure and the sedation                            options and risks were discussed with the patient.                            All questions were answered, and informed consent                            was obtained. Prior Anticoagulants: The patient has                            taken no previous anticoagulant or antiplatelet                            agents. ASA Grade Assessment: II - A patient with                            mild systemic disease. After reviewing the risks                            and benefits, the patient was deemed in                            satisfactory condition to undergo the procedure.  After obtaining informed consent, the endoscope was                            passed under direct vision. Throughout the                            procedure, the patient's blood pressure, pulse, and                            oxygen saturations were monitored continuously. The                            Model GIF-[id number]) scope was introduced                            through the mouth, and  advanced to the second part                            of duodenum. The upper GI endoscopy was                            accomplished without difficulty. The patient                            tolerated the procedure well. Scope In: Scope Out: Findings:                 One benign-appearing, intrinsic moderate stenosis                            was found 33 cm from the incisors. This stenosis                            measured 1.5 cm (inner diameter). The stenosis was                            traversed. There was associated esophagitis as                            manifested by friable edematous mucosa. No                            Barrett's.. A TTS dilator was passed through the                            scope. Dilation with an 18-19-20 mm balloon dilator                            was performed to 20 mm.                           The stomach was normal except for a 3 to 4 cm  sliding hiatal hernia.                           The examined duodenum was normal.                           The cardia and gastric fundus were normal on                            retroflexion. Complications:            No immediate complications. Estimated Blood Loss:     Estimated blood loss: none. Impression:               1. GERD with esophagitis and esophageal stricture                            status post dilation                           2. Otherwise unremarkable EGD. Recommendation:           - Patient has a contact number available for                            emergencies. The signs and symptoms of potential                            delayed complications were discussed with the                            patient. Return to normal activities tomorrow.                            Written discharge instructions were provided to the                            patient.                           - Post dilation diet.                           - Prescribe omeprazole 40  mg daily; #30; 11 refills                           - Office follow-up with Dr. Henrene Pastor in 6 to 8 weeks. Docia Chuck. Henrene Pastor, MD 05/11/2018 10:18:40 AM This report has been signed electronically.

## 2018-05-11 NOTE — Progress Notes (Signed)
Report to PACU, RN, vss, BBS= Clear.  

## 2018-05-11 NOTE — Progress Notes (Signed)
Called to room to assist during endoscopic procedure.  Patient ID and intended procedure confirmed with present staff. Received instructions for my participation in the procedure from the performing physician.  

## 2018-05-11 NOTE — Progress Notes (Signed)
Pt's states no medical or surgical changes since previsit or office visit. 

## 2018-05-11 NOTE — Patient Instructions (Signed)
Discharge instructions given. Handouts on a Dilatation diet,Gerd, Hiatal hernia. Prescription sent to pharmacy. Office will call to schedule follow up with Dr. Henrene Pastor in 6 to 8 weeks. YOU HAD AN ENDOSCOPIC PROCEDURE TODAY AT [facility]:   Refer to the procedure report that was given to you for any specific questions about what was found during the examination.  If the procedure report does not answer your questions, please call your gastroenterologist to clarify.  If you requested that your care partner not be given the details of your procedure findings, then the procedure report has been included in a sealed envelope for you to review at your convenience later.  YOU SHOULD EXPECT: Some feelings of bloating in the abdomen. Passage of more gas than usual.  Walking can help get rid of the air that was put into your GI tract during the procedure and reduce the bloating. If you had a lower endoscopy (such as a colonoscopy or flexible sigmoidoscopy) you may notice spotting of blood in your stool or on the toilet paper. If you underwent a bowel prep for your procedure, you may not have a normal bowel movement for a few days.  Please Note:  You might notice some irritation and congestion in your nose or some drainage.  This is from the oxygen used during your procedure.  There is no need for concern and it should clear up in a day or so.  SYMPTOMS TO REPORT IMMEDIATELY:    Following upper endoscopy (EGD)  Vomiting of blood or coffee ground material  New chest pain or pain under the shoulder blades  Painful or persistently difficult swallowing  New shortness of breath  Fever of 100F or higher  Black, tarry-looking stools  For urgent or emergent issues, a gastroenterologist can be reached at any hour by calling [phone].   DIET:  We do recommend a small meal at first, but then you may proceed to your regular diet.  Drink plenty of fluids but you should avoid alcoholic  beverages for 24 hours.  ACTIVITY:  You should plan to take it easy for the rest of today and you should NOT DRIVE or use heavy machinery until tomorrow (because of the sedation medicines used during the test).    FOLLOW UP: Our staff will call the number listed on your records the next business day following your procedure to check on you and address any questions or concerns that you may have regarding the information given to you following your procedure. If we do not reach you, we will leave a message.  However, if you are feeling well and you are not experiencing any problems, there is no need to return our call.  We will assume that you have returned to your regular daily activities without incident.  If any biopsies were taken you will be contacted by phone or by letter within the next 1-3 weeks.  Please call us at [phone] if you have not heard about the biopsies in 3 weeks.    SIGNATURES/CONFIDENTIALITY: You and/or your care partner have signed paperwork which will be entered into your electronic medical record.  These signatures attest to the fact that that the information above on your After Visit Summary has been reviewed and is understood.  Full responsibility of the confidentiality of this discharge information lies with you and/or your care-partner.

## 2018-05-14 ENCOUNTER — Telehealth: Payer: Self-pay

## 2018-05-14 DIAGNOSIS — R531 Weakness: Secondary | ICD-10-CM | POA: Diagnosis not present

## 2018-05-14 DIAGNOSIS — M79602 Pain in left arm: Secondary | ICD-10-CM | POA: Diagnosis not present

## 2018-05-14 NOTE — Telephone Encounter (Signed)
  Follow up Call-  Call back number 05/11/2018  Post procedure Call Back phone  # [phone]  Permission to leave phone message Yes  Some recent data might be hidden     Patient questions:  Do you have a fever, pain , or abdominal swelling? No. Pain Score  0 *  Have you tolerated food without any problems? Yes.    Have you been able to return to your normal activities? Yes.    Do you have any questions about your discharge instructions: Diet   No. Medications  No. Follow up visit  No.  Do you have questions or concerns about your Care? No.  Actions: * If pain score is 4 or above: No action needed, pain <4.

## 2018-05-16 DIAGNOSIS — M79602 Pain in left arm: Secondary | ICD-10-CM | POA: Diagnosis not present

## 2018-05-16 DIAGNOSIS — R531 Weakness: Secondary | ICD-10-CM | POA: Diagnosis not present

## 2018-05-17 NOTE — Telephone Encounter (Signed)
Please schedule her in with Dr Lorelei Pont

## 2018-05-23 DIAGNOSIS — H1013 Acute atopic conjunctivitis, bilateral: Secondary | ICD-10-CM | POA: Diagnosis not present

## 2018-05-27 NOTE — Progress Notes (Signed)
Dr. Frederico Hamman T. Ziva Nunziata, MD, [facility] [place], [address] Phone: [phone] Fax: [phone]  05/28/2018  Patient: Shelby Oliver, MRN: [id number], DOB: [birth year], 73 y.o.  Primary Physician:  Venia Carbon, MD   Chief Complaint  Patient presents with  . Elbow Pain    Left   Subjective:   Shelby Oliver is a 73 y.o. very pleasant female patient who presents with the following:  Patient of Dr. Silvio Pate - a few months ago she had been complaining of both L elbow and L shoulder pain.   Has had two different shoulder surgeries. [place] and DCE arthscopically.  CTS B 7 or 8 trigger fingers Ganglion cyst removals.   L wrist hurts more. In the summer time started some stretching exercises at [facility].   She has a MRI from 2014 of her left wrist, and this shows an incomplete tear of the scapholunate ligament as well as some degenerative changes at the TFCC.  Two times a week,  Had some pain with supination - started in 8/9 - 2020. 4 or 5 months Also had some numbness in her fingers.  PT: she was icing, warm compresses. Did some E-stim, some manual therapy.  Did some weight exercises with a 1 pound weight.   1988 and 2000 (R and L) shoulder surgeries.  L LE and ME: Right now she is having the greatest pain at the lateral epicondyle and pain with wrist extension as well as supination.  She also to a lesser degree has pain at the medial epicondyle.  Can't take nsaids - erosive esophagitis    Past Medical History, Surgical History, Social History, Family History, Problem List, Medications, and Allergies have been reviewed and updated if relevant.  Patient Active Problem List   Diagnosis Date Noted  . Acute non-recurrent pansinusitis 06/06/2017  . Dry eye syndrome 06/06/2017  . Preventative health care 02/18/2015  . Advance directive discussed with patient 02/18/2015  . Chronic back pain 02/18/2015  .  Hyperlipidemia   . Mild intermittent asthma   . MVP (mitral valve prolapse)   . Thyroid nodule   . Proteinuria   . Osteoporosis   . Erosive esophagitis   . Sleep disturbance     Past Medical History:  Diagnosis Date  . Chronic kidney disease    RENAL INSUFF  . Complication of anesthesia    RESPIRATORY PROBLEMS WITH SHOULDER SURGERY  . Dysrhythmia   . Edema    FEET/ANKLES  . Erosive esophagitis   . Gallbladder polyp   . GERD (gastroesophageal reflux disease)   . Heart murmur   . Hepatic hemangioma   . Hepatitis    A IN PAST  . History of hiatal hernia   . Hyperlipidemia   . Mild intermittent asthma   . MVP (mitral valve prolapse)    with slight regurgitation  . Orthopnea   . Osteoporosis   . Proteinuria   . Pulmonary nodule 2000's   stable over years--no more testing  . Sleep disturbance    chronic  . Thyroid nodule     Past Surgical History:  Procedure Laterality Date  . APPENDECTOMY    . CARPAL TUNNEL RELEASE Bilateral 1987  . CATARACT EXTRACTION W/PHACO Left 08/17/2017   Procedure: CATARACT EXTRACTION PHACO AND INTRAOCULAR LENS PLACEMENT (IOC);  Surgeon: Eulogio Bear, MD;  Location: [facility] ORS;  Service: Ophthalmology;  Laterality: Left;  [place] 00:29.0 AP% 11.1 CDE 3.21 Fluid Pack lot #  [id number]  . CATARACT EXTRACTION W/PHACO Right 09/14/2017   Procedure: CATARACT EXTRACTION PHACO AND INTRAOCULAR LENS PLACEMENT (IOC);  Surgeon: Eulogio Bear, MD;  Location: [facility] ORS;  Service: Ophthalmology;  Laterality: Right;  [place] 00:21.0 AP% 4.7 CDE 1.00 Fluid Pack Lot # [id number]  . SHOULDER ADHESION RELEASE Bilateral 1988, 2001  . TONSILLECTOMY      Social History   Socioeconomic History  . Marital status: Single    Spouse name: Not on file  . Number of children: 0  . Years of education: Not on file  . Highest education level: Not on file  Occupational History  . 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  Financial resource strain: Not on file  . Food insecurity:    Worry: Not on file    Inability: Not on file  . Transportation needs:    Medical: Not on file    Non-medical: Not on file  Tobacco Use  . Smoking status: Never Smoker  . Smokeless tobacco: Never Used  Substance and Sexual Activity  . Alcohol use: No  . Drug use: No  . Sexual activity: Not on file  Lifestyle  . Physical activity:    Days per week: Not on file    Minutes per session: Not on file  . Stress: Not on file  Relationships  . Social connections:    Talks on phone: Not on file    Gets together: Not on file    Attends religious service: Not on file    Active member of club or organization: Not on file    Attends meetings of clubs or organizations: Not on file    Relationship status: Not on file  . Intimate partner violence:    Fear of current or ex partner: Not on file    Emotionally abused: Not on file    Physically abused: Not on file    Forced sexual activity: Not on file  Other Topics Concern  . Not on file  Social History Narrative   Wants to be called [name]   Has living will   [place] are her health care POA----Shelby Oliver,   Would accept resuscitation   No tube feeds if cognitively unaware    Family History  Problem Relation Age of Onset  . Cancer Mother   . Hyperlipidemia Mother   . Arthritis Mother   . Hypertension Mother   . Stroke Father        hemorrhagic  . Arthritis Father   . Heart disease Sister        mitral valve repair  . Hypertension Sister   . Diabetes Neg Hx     Allergies  Allergen Reactions  . Avelox [Moxifloxacin Hcl In Nacl] Hives  . Ciprocin-Fluocin-Procin [Fluocinolone]   . Penicillins Hives    Has patient had a PCN reaction causing immediate rash, facial/tongue/throat swelling, SOB or lightheadedness with hypotension: no Has patient had a PCN reaction causing severe rash involving mucus membranes or skin necrosis: no Has patient had a  PCN reaction that required hospitalization: no Has patient had a PCN reaction occurring within the last 10 years: no If all of the above answers are "NO", then may proceed with Cephalosporin use.   . Quinolones Hives    Medication list reviewed and updated in full in [facility].  GEN: No fevers, chills. Nontoxic. Primarily MSK c/o today. MSK: Detailed in the HPI GI: tolerating PO  intake without difficulty Neuro: No numbness, parasthesias, or tingling associated. Otherwise the pertinent positives of the ROS are noted above.   Objective:   BP 110/60   Pulse 81   Temp 98.3 F (36.8 C) (Oral)   Ht 4' 6.75" (1.391 m)   Wt 124 lb (56.2 kg)   BMI 29.08 kg/m    GEN: WDWN, NAD, Non-toxic, Alert & Oriented x 3 HEENT: Atraumatic, Normocephalic.  Ears and Nose: No external deformity. EXTR: No clubbing/cyanosis/edema NEURO: Normal gait.  PSYCH: Normally interactive. Conversant. Not depressed or anxious appearing.  Calm demeanor.   Elbow: L Ecchymosis or edema: neg ROM: full flexion, extension, pronation, supination Shoulder ROM: Full Flexion: 5/5 Extension: 5/5, PAINFUL Supination: 5/5, PAINFUL Pronation: 5/5 Wrist ext: 5/5 Wrist flexion: 5/5 No gross bony abnormality Varus and Valgus stress: stable ECRB tenderness: YES, TTP Medial epicondyle: TTP Lateral epicondyle, resisted wrist extension from wrist full pronation and flexion: PAINFUL Supination is painful grip: 5/5  sensation intact Tinel's, Elbow: negative    Radiology: No results found.  Assessment and Plan:   Left lateral epicondylitis - Plan: Ambulatory referral to Occupational Therapy  Medial epicondylitis of left elbow - Plan: Ambulatory referral to Occupational Therapy  Pretty significant lateral epicondylitis and to a lesser extent medial epicondylitis of the left elbow.  Currently her wrist is essentially asymptomatic.  Her shoulder moves very well and she does not have any active inducible  shoulder problem.  Change her occupational therapy orders to include some Decadron phonophoresis and iontophoresis and to include some manual therapy such as ASTYMM or Grashey.  I am going to have the patient start with some basic isometrics now.  Follow-up: Return in about 6 weeks (around 07/09/2018).  Meds ordered this encounter  Medications  . diclofenac sodium (VOLTAREN) 1 % GEL    Sig: Apply 2 g topically 4 (four) times daily.    Dispense:  3 Tube    Refill:  5   Orders Placed This Encounter  Procedures  . Ambulatory referral to Occupational Therapy    Signed,  Frederico Hamman T. Tegan Britain, MD   Outpatient Encounter Medications as of 05/28/2018  Medication Sig  . acetaminophen (TYLENOL) 500 MG tablet Take 1,000 mg by mouth every 8 (eight) hours as needed.  Marland Kitchen albuterol (PROVENTIL HFA;VENTOLIN HFA) 108 (90 Base) MCG/ACT inhaler Inhale 2 puffs into the lungs every 6 (six) hours as needed for wheezing or shortness of breath.  Marland Kitchen atorvastatin (LIPITOR) 10 MG tablet TAKE 1 TABLET(10 MG) BY MOUTH DAILY  . calcium carbonate (TUMS - DOSED IN MG ELEMENTAL CALCIUM) 500 MG chewable tablet Chew 1 tablet by mouth as needed for indigestion or heartburn.  . cholecalciferol (VITAMIN D) 1000 units tablet Take 5,000 Units by mouth once a week. Sundays  . metoprolol succinate (TOPROL-XL) 25 MG 24 hr tablet TAKE 1 TABLET(25 MG) BY MOUTH DAILY  . montelukast (SINGULAIR) 10 MG tablet TAKE 1 TABLET(10 MG) BY MOUTH DAILY  . omeprazole (PRILOSEC) 40 MG capsule Take 1 capsule (40 mg total) by mouth daily.  . diclofenac sodium (VOLTAREN) 1 % GEL Apply 2 g topically 4 (four) times daily.   No facility-administered encounter medications on file as of 05/28/2018.

## 2018-05-28 ENCOUNTER — Telehealth: Payer: Self-pay | Admitting: Internal Medicine

## 2018-05-28 ENCOUNTER — Ambulatory Visit (INDEPENDENT_AMBULATORY_CARE_PROVIDER_SITE_OTHER): Payer: Medicare Other | Admitting: Family Medicine

## 2018-05-28 ENCOUNTER — Encounter: Payer: Self-pay | Admitting: Family Medicine

## 2018-05-28 VITALS — BP 110/60 | HR 81 | Temp 98.3°F | Ht <= 58 in | Wt 124.0 lb

## 2018-05-28 DIAGNOSIS — M7712 Lateral epicondylitis, left elbow: Secondary | ICD-10-CM | POA: Diagnosis not present

## 2018-05-28 DIAGNOSIS — M7702 Medial epicondylitis, left elbow: Secondary | ICD-10-CM | POA: Diagnosis not present

## 2018-05-28 MED ORDER — DICLOFENAC SODIUM 1 % TD GEL: 2.0000 g | Freq: Four times a day (QID) | TRANSDERMAL | 5 refills | Status: DC

## 2018-05-28 NOTE — Telephone Encounter (Signed)
New orders written.

## 2018-05-28 NOTE — Telephone Encounter (Signed)
Shelby Oliver is needing the orders changed for OT to Physical Therapy to treat pt's epicondylitis. Please fax to [phone]

## 2018-05-29 NOTE — Telephone Encounter (Signed)
Orders faxed

## 2018-05-31 ENCOUNTER — Other Ambulatory Visit (INDEPENDENT_AMBULATORY_CARE_PROVIDER_SITE_OTHER): Payer: Medicare Other

## 2018-05-31 DIAGNOSIS — Z1211 Encounter for screening for malignant neoplasm of colon: Secondary | ICD-10-CM | POA: Diagnosis not present

## 2018-05-31 LAB — FECAL OCCULT BLOOD, IMMUNOCHEMICAL: Fecal Occult Bld: NEGATIVE

## 2018-06-06 DIAGNOSIS — M7702 Medial epicondylitis, left elbow: Secondary | ICD-10-CM | POA: Diagnosis not present

## 2018-06-08 NOTE — Addendum Note (Signed)
Addended by: Owens Loffler on: 06/08/2018 02:23 PM   Modules accepted: Orders

## 2018-06-19 ENCOUNTER — Ambulatory Visit: Payer: Medicare Other | Attending: Family Medicine | Admitting: Occupational Therapy

## 2018-06-19 ENCOUNTER — Other Ambulatory Visit: Payer: Self-pay

## 2018-06-19 ENCOUNTER — Encounter: Payer: Self-pay | Admitting: Occupational Therapy

## 2018-06-19 DIAGNOSIS — G5622 Lesion of ulnar nerve, left upper limb: Secondary | ICD-10-CM | POA: Insufficient documentation

## 2018-06-19 DIAGNOSIS — M7712 Lateral epicondylitis, left elbow: Secondary | ICD-10-CM

## 2018-06-19 DIAGNOSIS — M79602 Pain in left arm: Secondary | ICD-10-CM | POA: Insufficient documentation

## 2018-06-19 DIAGNOSIS — M542 Cervicalgia: Secondary | ICD-10-CM | POA: Insufficient documentation

## 2018-06-19 DIAGNOSIS — M6281 Muscle weakness (generalized): Secondary | ICD-10-CM | POA: Diagnosis not present

## 2018-06-19 NOTE — Patient Instructions (Signed)
Educated about cubital tunnel - positions and activities to avoid and modify Ed on HEP for lateral epicondyle:  ed on use of counter force splint correctly and fitted with wrist splint  Heat Massage cross friction AROM stretch for extensors of forearm - elbow to side 5 reps hold 5 sec Ice   Modify act  Upper traps can do heat and upper trap and scalenes stretch - 10 reps  Hold 3-5 sec on the L

## 2018-06-19 NOTE — Therapy (Signed)
[facility] [address] [place], [address] Phone: [phone]   Fax:  [phone]  Occupational Therapy Evaluation  Patient Details  Name: Shelby Oliver MRN: [id number] Date of Birth: Feb 02, 1946 Referring Provider (OT): Copland   Encounter Date: 06/19/2018  OT End of Session - 06/19/18 1951    Visit Number  1    Number of Visits  10    Date for OT Re-Evaluation  07/31/18    OT Start Time  0930    OT Stop Time  1045    OT Time Calculation (min)  75 min    Activity Tolerance  Patient tolerated treatment well    Behavior During Therapy  [facility] for tasks assessed/performed       Past Medical History:  Diagnosis Date  . Chronic kidney disease    RENAL INSUFF  . Complication of anesthesia    RESPIRATORY PROBLEMS WITH SHOULDER SURGERY  . Dysrhythmia   . Edema    FEET/ANKLES  . Erosive esophagitis   . Gallbladder polyp   . GERD (gastroesophageal reflux disease)   . Heart murmur   . Hepatic hemangioma   . Hepatitis    A IN PAST  . History of hiatal hernia   . Hyperlipidemia   . Mild intermittent asthma   . MVP (mitral valve prolapse)    with slight regurgitation  . Orthopnea   . Osteoporosis   . Proteinuria   . Pulmonary nodule 2000's   stable over years--no more testing  . Sleep disturbance    chronic  . Thyroid nodule     Past Surgical History:  Procedure Laterality Date  . APPENDECTOMY    . CARPAL TUNNEL RELEASE Bilateral 1987  . CATARACT EXTRACTION W/PHACO Left 08/17/2017   Procedure: CATARACT EXTRACTION PHACO AND INTRAOCULAR LENS PLACEMENT (IOC);  Surgeon: Eulogio Bear, MD;  Location: [facility] ORS;  Service: Ophthalmology;  Laterality: Left;  [place] 00:29.0 AP% 11.1 CDE 3.21 Fluid Pack lot # [id number]  . CATARACT EXTRACTION W/PHACO Right 09/14/2017   Procedure: CATARACT EXTRACTION PHACO AND INTRAOCULAR LENS PLACEMENT (IOC);  Surgeon: Eulogio Bear, MD;  Location: [facility] ORS;  Service:  Ophthalmology;  Laterality: Right;  [place] 00:21.0 AP% 4.7 CDE 1.00 Fluid Pack Lot # [id number]  . SHOULDER ADHESION RELEASE Bilateral 1988, 2001  . TONSILLECTOMY      There were no vitals filed for this visit.  Subjective Assessment - 06/19/18 1932    Subjective   My symptoms started about last summer after doing some light exercises at [facility] with bands - but since then got worse - did some therapy at [facility] but the last few days worse again - and it is my elbow down to my hand - numbness at times in my pinkie and ring finger and then pain up to my shoulder too     Patient Stated Goals  I want the pain better so I can use my arm again like before -     Currently in Pain?  Yes    Pain Score  6     Pain Location  Arm    Pain Orientation  Left    Pain Descriptors / Indicators  Aching;Throbbing;Tightness;Pins and needles    Pain Type  Acute pain    Pain Onset  More than a month ago    Pain Frequency  Constant        [facility] OT Assessment - 06/19/18 0001  Assessment   Medical Diagnosis  L lateral epiconylitis and cubital tunnel     Referring Provider (OT)  Copland    Onset Date/Surgical Date  10/09/17    Hand Dominance  Right      Precautions   Required Braces or Orthoses  --   counter force brace     Home  Environment   Lives With  Alone      Prior Function   Vocation  Retired    Leisure  Doing things around the house ,       AROM   Left Shoulder Flexion  100 Degrees   pain upper traps - elevation of shoulder    Left Shoulder ABduction  90 Degrees   elevation of shoulder - pain upper traps      Strength   Right Hand Grip (lbs)  30   extended arm 31   Left Hand Grip (lbs)  19   extended arm 10     Review of HEP - done heat , cross friction massage and then ice  See hand out    Educated about cubital tunnel - positions and activities to avoid and modify Ed on HEP for lateral epicondyle:  ed on use of counter force splint correctly and fitted with wrist  splint  Heat Massage cross friction AROM stretch for extensors of forearm - elbow to side 5 reps hold 5 sec Ice   Modify act  Upper traps can do heat and upper trap and scalenes stretch - 10 reps  Hold 3-5 sec on the L                OT Education - 06/19/18 1951    Education Details  findings of eval and HEP    Person(s) Educated  Patient    Methods  Explanation;Demonstration;Handout    Comprehension  Verbalized understanding;Returned demonstration       OT Short Term Goals - 06/19/18 1957      OT SHORT TERM GOAL #1   Title  Pain in L elbow degrees to less than 2/10 with AROM during ADL's     Baseline  pain at elbow 6-7/10     Time  3    Period  Weeks    Status  New    Target Date  07/10/18      OT SHORT TERM GOAL #2   Title  Pt report numbness in L arm to decrease to less than 3 x week  and negative Tinel     Baseline  Pt has numbness in L elbow to 4th and 5th digits multiple times day and Positive tinel and tender     Time  4    Period  Weeks    Status  New    Target Date  07/17/18      OT SHORT TERM GOAL #3   Title  Pain and tightness in L upper traps decrease to less than 1/10 with cervical AROM to prior level of function     Baseline  pain with lateral and rotaion to R , tender 6/10 and tightness /spasm     Time  3    Period  Weeks    Status  New    Target Date  07/10/18        OT Long Term Goals - 06/19/18 2002      OT LONG TERM GOAL #1   Title  L elbow pain and AROM improve to [facility] to perform ADL's with pain  less than 2/10     Baseline  pain 6-7/0 , with any activities and AROM of L arm     Time  6    Period  Weeks    Status  New    Target Date  07/31/18      OT LONG TERM GOAL #2   Title  Grip strength on the L be same with elbow to side and extended arm with no increase symptoms at elbow     Baseline  R grip elbow to side 30 and extended arm 31 - and L 19 and extended arm 10 lbs     Time  6    Period  Weeks    Status  New    Target  Date  07/31/18            Plan - 06/19/18 1952    Clinical Impression Statement  Pt present at OT eval with L lateral epicondylitis and cubital tunnel - tenderness and pain over lateral epicondyle , pain with wrist extentin and sup , pain in cubital tunnel and positive Tinel , numbness into 4thand 5th per pt -with reaching pt elevate shoulder and upper traps on L tender and painfull - pt can benefit from OT servides to decrease pain and increase functionall use     Occupational performance deficits (Please refer to evaluation for details):  ADL's;IADL's;Rest and Sleep;Play;Leisure    Rehab Potential  Good    Current Impairments/barriers affecting progress:  Pt had in past bilateral shoulder surgeries and cervical issues - cyst removed and multiple trigger fingers     OT Frequency  2x / week    OT Duration  6 weeks    OT Treatment/Interventions  Self-care/ADL training;Cryotherapy;Moist Heat;Ultrasound;Iontophoresis;Therapeutic exercise;Patient/family education;Passive range of motion;Manual Therapy;Splinting    Plan  assess progress with HEP -and review again and modify as needed     Clinical Decision Making  Multiple treatment options, significant modification of task necessary    OT Home Exercise Plan  see pt instruction     Consulted and Agree with Plan of Care  Patient       Patient will benefit from skilled therapeutic intervention in order to improve the following deficits and impairments:  Pain, Decreased range of motion, Decreased strength, Impaired flexibility, Impaired UE functional use, Increased muscle spasms, Impaired sensation  Visit Diagnosis: Pain in left arm - Plan: Ot plan of care cert/re-cert  Muscle weakness (generalized) - Plan: Ot plan of care cert/re-cert  Lateral epicondylitis, left elbow - Plan: Ot plan of care cert/re-cert  Cubital canal compression syndrome, left - Plan: Ot plan of care cert/re-cert  Cervicalgia - Plan: Ot plan of care  cert/re-cert    Problem List Patient Active Problem List   Diagnosis Date Noted  . Acute non-recurrent pansinusitis 06/06/2017  . Dry eye syndrome 06/06/2017  . Preventative health care 02/18/2015  . Advance directive discussed with patient 02/18/2015  . Chronic back pain 02/18/2015  . Hyperlipidemia   . Mild intermittent asthma   . MVP (mitral valve prolapse)   . Thyroid nodule   . Proteinuria   . Osteoporosis   . Erosive esophagitis   . Sleep disturbance     Rosalyn Gess OTR/L,CLT 06/19/2018, 8:13 PM  [facility] [address] [place], [address] Phone: [phone]   Fax:  [phone]  Name: Shelby Oliver MRN: [id number] Date of Birth: [birth year]

## 2018-06-22 ENCOUNTER — Ambulatory Visit: Payer: Medicare Other | Admitting: Occupational Therapy

## 2018-06-22 DIAGNOSIS — M79602 Pain in left arm: Secondary | ICD-10-CM

## 2018-06-22 DIAGNOSIS — M7712 Lateral epicondylitis, left elbow: Secondary | ICD-10-CM

## 2018-06-22 DIAGNOSIS — M542 Cervicalgia: Secondary | ICD-10-CM | POA: Diagnosis not present

## 2018-06-22 DIAGNOSIS — G5622 Lesion of ulnar nerve, left upper limb: Secondary | ICD-10-CM

## 2018-06-22 DIAGNOSIS — M6281 Muscle weakness (generalized): Secondary | ICD-10-CM | POA: Diagnosis not present

## 2018-06-22 NOTE — Therapy (Signed)
[facility] [address] [place], [address] Phone: [phone]   Fax:  [phone]  Occupational Therapy Treatment  Patient Details  Name: Shelby Oliver MRN: [id number] Date of Birth: 1945-10-11 Referring Provider (OT): Copland   Encounter Date: 06/22/2018  OT End of Session - 06/22/18 2229    Visit Number  2    Number of Visits  10    Date for OT Re-Evaluation  07/31/18    OT Start Time  0801    OT Stop Time  0847    OT Time Calculation (min)  46 min    Activity Tolerance  Patient tolerated treatment well    Behavior During Therapy  [facility] for tasks assessed/performed       Past Medical History:  Diagnosis Date  . Chronic kidney disease    RENAL INSUFF  . Complication of anesthesia    RESPIRATORY PROBLEMS WITH SHOULDER SURGERY  . Dysrhythmia   . Edema    FEET/ANKLES  . Erosive esophagitis   . Gallbladder polyp   . GERD (gastroesophageal reflux disease)   . Heart murmur   . Hepatic hemangioma   . Hepatitis    A IN PAST  . History of hiatal hernia   . Hyperlipidemia   . Mild intermittent asthma   . MVP (mitral valve prolapse)    with slight regurgitation  . Orthopnea   . Osteoporosis   . Proteinuria   . Pulmonary nodule 2000's   stable over years--no more testing  . Sleep disturbance    chronic  . Thyroid nodule     Past Surgical History:  Procedure Laterality Date  . APPENDECTOMY    . CARPAL TUNNEL RELEASE Bilateral 1987  . CATARACT EXTRACTION W/PHACO Left 08/17/2017   Procedure: CATARACT EXTRACTION PHACO AND INTRAOCULAR LENS PLACEMENT (IOC);  Surgeon: Eulogio Bear, MD;  Location: [facility] ORS;  Service: Ophthalmology;  Laterality: Left;  [place] 00:29.0 AP% 11.1 CDE 3.21 Fluid Pack lot # [id number]  . CATARACT EXTRACTION W/PHACO Right 09/14/2017   Procedure: CATARACT EXTRACTION PHACO AND INTRAOCULAR LENS PLACEMENT (IOC);  Surgeon: Eulogio Bear, MD;  Location: [facility] ORS;  Service:  Ophthalmology;  Laterality: Right;  [place] 00:21.0 AP% 4.7 CDE 1.00 Fluid Pack Lot # [id number]  . SHOULDER ADHESION RELEASE Bilateral 1988, 2001  . TONSILLECTOMY      There were no vitals filed for this visit.  Subjective Assessment - 06/22/18 0808    Subjective   My arm had been sore since last -  I did get my splint that fits better on my wrist -and I sleep in recliner - I have been doing that for while     Patient Stated Goals  I want the pain better so I can use my arm again like before -     Currently in Pain?  Yes    Pain Score  6     Pain Location  Arm    Pain Orientation  Left    Pain Descriptors / Indicators  Aching;Throbbing;Tightness;Pins and needles    Pain Type  Acute pain    Pain Onset  More than a month ago          Pt arrive with counter force splint on - but need to check donning again next time   had her own wrist splint - that fits better- she is very petite Tender over lateral epicondyle and then upper traps on L  Report also forearm tender  and hurting           OT Treatments/Exercises (OP) - 06/22/18 0001      Moist Heat Therapy   Number Minutes Moist Heat  8 Minutes    Moist Heat Location  Elbow;Wrist;Hand      Ultrasound   Ultrasound Location  L upper traps     Ultrasound Parameters  3.3MHZ, 20 % and 0.8 intensity     Ultrasound Goals  Pain      Iontophoresis   Type of Iontophoresis  Dexamethasone    Location  L lateral epicondyle     Dose  medium patch; 2.0 current     Time  19       done some soft tissue manual on forearm - and graston sweeping tool nr 2 on upper arm and upper traps  And then joint mobs to elbow - lateral epicondyle - did not had increase pain  prior to ionto and  Then ultrasound done while ionto was done       OT Education - 06/22/18 1516    Education Details  findings aND HEP     Person(s) Educated  Patient    Methods  Explanation;Demonstration;Handout    Comprehension  Verbalized understanding;Returned  demonstration       OT Short Term Goals - 06/19/18 1957      OT SHORT TERM GOAL #1   Title  Pain in L elbow degrees to less than 2/10 with AROM during ADL's     Baseline  pain at elbow 6-7/10     Time  3    Period  Weeks    Status  New    Target Date  07/10/18      OT SHORT TERM GOAL #2   Title  Pt report numbness in L arm to decrease to less than 3 x week  and negative Tinel     Baseline  Pt has numbness in L elbow to 4th and 5th digits multiple times day and Positive tinel and tender     Time  4    Period  Weeks    Status  New    Target Date  07/17/18      OT SHORT TERM GOAL #3   Title  Pain and tightness in L upper traps decrease to less than 1/10 with cervical AROM to prior level of function     Baseline  pain with lateral and rotaion to R , tender 6/10 and tightness /spasm     Time  3    Period  Weeks    Status  New    Target Date  07/10/18        OT Long Term Goals - 06/19/18 2002      OT LONG TERM GOAL #1   Title  L elbow pain and AROM improve to [facility] to perform ADL's with pain less than 2/10     Baseline  pain 6-7/0 , with any activities and AROM of L arm     Time  6    Period  Weeks    Status  New    Target Date  07/31/18      OT LONG TERM GOAL #2   Title  Grip strength on the L be same with elbow to side and extended arm with no increase symptoms at elbow     Baseline  R grip elbow to side 30 and extended arm 31 - and L 19 and extended arm 10 lbs  Time  6    Period  Weeks    Status  New    Target Date  07/31/18            Plan - 06/22/18 1516    Clinical Impression Statement  Pt present with continues symptoms of L lateral epicondylitis and cubital tunnel still tender - pt report she tried to to prop elbow up and keep it extended - pt do have spasm and tight upper traps - symptoms to 4thand 5th digits could be cause by that too - done this date ionto at elbow and [place] at upper traps     Occupational performance deficits (Please refer to evaluation  for details):  ADL's;IADL's;Rest and Sleep;Play;Leisure    Rehab Potential  Good    Current Impairments/barriers affecting progress:  Pt had in past bilateral shoulder surgeries and cervical issues - cyst removed and multiple trigger fingers     OT Frequency  2x / week    OT Duration  6 weeks    OT Treatment/Interventions  Self-care/ADL training;Cryotherapy;Moist Heat;Ultrasound;Iontophoresis;Therapeutic exercise;Patient/family education;Passive range of motion;Manual Therapy;Splinting    Plan  assess progress with HEP -and review again and modify as needed     Clinical Decision Making  Multiple treatment options, significant modification of task necessary    OT Home Exercise Plan  see pt instruction     Consulted and Agree with Plan of Care  Patient       Patient will benefit from skilled therapeutic intervention in order to improve the following deficits and impairments:  Pain, Decreased range of motion, Decreased strength, Impaired flexibility, Impaired UE functional use, Increased muscle spasms, Impaired sensation  Visit Diagnosis: Pain in left arm  Muscle weakness (generalized)  Lateral epicondylitis, left elbow  Cubital canal compression syndrome, left  Cervicalgia    Problem List Patient Active Problem List   Diagnosis Date Noted  . Acute non-recurrent pansinusitis 06/06/2017  . Dry eye syndrome 06/06/2017  . Preventative health care 02/18/2015  . Advance directive discussed with patient 02/18/2015  . Chronic back pain 02/18/2015  . Hyperlipidemia   . Mild intermittent asthma   . MVP (mitral valve prolapse)   . Thyroid nodule   . Proteinuria   . Osteoporosis   . Erosive esophagitis   . Sleep disturbance     Rosalyn Gess OTR/l,CLT 06/22/2018, 3:20 PM  [facility] [address], [place], [address] Phone: [phone]   Fax:  [phone]  Name: Emilie Carp MRN: [id number] Date of  Birth: 1946/03/02

## 2018-06-22 NOTE — Patient Instructions (Signed)
Same HEP  

## 2018-06-25 ENCOUNTER — Ambulatory Visit: Payer: Medicare Other | Admitting: Occupational Therapy

## 2018-06-25 DIAGNOSIS — M79602 Pain in left arm: Secondary | ICD-10-CM

## 2018-06-25 DIAGNOSIS — M6281 Muscle weakness (generalized): Secondary | ICD-10-CM

## 2018-06-25 DIAGNOSIS — G5622 Lesion of ulnar nerve, left upper limb: Secondary | ICD-10-CM

## 2018-06-25 DIAGNOSIS — M542 Cervicalgia: Secondary | ICD-10-CM

## 2018-06-25 DIAGNOSIS — M7712 Lateral epicondylitis, left elbow: Secondary | ICD-10-CM

## 2018-06-25 NOTE — Patient Instructions (Signed)
Same

## 2018-06-25 NOTE — Therapy (Signed)
[facility] [address], [place], [address] Phone: [phone]   Fax:  [phone]  Occupational Therapy Treatment  Patient Details  Name: Shelby Oliver MRN: [id number] Date of Birth: [birth year] Referring Provider (OT): Copland   Encounter Date: 06/25/2018  OT End of Session - 06/25/18 1207    Visit Number  3    Number of Visits  10    Date for OT Re-Evaluation  07/31/18    OT Start Time  1110    OT Stop Time  1200    OT Time Calculation (min)  50 min    Activity Tolerance  Patient tolerated treatment well    Behavior During Therapy  [facility] for tasks assessed/performed       Past Medical History:  Diagnosis Date  . Chronic kidney disease    RENAL INSUFF  . Complication of anesthesia    RESPIRATORY PROBLEMS WITH SHOULDER SURGERY  . Dysrhythmia   . Edema    FEET/ANKLES  . Erosive esophagitis   . Gallbladder polyp   . GERD (gastroesophageal reflux disease)   . Heart murmur   . Hepatic hemangioma   . Hepatitis    A IN PAST  . History of hiatal hernia   . Hyperlipidemia   . Mild intermittent asthma   . MVP (mitral valve prolapse)    with slight regurgitation  . Orthopnea   . Osteoporosis   . Proteinuria   . Pulmonary nodule 2000's   stable over years--no more testing  . Sleep disturbance    chronic  . Thyroid nodule     Past Surgical History:  Procedure Laterality Date  . APPENDECTOMY    . CARPAL TUNNEL RELEASE Bilateral 1987  . CATARACT EXTRACTION W/PHACO Left 08/17/2017   Procedure: CATARACT EXTRACTION PHACO AND INTRAOCULAR LENS PLACEMENT (IOC);  Surgeon: Eulogio Bear, MD;  Location: [facility] ORS;  Service: Ophthalmology;  Laterality: Left;  [place] 00:29.0 AP% 11.1 CDE 3.21 Fluid Pack lot # [id number]  . CATARACT EXTRACTION W/PHACO Right 09/14/2017   Procedure: CATARACT EXTRACTION PHACO AND INTRAOCULAR LENS PLACEMENT (IOC);  Surgeon: Eulogio Bear, MD;  Location: [facility] ORS;  Service:  Ophthalmology;  Laterality: Right;  [place] 00:21.0 AP% 4.7 CDE 1.00 Fluid Pack Lot # [id number]  . SHOULDER ADHESION RELEASE Bilateral 1988, 2001  . TONSILLECTOMY      There were no vitals filed for this visit.  Subjective Assessment - 06/25/18 1205    Subjective   My arm still sore and elbow - and at night time and in the am  - numbness or stiff feeling in my pinkie and ring finger better- and my shoulder and traps better-     Patient Stated Goals  I want the pain better so I can use my arm again like before -     Currently in Pain?  Yes    Pain Score  5     Pain Location  Arm    Pain Orientation  Left    Pain Descriptors / Indicators  Aching;Throbbing;Sore;Tightness;Pins and needles    Pain Type  Acute pain    Pain Onset  More than a month ago    Pain Frequency  Constant         Pt arrive with counter force splint on -   had her own wrist splint - that fits better- she is very petite Tender over lateral epicondyle and forearm - webspace   upper traps on L and shoulder  pt report feels better and numbness in 4th and 5th -but still tender and over cubital tunnel             OT Treatments/Exercises (OP) - 06/25/18 0001      Moist Heat Therapy   Number Minutes Moist Heat  8 Minutes    Moist Heat Location  Elbow;Wrist;Hand   and upper traps      Ultrasound   Ultrasound Location  l upper traps     Ultrasound Parameters  3.61mHZ 20%, 0.8 intensity    Ultrasound Goals  Pain      Iontophoresis   Type of Iontophoresis  Dexamethasone    Location  L lateral epicondyle     Dose  medium patch; 2.0 current     Time  19          done some soft tissue manual on forearm - and graston sweeping tool nr 2 on  Dorsal and volar forearm and palm - tight and tender  over dorsal and radial forearm -and upper arm _graston done on  upper traps during ionto  Pt to cont with AROM for wrist flexion down and sideways with open hand - 5 reps hold 5 sec  attempt loose fist - but increase pain    ultrasound done on upper traps on L while ionto was done      OT Education - 06/25/18 1207    Education Details  progress and expectations     Person(s) Educated  Patient    Methods  Explanation;Demonstration;Handout    Comprehension  Verbalized understanding;Returned demonstration       OT Short Term Goals - 06/19/18 1957      OT SHORT TERM GOAL #1   Title  Pain in L elbow degrees to less than 2/10 with AROM during ADL's     Baseline  pain at elbow 6-7/10     Time  3    Period  Weeks    Status  New    Target Date  07/10/18      OT SHORT TERM GOAL #2   Title  Pt report numbness in L arm to decrease to less than 3 x week  and negative Tinel     Baseline  Pt has numbness in L elbow to 4th and 5th digits multiple times day and Positive tinel and tender     Time  4    Period  Weeks    Status  New    Target Date  07/17/18      OT SHORT TERM GOAL #3   Title  Pain and tightness in L upper traps decrease to less than 1/10 with cervical AROM to prior level of function     Baseline  pain with lateral and rotaion to R , tender 6/10 and tightness /spasm     Time  3    Period  Weeks    Status  New    Target Date  07/10/18        OT Long Term Goals - 06/19/18 2002      OT LONG TERM GOAL #1   Title  L elbow pain and AROM improve to [facility] to perform ADL's with pain less than 2/10     Baseline  pain 6-7/0 , with any activities and AROM of L arm     Time  6    Period  Weeks    Status  New    Target Date  07/31/18  OT LONG TERM GOAL #2   Title  Grip strength on the L be same with elbow to side and extended arm with no increase symptoms at elbow     Baseline  R grip elbow to side 30 and extended arm 31 - and L 19 and extended arm 10 lbs     Time  6    Period  Weeks    Status  New    Target Date  07/31/18            Plan - 06/25/18 1208    Clinical Impression Statement  Pt cont to be tender and sore in forearm and elbow - tender over L lateral epicondyle and upper  traps - but report upper traps and shoulder feelis better and numbness in 4thand 5th  but did feel it with palpation to upper traps and cubital tunnel - cont with only AROM with elbow to side for HEP     Occupational performance deficits (Please refer to evaluation for details):  ADL's;IADL's;Rest and Sleep;Play;Leisure    Rehab Potential  Good    Current Impairments/barriers affecting progress:  Pt had in past bilateral shoulder surgeries and cervical issues - cyst removed and multiple trigger fingers     OT Frequency  2x / week    OT Duration  --   5 wks   OT Treatment/Interventions  Self-care/ADL training;Cryotherapy;Moist Heat;Ultrasound;Iontophoresis;Therapeutic exercise;Patient/family education;Passive range of motion;Manual Therapy;Splinting    Plan  assess progress with HEP -and review again and modify as needed     Clinical Decision Making  Multiple treatment options, significant modification of task necessary    OT Home Exercise Plan  see pt instruction     Consulted and Agree with Plan of Care  Patient       Patient will benefit from skilled therapeutic intervention in order to improve the following deficits and impairments:  Pain, Decreased range of motion, Decreased strength, Impaired flexibility, Impaired UE functional use, Increased muscle spasms, Impaired sensation  Visit Diagnosis: Pain in left arm  Muscle weakness (generalized)  Lateral epicondylitis, left elbow  Cubital canal compression syndrome, left  Cervicalgia    Problem List Patient Active Problem List   Diagnosis Date Noted  . Acute non-recurrent pansinusitis 06/06/2017  . Dry eye syndrome 06/06/2017  . Preventative health care 02/18/2015  . Advance directive discussed with patient 02/18/2015  . Chronic back pain 02/18/2015  . Hyperlipidemia   . Mild intermittent asthma   . MVP (mitral valve prolapse)   . Thyroid nodule   . Proteinuria   . Osteoporosis   . Erosive esophagitis   . Sleep  disturbance     Rosalyn Gess OTR/L,CLT 06/25/2018, 12:10 PM  [facility] [address], [place], [address] Phone: [phone]   Fax:  [phone]  Name: Shelby Oliver MRN: [id number] Date of Birth: [birth year]

## 2018-06-26 ENCOUNTER — Ambulatory Visit (INDEPENDENT_AMBULATORY_CARE_PROVIDER_SITE_OTHER): Payer: Medicare Other | Admitting: Internal Medicine

## 2018-06-26 ENCOUNTER — Encounter: Payer: Self-pay | Admitting: Internal Medicine

## 2018-06-26 VITALS — BP 110/72 | HR 64 | Ht <= 58 in | Wt 125.8 lb

## 2018-06-26 DIAGNOSIS — K219 Gastro-esophageal reflux disease without esophagitis: Secondary | ICD-10-CM

## 2018-06-26 DIAGNOSIS — K222 Esophageal obstruction: Secondary | ICD-10-CM

## 2018-06-26 NOTE — Progress Notes (Signed)
HISTORY OF PRESENT ILLNESS:  Shelby Oliver is a 73 y.o. female, who was initially evaluated April 16, 2018 darting GERD, intermittent solid food dysphasia, and extensive GI history elsewhere as reported by the patient.  See that dictation.  She subsequently underwent upper endoscopy May 11, 2018.  Patient was found to have a peptic stricture of the esophagus as well as active esophagitis and a small hiatal hernia.  The stricture was balloon dilated to a maximal diameter of 20 mm.  She was placed on omeprazole 40 mg daily and asked to follow-up at this time.  Patient has been compliant with medical therapy.  She is delighted with how she is feeling.  She has had no pyrosis or further problems with dysphasia.  She does describe occasional sour belch in the morning and pharyngeal gurgling.  No other complaints.  REVIEW OF SYSTEMS:  All non-GI ROS negative unless otherwise stated in the HPI   Past Medical History:  Diagnosis Date  . Chronic kidney disease    RENAL INSUFF  . Complication of anesthesia    RESPIRATORY PROBLEMS WITH SHOULDER SURGERY  . Dysrhythmia   . Edema    FEET/ANKLES  . Erosive esophagitis   . Gallbladder polyp   . GERD (gastroesophageal reflux disease)   . Heart murmur   . Hepatic hemangioma   . Hepatitis    A IN PAST  . History of hiatal hernia   . Hyperlipidemia   . Mild intermittent asthma   . MVP (mitral valve prolapse)    with slight regurgitation  . Orthopnea   . Osteoporosis   . Proteinuria   . Pulmonary nodule 2000's   stable over years--no more testing  . Sleep disturbance    chronic  . Thyroid nodule     Past Surgical History:  Procedure Laterality Date  . APPENDECTOMY    . CARPAL TUNNEL RELEASE Bilateral 1987  . CATARACT EXTRACTION W/PHACO Left 08/17/2017   Procedure: CATARACT EXTRACTION PHACO AND INTRAOCULAR LENS PLACEMENT (IOC);  Surgeon: Eulogio Bear, MD;  Location: [facility] ORS;  Service: Ophthalmology;  Laterality: Left;  [place]  00:29.0 AP% 11.1 CDE 3.21 Fluid Pack lot # [id number]  . CATARACT EXTRACTION W/PHACO Right 09/14/2017   Procedure: CATARACT EXTRACTION PHACO AND INTRAOCULAR LENS PLACEMENT (IOC);  Surgeon: Eulogio Bear, MD;  Location: [facility] ORS;  Service: Ophthalmology;  Laterality: Right;  [place] 00:21.0 AP% 4.7 CDE 1.00 Fluid Pack Lot # [id number]  . GANGLION CYST EXCISION Right   . SHOULDER SURGERY Bilateral 1988, 2001  . TONSILLECTOMY    . TRIGGER FINGER RELEASE Right     Social History Alinna Siple  reports that she has never smoked. She has never used smokeless tobacco. She reports that she does not drink alcohol or use drugs.  family history includes Arthritis in her father and mother; Heart disease in her sister; Hyperlipidemia in her mother; Hypertension in her mother and sister; Kidney cancer in her mother; Stroke in her father.  Allergies  Allergen Reactions  . Avelox [Moxifloxacin Hcl In Nacl] Hives  . Ciprocin-Fluocin-Procin [Fluocinolone]   . Penicillins Hives    Has patient had a PCN reaction causing immediate rash, facial/tongue/throat swelling, SOB or lightheadedness with hypotension: no Has patient had a PCN reaction causing severe rash involving mucus membranes or skin necrosis: no Has patient had a PCN reaction that required hospitalization: no Has patient had a PCN reaction occurring within the last 10 years: no If all of the above answers are "NO", then may  proceed with Cephalosporin use.   . Quinolones Hives       PHYSICAL EXAMINATION: Vital signs: BP 110/72   Pulse 64   Ht 4' 6.5" (1.384 m)   Wt 125 lb 12.8 oz (57.1 kg)   BMI 29.78 kg/m   Constitutional: generally well-appearing, no acute distress Psychiatric: alert and oriented x3, cooperative Abdomen: Not reexamined Neuro: No focal deficits.   ASSESSMENT:  1.  GERD complicated by esophagitis and peptic stricture.  Asymptomatic post dilation on PPI 2.  Colon cancer screening.  Reported negative colonoscopy  elsewhere 15 years ago has not been interested in repeat screening colonoscopy.  Other screening strategies include FIT or Cologuard testing, though a positive result would elicit the need for colonoscopy (which she is not interested in).  She is aware, however.   PLAN:  1.  Reflux precautions 2.  Continue omeprazole daily. 3.  Medication effects and side effects and risks discussed in great detail. 4.  Routine GI office follow-up 1 year.  Sooner if needed for breakthrough symptoms, recurrent dysphasia, or other issues. 50-minute spent face-to-face with the patient.  Greater than 50% the time used for counseling regarding her chronic complicated GERD and its management as well as monitoring

## 2018-06-26 NOTE — Patient Instructions (Signed)
Please follow up in one year 

## 2018-06-28 ENCOUNTER — Ambulatory Visit: Payer: Medicare Other | Admitting: Occupational Therapy

## 2018-06-28 DIAGNOSIS — M6281 Muscle weakness (generalized): Secondary | ICD-10-CM | POA: Diagnosis not present

## 2018-06-28 DIAGNOSIS — M7712 Lateral epicondylitis, left elbow: Secondary | ICD-10-CM

## 2018-06-28 DIAGNOSIS — G5622 Lesion of ulnar nerve, left upper limb: Secondary | ICD-10-CM | POA: Diagnosis not present

## 2018-06-28 DIAGNOSIS — M542 Cervicalgia: Secondary | ICD-10-CM

## 2018-06-28 DIAGNOSIS — M79602 Pain in left arm: Secondary | ICD-10-CM | POA: Diagnosis not present

## 2018-06-28 NOTE — Patient Instructions (Signed)
Same but can add light stretch to elbow to side AROM stretch - 5 reps hold 3 sec

## 2018-06-28 NOTE — Therapy (Signed)
[facility] [address], [place], [address] Phone: [phone]   Fax:  [phone]  Occupational Therapy Treatment  Patient Details  Name: Shelby Oliver MRN: [id number] Date of Birth: [birth year] Referring Provider (OT): Copland   Encounter Date: 06/28/2018  OT End of Session - 06/28/18 1008    Visit Number  4    Number of Visits  10    Date for OT Re-Evaluation  07/31/18    OT Start Time  0930    OT Stop Time  1017    OT Time Calculation (min)  47 min    Activity Tolerance  Patient tolerated treatment well    Behavior During Therapy  [facility] for tasks assessed/performed       Past Medical History:  Diagnosis Date  . Chronic kidney disease    RENAL INSUFF  . Complication of anesthesia    RESPIRATORY PROBLEMS WITH SHOULDER SURGERY  . Dysrhythmia   . Edema    FEET/ANKLES  . Erosive esophagitis   . Gallbladder polyp   . GERD (gastroesophageal reflux disease)   . Heart murmur   . Hepatic hemangioma   . Hepatitis    A IN PAST  . History of hiatal hernia   . Hyperlipidemia   . Mild intermittent asthma   . MVP (mitral valve prolapse)    with slight regurgitation  . Orthopnea   . Osteoporosis   . Proteinuria   . Pulmonary nodule 2000's   stable over years--no more testing  . Sleep disturbance    chronic  . Thyroid nodule     Past Surgical History:  Procedure Laterality Date  . APPENDECTOMY    . CARPAL TUNNEL RELEASE Bilateral 1987  . CATARACT EXTRACTION W/PHACO Left 08/17/2017   Procedure: CATARACT EXTRACTION PHACO AND INTRAOCULAR LENS PLACEMENT (IOC);  Surgeon: Eulogio Bear, MD;  Location: [facility] ORS;  Service: Ophthalmology;  Laterality: Left;  [place] 00:29.0 AP% 11.1 CDE 3.21 Fluid Pack lot # [id number]  . CATARACT EXTRACTION W/PHACO Right 09/14/2017   Procedure: CATARACT EXTRACTION PHACO AND INTRAOCULAR LENS PLACEMENT (IOC);  Surgeon: Eulogio Bear, MD;  Location: [facility] ORS;  Service:  Ophthalmology;  Laterality: Right;  [place] 00:21.0 AP% 4.7 CDE 1.00 Fluid Pack Lot # [id number]  . GANGLION CYST EXCISION Right   . SHOULDER SURGERY Bilateral 1988, 2001  . TONSILLECTOMY    . TRIGGER FINGER RELEASE Right     There were no vitals filed for this visit.  Subjective Assessment - 06/28/18 1006    Subjective   My arms feels better - not as tight , feels looser - and the elbow feels better and the neck -my forearm still bothers me     Patient Stated Goals  I want the pain better so I can use my arm again like before -     Currently in Pain?  Yes    Pain Score  3     Pain Location  --   elbow to forearm to wrist    Pain Orientation  Left    Pain Descriptors / Indicators  Aching;Throbbing;Tightness    Pain Type  Acute pain    Pain Onset  More than a month ago    Pain Frequency  Constant                 Pt arrive with counter force splint on and her own wrist splint - that fits better- she is very petite Tender over  lateral epicondyle  still but less tightness and pain in elbow per pand forearm - webspace   upper traps on Lr  pt report feels better and numbness in 4th and 5th -but still tender and over cubital tunnel    OT Treatments/Exercises (OP) - 06/28/18 0001      Moist Heat Therapy   Number Minutes Moist Heat  8 Minutes    Moist Heat Location  Elbow;Wrist;Hand      Ultrasound   Ultrasound Location  L upper traps     Ultrasound Parameters  3.3MHZ 20 %, 0.8 intensity     Ultrasound Goals  Pain      Iontophoresis   Type of Iontophoresis  Dexamethasone    Location  L lateral epicondyle     Dose  medium patch; 2.0 current     Time  19         done some soft tissue manual on forearm -  graston sweeping tool nr 2 on  Dorsal and volar forearm and palm - tight and tender   over dorsal and radial forearm -and upper arm _graston done on  upper traps during ionto  Pt to cont with AROM for wrist flexion down and sideways with open hand - 5 reps hold 5 sec    but  add to gentle PROM open hand - 5 reps each above but hold 3 sec  attempt loose fist - but increase pain   ultrasound done on upper traps on L while ionto was done       OT Education - 06/28/18 1008    Education Details  progress and expectations - HEP to add light passive stretch     Person(s) Educated  Patient    Methods  Explanation;Demonstration;Handout    Comprehension  Verbalized understanding;Returned demonstration       OT Short Term Goals - 06/19/18 1957      OT SHORT TERM GOAL #1   Title  Pain in L elbow degrees to less than 2/10 with AROM during ADL's     Baseline  pain at elbow 6-7/10     Time  3    Period  Weeks    Status  New    Target Date  07/10/18      OT SHORT TERM GOAL #2   Title  Pt report numbness in L arm to decrease to less than 3 x week  and negative Tinel     Baseline  Pt has numbness in L elbow to 4th and 5th digits multiple times day and Positive tinel and tender     Time  4    Period  Weeks    Status  New    Target Date  07/17/18      OT SHORT TERM GOAL #3   Title  Pain and tightness in L upper traps decrease to less than 1/10 with cervical AROM to prior level of function     Baseline  pain with lateral and rotaion to R , tender 6/10 and tightness /spasm     Time  3    Period  Weeks    Status  New    Target Date  07/10/18        OT Long Term Goals - 06/19/18 2002      OT LONG TERM GOAL #1   Title  L elbow pain and AROM improve to [facility] to perform ADL's with pain less than 2/10     Baseline  pain 6-7/0 ,  with any activities and AROM of L arm     Time  6    Period  Weeks    Status  New    Target Date  07/31/18      OT LONG TERM GOAL #2   Title  Grip strength on the L be same with elbow to side and extended arm with no increase symptoms at elbow     Baseline  R grip elbow to side 30 and extended arm 31 - and L 19 and extended arm 10 lbs     Time  6    Period  Weeks    Status  New    Target Date  07/31/18            Plan -  06/28/18 1009    Clinical Impression Statement  Pt report that her arm feels better , not as tight and less pain in elbow this date- and upper trap not as painfull - but still tenderness and pain over dorsal forearm - still tender over L lateeral epicondyle - cont with POC     Occupational performance deficits (Please refer to evaluation for details):  ADL's;IADL's;Rest and Sleep;Play;Leisure    Rehab Potential  Good    Current Impairments/barriers affecting progress:  Pt had in past bilateral shoulder surgeries and cervical issues - cyst removed and multiple trigger fingers     OT Frequency  2x / week    OT Duration  --   5 wks    OT Treatment/Interventions  Self-care/ADL training;Cryotherapy;Moist Heat;Ultrasound;Iontophoresis;Therapeutic exercise;Patient/family education;Passive range of motion;Manual Therapy;Splinting    Plan  assess progress with HEP -and review again and modify as needed     Clinical Decision Making  Multiple treatment options, significant modification of task necessary    OT Home Exercise Plan  see pt instruction     Consulted and Agree with Plan of Care  Patient       Patient will benefit from skilled therapeutic intervention in order to improve the following deficits and impairments:  Pain, Decreased range of motion, Decreased strength, Impaired flexibility, Impaired UE functional use, Increased muscle spasms, Impaired sensation  Visit Diagnosis: Pain in left arm  Muscle weakness (generalized)  Lateral epicondylitis, left elbow  Cubital canal compression syndrome, left  Cervicalgia    Problem List Patient Active Problem List   Diagnosis Date Noted  . Acute non-recurrent pansinusitis 06/06/2017  . Dry eye syndrome 06/06/2017  . Preventative health care 02/18/2015  . Advance directive discussed with patient 02/18/2015  . Chronic back pain 02/18/2015  . Hyperlipidemia   . Mild intermittent asthma   . MVP (mitral valve prolapse)   . Thyroid nodule   .  Proteinuria   . Osteoporosis   . Erosive esophagitis   . Sleep disturbance     Rosalyn Gess OTR/l,CLT 06/28/2018, 5:28 PM  [facility] [address] [place], [address] Phone: [phone]   Fax:  [phone]  Name: Shelby Oliver MRN: [id number] Date of Birth: 1945-09-14

## 2018-07-03 ENCOUNTER — Ambulatory Visit: Payer: Medicare Other | Admitting: Occupational Therapy

## 2018-07-03 DIAGNOSIS — G5622 Lesion of ulnar nerve, left upper limb: Secondary | ICD-10-CM | POA: Diagnosis not present

## 2018-07-03 DIAGNOSIS — M7712 Lateral epicondylitis, left elbow: Secondary | ICD-10-CM

## 2018-07-03 DIAGNOSIS — M542 Cervicalgia: Secondary | ICD-10-CM | POA: Diagnosis not present

## 2018-07-03 DIAGNOSIS — M6281 Muscle weakness (generalized): Secondary | ICD-10-CM

## 2018-07-03 DIAGNOSIS — M79602 Pain in left arm: Secondary | ICD-10-CM

## 2018-07-03 NOTE — Patient Instructions (Signed)
Switch wrist splint to soft neoprene splint  Add shoulder AAROM on wall for flexion - 10 reps  after some heat  And scapula squeezes x 10 2 -3 x day  And forearm stretches for extensors - can do elbow to side - and loose fist - 5 x hold 3 sec

## 2018-07-03 NOTE — Therapy (Signed)
[place] [facility] [address] [place], [address] Phone: [phone]   Fax:  [phone]  Occupational Therapy Treatment  Patient Details  Name: Shelby Oliver MRN: [id number] Date of Birth: Jun 06, 1945 Referring Provider (OT): Copland   Encounter Date: 07/03/2018  OT End of Session - 07/03/18 1021    Visit Number  5    Number of Visits  10    Date for OT Re-Evaluation  07/31/18    OT Start Time  1009    OT Stop Time  1058    OT Time Calculation (min)  49 min    Activity Tolerance  Patient tolerated treatment well    Behavior During Therapy  [facility] for tasks assessed/performed       Past Medical History:  Diagnosis Date  . Chronic kidney disease    RENAL INSUFF  . Complication of anesthesia    RESPIRATORY PROBLEMS WITH SHOULDER SURGERY  . Dysrhythmia   . Edema    FEET/ANKLES  . Erosive esophagitis   . Gallbladder polyp   . GERD (gastroesophageal reflux disease)   . Heart murmur   . Hepatic hemangioma   . Hepatitis    A IN PAST  . History of hiatal hernia   . Hyperlipidemia   . Mild intermittent asthma   . MVP (mitral valve prolapse)    with slight regurgitation  . Orthopnea   . Osteoporosis   . Proteinuria   . Pulmonary nodule 2000's   stable over years--no more testing  . Sleep disturbance    chronic  . Thyroid nodule     Past Surgical History:  Procedure Laterality Date  . APPENDECTOMY    . CARPAL TUNNEL RELEASE Bilateral 1987  . CATARACT EXTRACTION W/PHACO Left 08/17/2017   Procedure: CATARACT EXTRACTION PHACO AND INTRAOCULAR LENS PLACEMENT (IOC);  Surgeon: Eulogio Bear, MD;  Location: [facility] ORS;  Service: Ophthalmology;  Laterality: Left;  [place] 00:29.0 AP% 11.1 CDE 3.21 Fluid Pack lot # [id number]  . CATARACT EXTRACTION W/PHACO Right 09/14/2017   Procedure: CATARACT EXTRACTION PHACO AND INTRAOCULAR LENS PLACEMENT (IOC);  Surgeon: Eulogio Bear, MD;  Location: [facility] ORS;  Service:  Ophthalmology;  Laterality: Right;  [place] 00:21.0 AP% 4.7 CDE 1.00 Fluid Pack Lot # [id number]  . GANGLION CYST EXCISION Right   . SHOULDER SURGERY Bilateral 1988, 2001  . TONSILLECTOMY    . TRIGGER FINGER RELEASE Right     There were no vitals filed for this visit.  Subjective Assessment - 07/03/18 1018    Subjective   In the am very stiff and sore my elbow - and then forearm still tender and ache - and under my arm - but it is getting better but cannot reach forward and pickl some thing up     Patient Stated Goals  I want the pain better so I can use my arm again like before -     Currently in Pain?  Yes    Pain Score  3     Pain Location  Arm    Pain Orientation  Left    Pain Descriptors / Indicators  Aching    Pain Type  Acute pain    Pain Onset  More than a month ago    Pain Frequency  Constant             Pt arrive with counter force splint on and her own wrist splint - that fits better- she is very petite but  cont to have tenderness over dorsal forearm- switch pt to neoprene wrist wrap to assess if will improve  Tender over lateral epicondyle still but less tightness and pain in elbow per pt forearm - webspace upper traps on Lr pt report feels better and  Had no numbness in 4th and 5th         OT Treatments/Exercises (OP) - 07/03/18 0001      Moist Heat Therapy   Number Minutes Moist Heat  8 Minutes    Moist Heat Location  Elbow;Wrist;Hand      Ultrasound   Ultrasound Location  L upper traps     Ultrasound Parameters  3.3MHZ, 20%, ; 0.8 intensity     Ultrasound Goals  Pain      Iontophoresis   Type of Iontophoresis  Dexamethasone    Location  L lateral epicondyle     Dose  medium patch; 2.0 current     Time  19        done some soft tissue manual on forearm -  graston sweeping tool nr 2 onDorsal and volar forearm and palm - tight and tender  over dorsal and radial forearm still  -and upper arm - at deltiod -graston done onupper traps during ionto   Pt to cont with AROM for wrist flexion down and sideways  But close hand this date and can do gentle PROM  - 5 reps each hold 3 sec   ultrasound done on upper traps on Lwhile ionto was done     Switch wrist splint to soft neoprene splint  Add shoulder AAROM on wall for flexion - 10 reps Felt pull under arm - lats   after some heat improve And scapula squeezes x 10 2 -3 x day  And forearm stretches for extensors - can do elbow to side - and loose fist - 5 x hold 3 sec    OT Education - 07/03/18 1021    Education Details  changes to HEP - add some scapula and shoulder HEP     Person(s) Educated  Patient    Methods  Explanation;Demonstration;Handout    Comprehension  Verbalized understanding;Returned demonstration       OT Short Term Goals - 06/19/18 1957      OT SHORT TERM GOAL #1   Title  Pain in L elbow degrees to less than 2/10 with AROM during ADL's     Baseline  pain at elbow 6-7/10     Time  3    Period  Weeks    Status  New    Target Date  07/10/18      OT SHORT TERM GOAL #2   Title  Pt report numbness in L arm to decrease to less than 3 x week  and negative Tinel     Baseline  Pt has numbness in L elbow to 4th and 5th digits multiple times day and Positive tinel and tender     Time  4    Period  Weeks    Status  New    Target Date  07/17/18      OT SHORT TERM GOAL #3   Title  Pain and tightness in L upper traps decrease to less than 1/10 with cervical AROM to prior level of function     Baseline  pain with lateral and rotaion to R , tender 6/10 and tightness /spasm     Time  3    Period  Weeks    Status  New    Target Date  07/10/18        OT Long Term Goals - 06/19/18 2002      OT LONG TERM GOAL #1   Title  L elbow pain and AROM improve to [facility] to perform ADL's with pain less than 2/10     Baseline  pain 6-7/0 , with any activities and AROM of L arm     Time  6    Period  Weeks    Status  New    Target Date  07/31/18      OT LONG TERM GOAL #2    Title  Grip strength on the L be same with elbow to side and extended arm with no increase symptoms at elbow     Baseline  R grip elbow to side 30 and extended arm 31 - and L 19 and extended arm 10 lbs     Time  6    Period  Weeks    Status  New    Target Date  07/31/18            Plan - 07/03/18 1021    Clinical Impression Statement  Pt report that her arm is feeling better- more achiness over forearm and elbow - night time worse -and then wiht reaching pt still elevate shoulder - show some tightness under arm for shoulder flexion - add AAROM on wall for shoulder flexion and scapula squeezes and switch hard wrist splint for soft one to see if help for soreness in dorsal forearm     Occupational performance deficits (Please refer to evaluation for details):  ADL's;IADL's;Rest and Sleep;Play;Leisure    Rehab Potential  Good    Current Impairments/barriers affecting progress:  Pt had in past bilateral shoulder surgeries and cervical issues - cyst removed and multiple trigger fingers     OT Frequency  2x / week    OT Duration  4 weeks    OT Treatment/Interventions  Self-care/ADL training;Cryotherapy;Moist Heat;Ultrasound;Iontophoresis;Therapeutic exercise;Patient/family education;Passive range of motion;Manual Therapy;Splinting    Plan  assess progress with HEP -and review again and modify as needed     Clinical Decision Making  Multiple treatment options, significant modification of task necessary    OT Home Exercise Plan  see pt instruction     Consulted and Agree with Plan of Care  Patient       Patient will benefit from skilled therapeutic intervention in order to improve the following deficits and impairments:  Pain, Decreased range of motion, Decreased strength, Impaired flexibility, Impaired UE functional use, Increased muscle spasms, Impaired sensation  Visit Diagnosis: Pain in left arm  Muscle weakness (generalized)  Lateral epicondylitis, left elbow  Cubital canal  compression syndrome, left  Cervicalgia    Problem List Patient Active Problem List   Diagnosis Date Noted  . Acute non-recurrent pansinusitis 06/06/2017  . Dry eye syndrome 06/06/2017  . Preventative health care 02/18/2015  . Advance directive discussed with patient 02/18/2015  . Chronic back pain 02/18/2015  . Hyperlipidemia   . Mild intermittent asthma   . MVP (mitral valve prolapse)   . Thyroid nodule   . Proteinuria   . Osteoporosis   . Erosive esophagitis   . Sleep disturbance     Rosalyn Gess OTR/l,CLT 07/03/2018, 12:49 PM  [place] [facility] [address] [place], [address] Phone: [phone]   Fax:  [phone]  Name: Shelby Oliver MRN: [id number] Date of Birth: 10-13-1945

## 2018-07-06 ENCOUNTER — Ambulatory Visit: Payer: Medicare Other | Admitting: Occupational Therapy

## 2018-07-06 DIAGNOSIS — M542 Cervicalgia: Secondary | ICD-10-CM | POA: Diagnosis not present

## 2018-07-06 DIAGNOSIS — M6281 Muscle weakness (generalized): Secondary | ICD-10-CM

## 2018-07-06 DIAGNOSIS — M79602 Pain in left arm: Secondary | ICD-10-CM

## 2018-07-06 DIAGNOSIS — M7712 Lateral epicondylitis, left elbow: Secondary | ICD-10-CM | POA: Diagnosis not present

## 2018-07-06 DIAGNOSIS — G5622 Lesion of ulnar nerve, left upper limb: Secondary | ICD-10-CM | POA: Diagnosis not present

## 2018-07-06 NOTE — Therapy (Signed)
[place] [facility] [address] [place], [address] Phone: [phone]   Fax:  [phone]  Occupational Therapy Treatment  Patient Details  Name: Shelby Oliver MRN: [id number] Date of Birth: 11/08/1945 Referring Provider (OT): Copland   Encounter Date: 07/06/2018  OT End of Session - 07/06/18 1418    Visit Number  6    Number of Visits  10    Date for OT Re-Evaluation  07/31/18    OT Start Time  0930    OT Stop Time  1018    OT Time Calculation (min)  48 min    Activity Tolerance  Patient tolerated treatment well    Behavior During Therapy  [facility] for tasks assessed/performed       Past Medical History:  Diagnosis Date  . Chronic kidney disease    RENAL INSUFF  . Complication of anesthesia    RESPIRATORY PROBLEMS WITH SHOULDER SURGERY  . Dysrhythmia   . Edema    FEET/ANKLES  . Erosive esophagitis   . Gallbladder polyp   . GERD (gastroesophageal reflux disease)   . Heart murmur   . Hepatic hemangioma   . Hepatitis    A IN PAST  . History of hiatal hernia   . Hyperlipidemia   . Mild intermittent asthma   . MVP (mitral valve prolapse)    with slight regurgitation  . Orthopnea   . Osteoporosis   . Proteinuria   . Pulmonary nodule 2000's   stable over years--no more testing  . Sleep disturbance    chronic  . Thyroid nodule     Past Surgical History:  Procedure Laterality Date  . APPENDECTOMY    . CARPAL TUNNEL RELEASE Bilateral 1987  . CATARACT EXTRACTION W/PHACO Left 08/17/2017   Procedure: CATARACT EXTRACTION PHACO AND INTRAOCULAR LENS PLACEMENT (IOC);  Surgeon: Eulogio Bear, MD;  Location: [facility];  Service: Ophthalmology;  Laterality: Left;  [place] 00:29.0 AP% 11.1 CDE 3.21 Fluid Pack lot # [id number]  . CATARACT EXTRACTION W/PHACO Right 09/14/2017   Procedure: CATARACT EXTRACTION PHACO AND INTRAOCULAR LENS PLACEMENT (IOC);  Surgeon: Eulogio Bear, MD;  Location: [facility] OR[facility];  Service:  Ophthalmology;  Laterality: Right;  [place] 00:21.0 AP% 4.7 CDE 1.00 Fluid Pack Lot # [id number]  . GANGLION CYST EXCISION Right   . SHOULDER SURGERY Bilateral 1988, 2001  . TONSILLECTOMY    . TRIGGER FINGER RELEASE Right     There were no vitals filed for this visit.  Subjective Assessment - 07/06/18 1414    Subjective   Doing better- I can tell it is getting better- my elbow felt better this am -had in past problems with my shoulder - did do the stretches on wall - and then my wrist I have old  injury - still feel some pain - but it is getting better    Patient Stated Goals  I want the pain better so I can use my arm again like before -     Currently in Pain?  Yes    Pain Score  2     Pain Location  Arm    Pain Orientation  Left    Pain Descriptors / Indicators  Aching;Tender    Pain Type  Acute pain    Pain Onset  More than a month ago    Aggravating Factors   increase with use of L hand and arm          [facility] OT Assessment -  07/06/18 0001      Strength   Right Hand Grip (lbs)  34   extended arm 31   Left Hand Grip (lbs)  19   19 extended arm      grip improve in L hand with extended arm - but proximal weakness - affecting distal function at elbow and wrist-   still tender over upper traps but improving Did add last time shoulder flexion AAROM on wall - and pt was doing those and add this date scapula protraction and retraction in supine - 10 reps   Tenderness over lat epicondyle improving  Still tender over forearm - but pt hold wrist injury per pt  And elbow was less sore this am since last time           OT Treatments/Exercises (OP) - 07/06/18 0001      Moist Heat Therapy   Number Minutes Moist Heat  8 Minutes    Moist Heat Location  Elbow;Wrist;Hand      Ultrasound   Ultrasound Location  L upper traps    Ultrasound Parameters  3.3MHZ, 20 % , 0.8 intensity     Ultrasound Goals  Pain      Iontophoresis   Type of Iontophoresis  Dexamethasone    Location   L lateral epicondyle     Dose  medium patch; 2.0 current     Time  19       done some soft tissue manual on forearm - graston sweeping tool nr 2 onDorsal and volar forearm and palm - tight and tender over dorsal and radial forearm still  -and upper arm - at deltiod -graston done onupper traps during ionto  Pt to cont with AROM for wrist flexion down and sideways  But close hand this date and can do gentle PROM  - 5 reps each hold 3 sec  US done in upper traps during ionto        OT Education - 07/06/18 1418    Education Details  changes to HEP - add some scapula and shoulder HEP     Person(s) Educated  Patient    Methods  Explanation;Demonstration;Handout    Comprehension  Verbalized understanding;Returned demonstration       OT Short Term Goals - 06/19/18 1957      OT SHORT TERM GOAL #1   Title  Pain in L elbow degrees to less than 2/10 with AROM during ADL's     Baseline  pain at elbow 6-7/10     Time  3    Period  Weeks    Status  New    Target Date  07/10/18      OT SHORT TERM GOAL #2   Title  Pt report numbness in L arm to decrease to less than 3 x week  and negative Tinel     Baseline  Pt has numbness in L elbow to 4th and 5th digits multiple times day and Positive tinel and tender     Time  4    Period  Weeks    Status  New    Target Date  07/17/18      OT SHORT TERM GOAL #3   Title  Pain and tightness in L upper traps decrease to less than 1/10 with cervical AROM to prior level of function     Baseline  pain with lateral and rotaion to R , tender 6/10 and tightness /spasm     Time  3  Period  Weeks    Status  New    Target Date  07/10/18        OT Long Term Goals - 06/19/18 2002      OT LONG TERM GOAL #1   Title  L elbow pain and AROM improve to [facility] to perform ADL's with pain less than 2/10     Baseline  pain 6-7/0 , with any activities and AROM of L arm     Time  6    Period  Weeks    Status  New    Target Date  07/31/18      OT LONG  TERM GOAL #2   Title  Grip strength on the L be same with elbow to side and extended arm with no increase symptoms at elbow     Baseline  R grip elbow to side 30 and extended arm 31 - and L 19 and extended arm 10 lbs     Time  6    Period  Weeks    Status  New    Target Date  07/31/18            Plan - 07/06/18 1418    Clinical Impression Statement  Pt report pain and achiness in L arm , elbow improving - still reaching with elevation of upper traps - add some shoulder stretches last time and this date some serratus to HEP -tenderness over lateral epicondyle improving this date - still tight over forearm extensors - can do elbow to side -but not extended arm because of proximal strength - grip did increase in L hand     Occupational performance deficits (Please refer to evaluation for details):  ADL's;IADL's;Rest and Sleep;Play;Leisure    Rehab Potential  Good    Current Impairments/barriers affecting progress:  Pt had in past bilateral shoulder surgeries and cervical issues - cyst removed and multiple trigger fingers     OT Frequency  2x / week    OT Duration  4 weeks    OT Treatment/Interventions  Self-care/ADL training;Cryotherapy;Moist Heat;Ultrasound;Iontophoresis;Therapeutic exercise;Patient/family education;Passive range of motion;Manual Therapy;Splinting    Plan  assess progress with HEP -and review again and modify as needed     Clinical Decision Making  Multiple treatment options, significant modification of task necessary    OT Home Exercise Plan  see pt instruction     Consulted and Agree with Plan of Care  Patient       Patient will benefit from skilled therapeutic intervention in order to improve the following deficits and impairments:  Pain, Decreased range of motion, Decreased strength, Impaired flexibility, Impaired UE functional use, Increased muscle spasms, Impaired sensation  Visit Diagnosis: Pain in left arm  Muscle weakness (generalized)  Lateral  epicondylitis, left elbow  Cubital canal compression syndrome, left  Cervicalgia    Problem List Patient Active Problem List   Diagnosis Date Noted  . Acute non-recurrent pansinusitis 06/06/2017  . Dry eye syndrome 06/06/2017  . Preventative health care 02/18/2015  . Advance directive discussed with patient 02/18/2015  . Chronic back pain 02/18/2015  . Hyperlipidemia   . Mild intermittent asthma   . MVP (mitral valve prolapse)   . Thyroid nodule   . Proteinuria   . Osteoporosis   . Erosive esophagitis   . Sleep disturbance     Rosalyn Gess OTR/l,CLT 07/06/2018, 2:23 PM  [place] [facility] [address] [place], [address] Phone: [phone]   Fax:  [phone]  Name: Shelby Oliver MRN: [id number] Date of Birth: 12-30-1945

## 2018-07-06 NOTE — Patient Instructions (Signed)
In prone to do protraction and retraction - 10 reps  pain free after wall slides for shoulder flexion

## 2018-07-07 NOTE — Progress Notes (Signed)
Dr. Frederico Hamman T. Kofi Murrell, MD, [facility] [place], [address] Phone: [phone] Fax: [phone]  07/09/2018  Patient: Shelby Oliver, MRN: [id number], DOB: Aug 23, 1945, 73 y.o.  Primary Physician:  Venia Carbon, MD   Chief Complaint  Patient presents with  . Follow-up    Left Elbow   Subjective:   Shelby Oliver is a 73 y.o. very pleasant female patient who presents with the following:  I saw the patient 6 weeks ago, and at that time she was having some fairly significant lateral epicondylitis on the left side as well as medial epicondylitis to a lesser degree.  I gave her some Voltaren gel to use, and also had her do some physical therapy with some manual techniques along with Decadron iontophoresis and phonophoresis.  She is here today for recheck.  Scapholunate partial tear, tfcc tear, L wrist. - activated some  She is feeling a lot better  Past Medical History, Surgical History, Social History, Family History, Problem List, Medications, and Allergies have been reviewed and updated if relevant.  Patient Active Problem List   Diagnosis Date Noted  . Acute non-recurrent pansinusitis 06/06/2017  . Dry eye syndrome 06/06/2017  . Preventative health care 02/18/2015  . Advance directive discussed with patient 02/18/2015  . Chronic back pain 02/18/2015  . Hyperlipidemia   . Mild intermittent asthma   . MVP (mitral valve prolapse)   . Thyroid nodule   . Proteinuria   . Osteoporosis   . Erosive esophagitis   . Sleep disturbance     Past Medical History:  Diagnosis Date  . Chronic kidney disease    RENAL INSUFF  . Complication of anesthesia    RESPIRATORY PROBLEMS WITH SHOULDER SURGERY  . Dysrhythmia   . Edema    FEET/ANKLES  . Erosive esophagitis   . Gallbladder polyp   . GERD (gastroesophageal reflux disease)   . Heart murmur   . Hepatic hemangioma   . Hepatitis    A IN PAST  . History of  hiatal hernia   . Hyperlipidemia   . Mild intermittent asthma   . MVP (mitral valve prolapse)    with slight regurgitation  . Orthopnea   . Osteoporosis   . Proteinuria   . Pulmonary nodule 2000's   stable over years--no more testing  . Sleep disturbance    chronic  . Thyroid nodule     Past Surgical History:  Procedure Laterality Date  . APPENDECTOMY    . CARPAL TUNNEL RELEASE Bilateral 1987  . CATARACT EXTRACTION W/PHACO Left 08/17/2017   Procedure: CATARACT EXTRACTION PHACO AND INTRAOCULAR LENS PLACEMENT (IOC);  Surgeon: Eulogio Bear, MD;  Location: [facility] ORS;  Service: Ophthalmology;  Laterality: Left;  [place] 00:29.0 AP% 11.1 CDE 3.21 Fluid Pack lot # [id number]  . CATARACT EXTRACTION W/PHACO Right 09/14/2017   Procedure: CATARACT EXTRACTION PHACO AND INTRAOCULAR LENS PLACEMENT (IOC);  Surgeon: Eulogio Bear, MD;  Location: [facility] ORS;  Service: Ophthalmology;  Laterality: Right;  [place] 00:21.0 AP% 4.7 CDE 1.00 Fluid Pack Lot # [id number]  . GANGLION CYST EXCISION Right   . SHOULDER SURGERY Bilateral 1988, 2001  . TONSILLECTOMY    . TRIGGER FINGER RELEASE Right     Social History   Socioeconomic History  . Marital status: Single    Spouse name: Not on file  . Number of children: 0  . Years of education: Not on file  . Highest education level: Not on  file  Occupational History  . 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 Financial resource strain: Not on file  . Food insecurity:    Worry: Not on file    Inability: Not on file  . Transportation needs:    Medical: Not on file    Non-medical: Not on file  Tobacco Use  . Smoking status: Never Smoker  . Smokeless tobacco: Never Used  Substance and Sexual Activity  . Alcohol use: No  . Drug use: No  . Sexual activity: Not on file  Lifestyle  . Physical activity:    Days per week: Not on file    Minutes per session: Not on file  . Stress: Not on file  Relationships    . Social connections:    Talks on phone: Not on file    Gets together: Not on file    Attends religious service: Not on file    Active member of club or organization: Not on file    Attends meetings of clubs or organizations: Not on file    Relationship status: Not on file  . Intimate partner violence:    Fear of current or ex partner: Not on file    Emotionally abused: Not on file    Physically abused: Not on file    Forced sexual activity: Not on file  Other Topics Concern  . Not on file  Social History Narrative   Wants to be called [name]   Has living will   [place] are her health care POA----Alessandro Kerrie [place],   Would accept resuscitation   No tube feeds if cognitively unaware    Family History  Problem Relation Age of Onset  . Hyperlipidemia Mother   . Arthritis Mother   . Hypertension Mother   . Kidney cancer Mother   . Stroke Father        hemorrhagic  . Arthritis Father   . Heart disease Sister        mitral valve repair  . Hypertension Sister   . Diabetes Neg Hx   . Colon cancer Neg Hx     Allergies  Allergen Reactions  . Avelox [Moxifloxacin Hcl In Nacl] Hives  . Ciprocin-Fluocin-Procin [Fluocinolone]   . Penicillins Hives    Has patient had a PCN reaction causing immediate rash, facial/tongue/throat swelling, SOB or lightheadedness with hypotension: no Has patient had a PCN reaction causing severe rash involving mucus membranes or skin necrosis: no Has patient had a PCN reaction that required hospitalization: no Has patient had a PCN reaction occurring within the last 10 years: no If all of the above answers are "NO", then may proceed with Cephalosporin use.   . Quinolones Hives    Medication list reviewed and updated in full in [place].  GEN: No fevers, chills. Nontoxic. Primarily MSK c/o today. MSK: Detailed in the HPI GI: tolerating PO intake without difficulty Neuro: No numbness, parasthesias, or tingling  associated. Otherwise the pertinent positives of the ROS are noted above.   Objective:   BP 100/68   Pulse 77   Temp 98.7 F (37.1 C) (Oral)   Ht 4' 6.75" (1.391 m)   Wt 126 lb 8 oz (57.4 kg)   BMI 29.67 kg/m    GEN: WDWN, NAD, Non-toxic, Alert & Oriented x 3 HEENT: Atraumatic, Normocephalic.  Ears and Nose: No external deformity. EXTR: No clubbing/cyanosis/edema NEURO: Normal gait.  PSYCH: Normally interactive. Conversant. Not  depressed or anxious appearing.  Calm demeanor.   Elbow: L Ecchymosis or edema: neg ROM: full flexion, extension, pronation, supination Shoulder ROM: Full Flexion: 5/5 Extension: 5/5, PAINFUL - better Supination: 5/5, PAINFUL - better Pronation: 5/5 Wrist ext: 5/5 Wrist flexion: 5/5 No gross bony abnormality Varus and Valgus stress: stable ECRB tenderness: YES, TTP - much better Medial epicondyle: NT Lateral epicondyle, resisted wrist extension from wrist full pronation and flexion: PAINFUL grip: 5/5  sensation intact Tinel's, Elbow: negative   Wrist swelling, true wrist, mildly ttp  Radiology: No results found.  Assessment and Plan:   Left lateral epicondylitis  Medial epicondylitis of left elbow  Doing much better, cont with hep and rehab  Ice, rest wrist - long term tfcc and partial scapholunate tear - if gets worse, I can always do a wrist injection  Follow-up: No follow-ups on file.   Signed,  Maud Deed. Supriya Beaston, MD   Outpatient Encounter Medications as of 07/09/2018  Medication Sig  . acetaminophen (TYLENOL) 500 MG tablet Take 1,000 mg by mouth every 8 (eight) hours as needed.  Marland Kitchen albuterol (PROVENTIL HFA;VENTOLIN HFA) 108 (90 Base) MCG/ACT inhaler Inhale 2 puffs into the lungs every 6 (six) hours as needed for wheezing or shortness of breath.  Marland Kitchen atorvastatin (LIPITOR) 10 MG tablet TAKE 1 TABLET(10 MG) BY MOUTH DAILY  . calcium carbonate (TUMS - DOSED IN MG ELEMENTAL CALCIUM) 500 MG chewable tablet Chew 1 tablet by mouth  as needed for indigestion or heartburn.  . cholecalciferol (VITAMIN D) 1000 units tablet Take 5,000 Units by mouth once a week. Sundays  . diclofenac sodium (VOLTAREN) 1 % GEL Apply 2 g topically 4 (four) times daily.  . metoprolol succinate (TOPROL-XL) 25 MG 24 hr tablet TAKE 1 TABLET(25 MG) BY MOUTH DAILY  . montelukast (SINGULAIR) 10 MG tablet TAKE 1 TABLET(10 MG) BY MOUTH DAILY  . olopatadine (PATANOL) 0.1 % ophthalmic solution Place 1 drop into both eyes 2 (two) times daily as needed.  Marland Kitchen omeprazole (PRILOSEC) 40 MG capsule Take 1 capsule (40 mg total) by mouth daily.  Vladimir Faster Glycol-Propyl Glycol (SYSTANE OP) Apply 1 drop to eye as needed.   No facility-administered encounter medications on file as of 07/09/2018.

## 2018-07-09 ENCOUNTER — Encounter: Payer: Self-pay | Admitting: Family Medicine

## 2018-07-09 ENCOUNTER — Ambulatory Visit (INDEPENDENT_AMBULATORY_CARE_PROVIDER_SITE_OTHER): Payer: Medicare Other | Admitting: Family Medicine

## 2018-07-09 VITALS — BP 100/68 | HR 77 | Temp 98.7°F | Ht <= 58 in | Wt 126.5 lb

## 2018-07-09 DIAGNOSIS — M7712 Lateral epicondylitis, left elbow: Secondary | ICD-10-CM

## 2018-07-09 DIAGNOSIS — M7702 Medial epicondylitis, left elbow: Secondary | ICD-10-CM | POA: Diagnosis not present

## 2018-07-10 ENCOUNTER — Ambulatory Visit: Payer: Medicare Other | Attending: Family Medicine | Admitting: Occupational Therapy

## 2018-07-10 DIAGNOSIS — G5622 Lesion of ulnar nerve, left upper limb: Secondary | ICD-10-CM | POA: Insufficient documentation

## 2018-07-10 DIAGNOSIS — M7712 Lateral epicondylitis, left elbow: Secondary | ICD-10-CM

## 2018-07-10 DIAGNOSIS — M6281 Muscle weakness (generalized): Secondary | ICD-10-CM | POA: Insufficient documentation

## 2018-07-10 DIAGNOSIS — M79602 Pain in left arm: Secondary | ICD-10-CM | POA: Diagnosis not present

## 2018-07-10 DIAGNOSIS — M542 Cervicalgia: Secondary | ICD-10-CM | POA: Insufficient documentation

## 2018-07-10 NOTE — Therapy (Signed)
[facility] [address], [place], [address] Phone: [phone]   Fax:  [phone]  Occupational Therapy Treatment  Patient Details  Name: Shelby Oliver MRN: [id number] Date of Birth: 03/25/1946 Referring Provider (OT): Copland   Encounter Date: 07/10/2018  OT End of Session - 07/10/18 0933    Visit Number  7    Number of Visits  10    Date for OT Re-Evaluation  07/31/18    OT Start Time  0922    OT Stop Time  1014    OT Time Calculation (min)  52 min    Activity Tolerance  Patient tolerated treatment well    Behavior During Therapy  [facility] for tasks assessed/performed       Past Medical History:  Diagnosis Date  . Chronic kidney disease    RENAL INSUFF  . Complication of anesthesia    RESPIRATORY PROBLEMS WITH SHOULDER SURGERY  . Dysrhythmia   . Edema    FEET/ANKLES  . Erosive esophagitis   . Gallbladder polyp   . GERD (gastroesophageal reflux disease)   . Heart murmur   . Hepatic hemangioma   . Hepatitis    A IN PAST  . History of hiatal hernia   . Hyperlipidemia   . Mild intermittent asthma   . MVP (mitral valve prolapse)    with slight regurgitation  . Orthopnea   . Osteoporosis   . Proteinuria   . Pulmonary nodule 2000's   stable over years--no more testing  . Sleep disturbance    chronic  . Thyroid nodule     Past Surgical History:  Procedure Laterality Date  . APPENDECTOMY    . CARPAL TUNNEL RELEASE Bilateral 1987  . CATARACT EXTRACTION W/PHACO Left 08/17/2017   Procedure: CATARACT EXTRACTION PHACO AND INTRAOCULAR LENS PLACEMENT (IOC);  Surgeon: Eulogio Bear, MD;  Location: [facility] ORS;  Service: Ophthalmology;  Laterality: Left;  [place] 00:29.0 AP% 11.1 CDE 3.21 Fluid Pack lot # [id number]  . CATARACT EXTRACTION W/PHACO Right 09/14/2017   Procedure: CATARACT EXTRACTION PHACO AND INTRAOCULAR LENS PLACEMENT (IOC);  Surgeon: Eulogio Bear, MD;  Location: [facility] ORS;  Service:  Ophthalmology;  Laterality: Right;  [place] 00:21.0 AP% 4.7 CDE 1.00 Fluid Pack Lot # [id number]  . GANGLION CYST EXCISION Right   . SHOULDER SURGERY Bilateral 1988, 2001  . TONSILLECTOMY    . TRIGGER FINGER RELEASE Right     There were no vitals filed for this visit.  Subjective Assessment - 07/10/18 0931    Subjective   Seen Dr Lorelei Pont yesterday - and he said he can give me shot for my wrist where I have the tears - but my elbow since yesterday more stiff and tight - pain about 3.5/10 - did do the exercises     Patient Stated Goals  I want the pain better so I can use my arm again like before -     Currently in Pain?  Yes    Pain Score  3     Pain Location  Arm    Pain Orientation  Left    Pain Descriptors / Indicators  Aching;Tender    Pain Type  Acute pain    Pain Onset  More than a month ago    Pain Frequency  Constant       Cont to show proximal weakness and shoulder stiffness -affecting distal function at elbow and wrist-   still tender over upper traps but improving  Pt to cont with  shoulder flexion AAROM on wall - add last time added scapula protraction and retraction in supine - 10 reps   Tenderness over lat epicondyle improving but still 3/10  Still tender over forearm - but pt old wrist injury per pt  And elbow was more stiff the last 2 days per pt              OT Treatments/Exercises (OP) - 07/10/18 0001      Moist Heat Therapy   Number Minutes Moist Heat  8 Minutes    Moist Heat Location  Elbow;Wrist;Hand      Ultrasound   Ultrasound Location  L upper traps    Ultrasound Parameters  3.20mHZ, 20 %, 0.8 intensity     Ultrasound Goals  Pain      Iontophoresis   Type of Iontophoresis  Dexamethasone    Location  L lateral epicondyle     Dose  medium patch; 2.0 current     Time  19         done some soft tissue manual on forearm - graston sweeping tool nr 2 onDorsal and volar forearm and palm - tight and tender over dorsal and radial forearm  still-and upper arm - at deltiod -graston done onupper traps during ionto  Pt to cont with AROM for wrist flexion down and sidewaysBut close hand this date and can do gentle PROM- 5 reps each hold 3 sec  Done this date some joint mobs to elbow - and proximal radius and humerus - will assess if making difference to tightenss   US done in upper traps during [employer]      OT Education - 07/10/18 0932    Education Details  changes to HEP - add some scapula and shoulder HEP review again     Person(s) Educated  Patient    Methods  Explanation;Demonstration;Handout    Comprehension  Verbalized understanding;Returned demonstration       OT Short Term Goals - 06/19/18 1957      OT SHORT TERM GOAL #1   Title  Pain in L elbow degrees to less than 2/10 with AROM during ADL's     Baseline  pain at elbow 6-7/10     Time  3    Period  Weeks    Status  New    Target Date  07/10/18      OT SHORT TERM GOAL #2   Title  Pt report numbness in L arm to decrease to less than 3 x week  and negative Tinel     Baseline  Pt has numbness in L elbow to 4th and 5th digits multiple times day and Positive tinel and tender     Time  4    Period  Weeks    Status  New    Target Date  07/17/18      OT SHORT TERM GOAL #3   Title  Pain and tightness in L upper traps decrease to less than 1/10 with cervical AROM to prior level of function     Baseline  pain with lateral and rotaion to R , tender 6/10 and tightness /spasm     Time  3    Period  Weeks    Status  New    Target Date  07/10/18        OT Long Term Goals - 06/19/18 2002      OT LONG TERM GOAL #1   Title  L elbow  pain and AROM improve to [facility] to perform ADL's with pain less than 2/10     Baseline  pain 6-7/0 , with any activities and AROM of L arm     Time  6    Period  Weeks    Status  New    Target Date  07/31/18      OT LONG TERM GOAL #2   Title  Grip strength on the L be same with elbow to side and extended arm with no increase  symptoms at elbow     Baseline  R grip elbow to side 30 and extended arm 31 - and L 19 and extended arm 10 lbs     Time  6    Period  Weeks    Status  New    Target Date  07/31/18            Plan - 07/10/18 0934    Clinical Impression Statement  Pt report this date elbow feels more tight - and upper arm to shoulder - but less pain - wrist has some pain but that is old injury from 2 tears - did do some joint mobs to elbow again and pt to cont with shoulder  and scapula exercises     Occupational performance deficits (Please refer to evaluation for details):  ADL's;IADL's;Rest and Sleep;Play;Leisure    Rehab Potential  Good    Clinical Decision Making  Multiple treatment options, significant modification of task necessary    OT Frequency  2x / week    OT Duration  4 weeks    OT Treatment/Interventions  Self-care/ADL training;Cryotherapy;Moist Heat;Ultrasound;Iontophoresis;Therapeutic exercise;Patient/family education;Passive range of motion;Manual Therapy;Splinting    Plan  assess progress with HEP -and review again and modify as needed     OT Home Exercise Plan  see pt instruction     Consulted and Agree with Plan of Care  Patient       Patient will benefit from skilled therapeutic intervention in order to improve the following deficits and impairments:     Visit Diagnosis: Pain in left arm  Muscle weakness (generalized)  Lateral epicondylitis, left elbow  Cubital canal compression syndrome, left  Cervicalgia    Problem List Patient Active Problem List   Diagnosis Date Noted  . Acute non-recurrent pansinusitis 06/06/2017  . Dry eye syndrome 06/06/2017  . Preventative health care 02/18/2015  . Advance directive discussed with patient 02/18/2015  . Chronic back pain 02/18/2015  . Hyperlipidemia   . Mild intermittent asthma   . MVP (mitral valve prolapse)   . Thyroid nodule   . Proteinuria   . Osteoporosis   . Erosive esophagitis   . Sleep disturbance      Rosalyn Gess OTR/l,CLT 07/10/2018, 10:17 AM  [facility] [address], [place], [address] Phone: [phone]   Fax:  [phone]  Name: Shelby Oliver MRN: [id number] Date of Birth: 1945-12-11

## 2018-07-13 ENCOUNTER — Ambulatory Visit: Payer: Medicare Other | Admitting: Occupational Therapy

## 2018-07-13 DIAGNOSIS — M6281 Muscle weakness (generalized): Secondary | ICD-10-CM | POA: Diagnosis not present

## 2018-07-13 DIAGNOSIS — M79602 Pain in left arm: Secondary | ICD-10-CM | POA: Diagnosis not present

## 2018-07-13 DIAGNOSIS — M7712 Lateral epicondylitis, left elbow: Secondary | ICD-10-CM

## 2018-07-13 DIAGNOSIS — G5622 Lesion of ulnar nerve, left upper limb: Secondary | ICD-10-CM

## 2018-07-13 DIAGNOSIS — M542 Cervicalgia: Secondary | ICD-10-CM

## 2018-07-13 IMAGING — DX DG CHEST 2V
2 series · 2 of 2 positions shown · non-contrast
Comparison: None available

CLINICAL DATA: Cough, left chest pain

EXAM:
CHEST  2 VIEW

[chest pa]
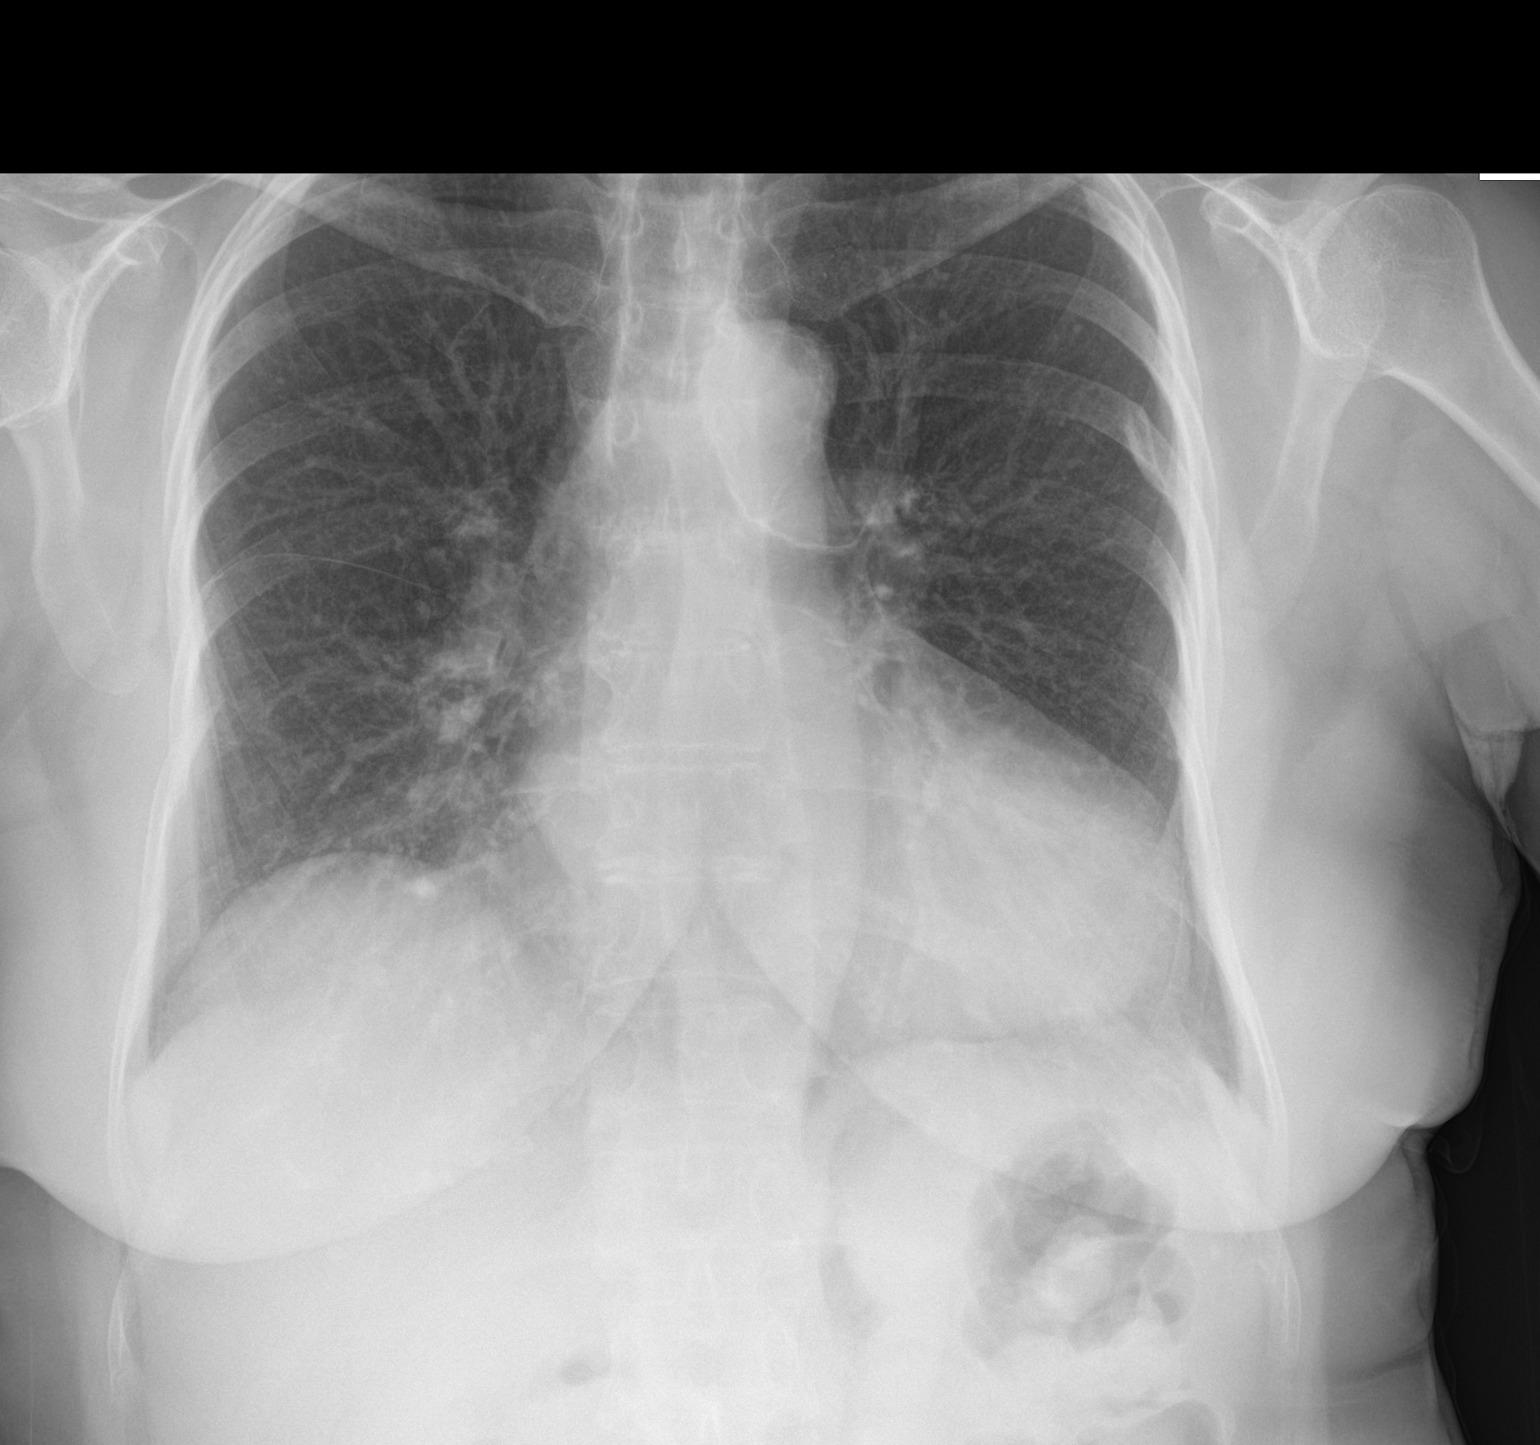

[chest lat]
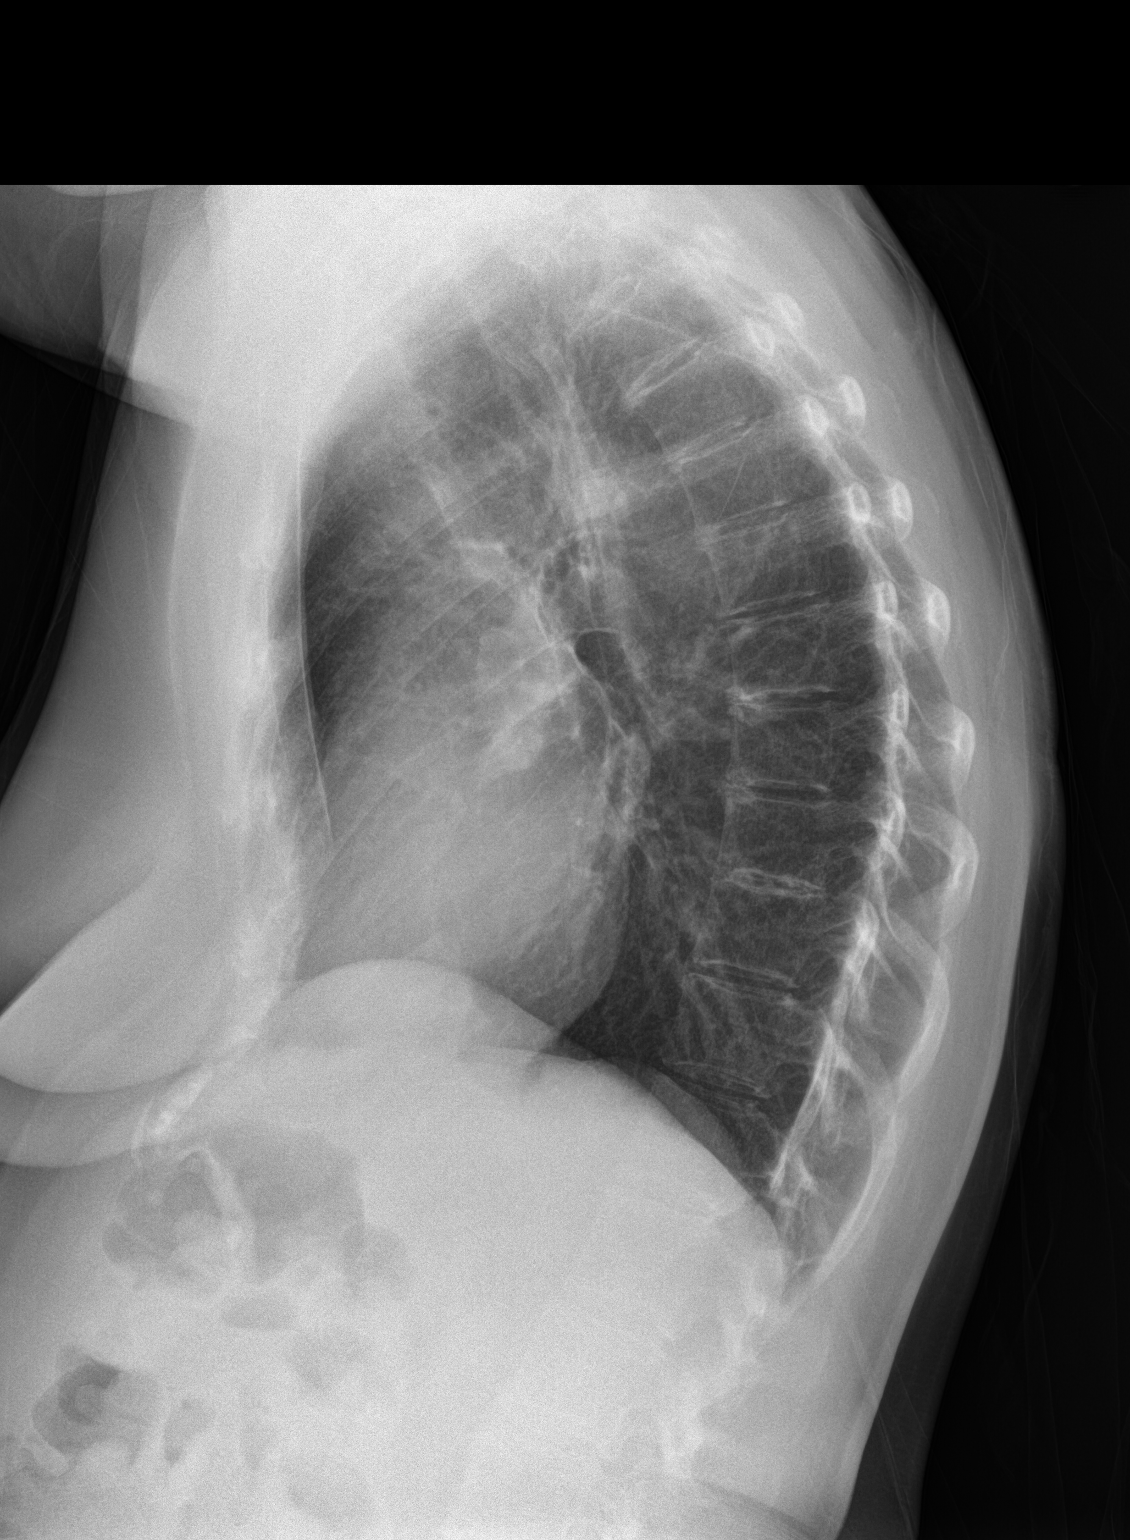

[2 of 2 positions shown; findings below may reference images not displayed]

FINDINGS: Mild cardiomegaly without edema, CHF, focal pneumonia, collapse or
consolidation. Lungs remain clear. No effusion or pneumothorax. Mild
pectus deformity suspected on the lateral view. Slight degenerative
changes of the spine. No acute osseous finding. Trachea is midline.
Atherosclerosis of aorta. Healed posterior left rib fractures.
IMPRESSION: Mild cardiomegaly without CHF or pneumonia.

Aortic atherosclerosis

No acute chest process.

## 2018-07-13 NOTE — Patient Instructions (Signed)
Pt to change extensor stretch to straight elbow - but rest arm on table when doing stretch  5 reps hold 5 sec  and wear counter force strap with use  Cont with heat and ice - massage and shoulder HEP

## 2018-07-13 NOTE — Therapy (Signed)
[facility] [address] [place], [address] Phone: [phone]   Fax:  [phone]  Occupational Therapy Treatment  Patient Details  Name: Shelby Oliver MRN: [id number] Date of Birth: July 27, 1945 Referring Provider (OT): Copland   Encounter Date: 07/13/2018  OT End of Session - 07/13/18 1343    Visit Number  8    Number of Visits  10    Date for OT Re-Evaluation  07/31/18    OT Start Time  0930    OT Stop Time  1020    OT Time Calculation (min)  50 min    Activity Tolerance  Patient tolerated treatment well    Behavior During Therapy  [facility] for tasks assessed/performed       Past Medical History:  Diagnosis Date  . Chronic kidney disease    RENAL INSUFF  . Complication of anesthesia    RESPIRATORY PROBLEMS WITH SHOULDER SURGERY  . Dysrhythmia   . Edema    FEET/ANKLES  . Erosive esophagitis   . Gallbladder polyp   . GERD (gastroesophageal reflux disease)   . Heart murmur   . Hepatic hemangioma   . Hepatitis    A IN PAST  . History of hiatal hernia   . Hyperlipidemia   . Mild intermittent asthma   . MVP (mitral valve prolapse)    with slight regurgitation  . Orthopnea   . Osteoporosis   . Proteinuria   . Pulmonary nodule 2000's   stable over years--no more testing  . Sleep disturbance    chronic  . Thyroid nodule     Past Surgical History:  Procedure Laterality Date  . APPENDECTOMY    . CARPAL TUNNEL RELEASE Bilateral 1987  . CATARACT EXTRACTION W/PHACO Left 08/17/2017   Procedure: CATARACT EXTRACTION PHACO AND INTRAOCULAR LENS PLACEMENT (IOC);  Surgeon: Eulogio Bear, MD;  Location: [facility] ORS;  Service: Ophthalmology;  Laterality: Left;  [place] 00:29.0 AP% 11.1 CDE 3.21 Fluid Pack lot # [id number]  . CATARACT EXTRACTION W/PHACO Right 09/14/2017   Procedure: CATARACT EXTRACTION PHACO AND INTRAOCULAR LENS PLACEMENT (IOC);  Surgeon: Eulogio Bear, MD;  Location: [facility] ORS;  Service:  Ophthalmology;  Laterality: Right;  [place] 00:21.0 AP% 4.7 CDE 1.00 Fluid Pack Lot # [id number]  . GANGLION CYST EXCISION Right   . SHOULDER SURGERY Bilateral 1988, 2001  . TONSILLECTOMY    . TRIGGER FINGER RELEASE Right     There were no vitals filed for this visit.  Subjective Assessment - 07/13/18 1340    Subjective   MY wrist and forearm just feel tight - like band and tender - but the rest feels okay     Patient Stated Goals  I want the pain better so I can use my arm again like before -     Currently in Pain?  Yes    Pain Score  4     Pain Location  Elbow    Pain Orientation  Left    Pain Descriptors / Indicators  Aching;Tender    Pain Type  Acute pain    Pain Onset  More than a month ago    Aggravating Factors   when reaching or lifting        Pt tender at lateral epicondyle and proximal forearm over extensors            skin check done prior and afterwards - tolerate  Well - not issues - to keep patch on for hour  OT Treatments/Exercises (OP) - 07/13/18 0001      Moist Heat Therapy   Number Minutes Moist Heat  8 Minutes    Moist Heat Location  Elbow;Wrist;Hand      Ultrasound   Ultrasound Location  L upper traps     Ultrasound Parameters  3.3MHZ, 20 %, 0.8 intensity     Ultrasound Goals  Pain      Iontophoresis   Type of Iontophoresis  Dexamethasone    Location  L lateral epicondyle     Dose  medium patch; 2.0 current     Time  19        after heat held off on graston tool - and done more joint mobs and gentle traction to elbow -tolerated well  Pt report she stopped wearing her counter force splint - pt to use it with act  Change her stretch to extended arm - for extensor stretch - but can support - 5 reps hold 5 sec     Pt to change extensor stretch to straight elbow - but rest arm on table when doing stretch  5 reps hold 5 sec  and wear counter force strap with use  Cont with heat and ice - massage and shoulder HEP   US done and graston on  upper traps on L while on ionto   OT Education - 07/13/18 1343    Education Details  change her stretch and to wear still her counter force strap     Person(s) Educated  Patient    Methods  Explanation;Demonstration;Handout    Comprehension  Verbalized understanding;Returned demonstration       OT Short Term Goals - 06/19/18 1957      OT SHORT TERM GOAL #1   Title  Pain in L elbow degrees to less than 2/10 with AROM during ADL's     Baseline  pain at elbow 6-7/10     Time  3    Period  Weeks    Status  New    Target Date  07/10/18      OT SHORT TERM GOAL #2   Title  Pt report numbness in L arm to decrease to less than 3 x week  and negative Tinel     Baseline  Pt has numbness in L elbow to 4th and 5th digits multiple times day and Positive tinel and tender     Time  4    Period  Weeks    Status  New    Target Date  07/17/18      OT SHORT TERM GOAL #3   Title  Pain and tightness in L upper traps decrease to less than 1/10 with cervical AROM to prior level of function     Baseline  pain with lateral and rotaion to R , tender 6/10 and tightness /spasm     Time  3    Period  Weeks    Status  New    Target Date  07/10/18        OT Long Term Goals - 06/19/18 2002      OT LONG TERM GOAL #1   Title  L elbow pain and AROM improve to [facility] to perform ADL's with pain less than 2/10     Baseline  pain 6-7/0 , with any activities and AROM of L arm     Time  6    Period  Weeks    Status  New    Target Date  07/31/18  OT LONG TERM GOAL #2   Title  Grip strength on the L be same with elbow to side and extended arm with no increase symptoms at elbow     Baseline  R grip elbow to side 30 and extended arm 31 - and L 19 and extended arm 10 lbs     Time  6    Period  Weeks    Status  New    Target Date  07/31/18            Plan - 07/13/18 1344    Clinical Impression Statement  Pt report this date cont to feel tight and tender over elbow when reaching or lifting -  shoulder better- change her stretched to extended arm but hold off this date on graston and done some joint mobs to elbow - pt to cont counter force strap with act - pt stopped using it     Occupational performance deficits (Please refer to evaluation for details):  ADL's;IADL's;Rest and Sleep;Play;Leisure    Rehab Potential  Good    Clinical Decision Making  Multiple treatment options, significant modification of task necessary    OT Frequency  1x / week    OT Duration  4 weeks    OT Treatment/Interventions  Self-care/ADL training;Cryotherapy;Moist Heat;Ultrasound;Iontophoresis;Therapeutic exercise;Patient/family education;Passive range of motion;Manual Therapy;Splinting    Plan  assess progress with HEP -and review again and modify as needed     OT Home Exercise Plan  see pt instruction     Consulted and Agree with Plan of Care  Patient       Patient will benefit from skilled therapeutic intervention in order to improve the following deficits and impairments:     Visit Diagnosis: Pain in left arm  Muscle weakness (generalized)  Lateral epicondylitis, left elbow  Cubital canal compression syndrome, left  Cervicalgia    Problem List Patient Active Problem List   Diagnosis Date Noted  . Acute non-recurrent pansinusitis 06/06/2017  . Dry eye syndrome 06/06/2017  . Preventative health care 02/18/2015  . Advance directive discussed with patient 02/18/2015  . Chronic back pain 02/18/2015  . Hyperlipidemia   . Mild intermittent asthma   . MVP (mitral valve prolapse)   . Thyroid nodule   . Proteinuria   . Osteoporosis   . Erosive esophagitis   . Sleep disturbance     Rosalyn Gess OTR/l,CLT 07/13/2018, 1:46 PM  [facility] [address], [place], [address] Phone: [phone]   Fax:  [phone]  Name: Shelby Oliver MRN: [id number] Date of Birth: 10-15-1945

## 2018-07-20 ENCOUNTER — Other Ambulatory Visit: Payer: Self-pay

## 2018-07-20 ENCOUNTER — Ambulatory Visit: Payer: Medicare Other | Admitting: Occupational Therapy

## 2018-07-20 DIAGNOSIS — G5622 Lesion of ulnar nerve, left upper limb: Secondary | ICD-10-CM | POA: Diagnosis not present

## 2018-07-20 DIAGNOSIS — M7712 Lateral epicondylitis, left elbow: Secondary | ICD-10-CM | POA: Diagnosis not present

## 2018-07-20 DIAGNOSIS — M542 Cervicalgia: Secondary | ICD-10-CM | POA: Diagnosis not present

## 2018-07-20 DIAGNOSIS — M79602 Pain in left arm: Secondary | ICD-10-CM | POA: Diagnosis not present

## 2018-07-20 DIAGNOSIS — M6281 Muscle weakness (generalized): Secondary | ICD-10-CM

## 2018-07-20 NOTE — Therapy (Signed)
[facility] [address], [place], [address] Phone: [phone]   Fax:  [phone]  Occupational Therapy Treatment  Patient Details  Name: Shelby Oliver MRN: [id number] Date of Birth: 10-Apr-1946 Referring Provider (OT): Copland   Encounter Date: 07/20/2018  OT End of Session - 07/20/18 1120    Visit Number  9    Number of Visits  10    Date for OT Re-Evaluation  07/31/18    OT Start Time  0910    OT Stop Time  1003    OT Time Calculation (min)  53 min    Activity Tolerance  Patient tolerated treatment well    Behavior During Therapy  [facility] for tasks assessed/performed       Past Medical History:  Diagnosis Date  . Chronic kidney disease    RENAL INSUFF  . Complication of anesthesia    RESPIRATORY PROBLEMS WITH SHOULDER SURGERY  . Dysrhythmia   . Edema    FEET/ANKLES  . Erosive esophagitis   . Gallbladder polyp   . GERD (gastroesophageal reflux disease)   . Heart murmur   . Hepatic hemangioma   . Hepatitis    A IN PAST  . History of hiatal hernia   . Hyperlipidemia   . Mild intermittent asthma   . MVP (mitral valve prolapse)    with slight regurgitation  . Orthopnea   . Osteoporosis   . Proteinuria   . Pulmonary nodule 2000's   stable over years--no more testing  . Sleep disturbance    chronic  . Thyroid nodule     Past Surgical History:  Procedure Laterality Date  . APPENDECTOMY    . CARPAL TUNNEL RELEASE Bilateral 1987  . CATARACT EXTRACTION W/PHACO Left 08/17/2017   Procedure: CATARACT EXTRACTION PHACO AND INTRAOCULAR LENS PLACEMENT (IOC);  Surgeon: Eulogio Bear, MD;  Location: [facility] ORS;  Service: Ophthalmology;  Laterality: Left;  [place] 00:29.0 AP% 11.1 CDE 3.21 Fluid Pack lot # [id number]  . CATARACT EXTRACTION W/PHACO Right 09/14/2017   Procedure: CATARACT EXTRACTION PHACO AND INTRAOCULAR LENS PLACEMENT (IOC);  Surgeon: Eulogio Bear, MD;  Location: [facility] ORS;  Service:  Ophthalmology;  Laterality: Right;  [place] 00:21.0 AP% 4.7 CDE 1.00 Fluid Pack Lot # [id number]  . GANGLION CYST EXCISION Right   . SHOULDER SURGERY Bilateral 1988, 2001  . TONSILLECTOMY    . TRIGGER FINGER RELEASE Right     There were no vitals filed for this visit.  Subjective Assessment - 07/20/18 1117    Subjective   My arm feels better ,looser and able to use it more - shoulder feels better and elbow - just at night time and am stiff - my wrist is always going to bother me - because of the 2 liigament tears I have     Patient Stated Goals  I want the pain better so I can use my arm again like before -     Currently in Pain?  Yes    Pain Score  3     Pain Location  Arm    Pain Orientation  Left    Pain Descriptors / Indicators  Aching;Tender;Tightness    Pain Type  Chronic pain;Acute pain    Pain Onset  More than a month ago    Pain Frequency  Constant         [facility] OT Assessment - 07/20/18 0001      AROM   Left Shoulder Flexion  120 Degrees   slight pull under arm    Left Shoulder ABduction  110 Degrees   slight pull under arm      Strength   Right Hand Grip (lbs)  35   extended 30   Left Hand Grip (lbs)  20   extended 25     PRogressing in upper trap pain ,and stifffness, shoulder AROM iimproving and reaching more normal with out elevating upper traps  Grip increase and extended arm grip increase but still pain  Tender over lat epicondyle   And wrist pain -has ligament tears - old           OT Treatments/Exercises (OP) - 07/20/18 0001      Moist Heat Therapy   Number Minutes Moist Heat  8 Minutes    Moist Heat Location  Elbow;Wrist;Hand      Ultrasound   Ultrasound Location  L upper trap and lat deltiod     Ultrasound Parameters  3.3MHZ, 20 % , 0.8 intensity     Ultrasound Goals  Pain      Iontophoresis   Type of Iontophoresis  Dexamethasone    Location  L lateral epicondyle     Dose  medium patch; 2.0 current     Time  19      after heat done  graston on forearm and elbow - as well as deltoid -  joint mobs and gentle traction to elbow -tolerated well  Pt report she stopped wearing her counter force splint - pt to use it with act  Stretch to extended arm - for extensor stretch - but can support - 5 reps hold 5 sec    extensor stretch to straight elbow - but rest arm on table when doing stretch  5 reps hold 5 sec  and wear counter force strap with use  Cont with heat and ice - massage and shoulder HEP   US done and graston on upper traps on L while on ionto and on deltoid          OT Education - 07/20/18 1120    Education Details  progress and cont HEP     Person(s) Educated  Patient    Methods  Explanation;Demonstration;Handout    Comprehension  Verbalized understanding;Returned demonstration       OT Short Term Goals - 07/20/18 1122      OT SHORT TERM GOAL #1   Title  Pain in L elbow degrees to less than 2/10 with AROM during ADL's     Baseline  pain at elbow 6-7/10 at eval and 07/20/18  pain 3/10     Time  3    Period  Weeks    Status  On-going    Target Date  08/10/18      OT SHORT TERM GOAL #2   Title  Pt report numbness in L arm to decrease to less than 3 x week  and negative Tinel     Baseline  not daily numbness anymore     Status  Achieved      OT SHORT TERM GOAL #3   Title  Pain and tightness in L upper traps decrease to less than 1/10 with cervical AROM to prior level of function     Baseline  increase AROM in shoulder with less pain in upper traps     Status  Achieved        OT Long Term Goals - 07/20/18 1124      OT LONG  TERM GOAL #1   Title  L elbow pain and AROM improve to [facility] to perform ADL's with pain less than 2/10     Baseline  pain 6-7/0 , with any activities and AROM of L arm  - pain still about 3/10 and 5/10 with heavy lifting or repetition     Time  2    Period  Weeks    Status  On-going    Target Date  08/03/18      OT LONG TERM GOAL #2   Title  Grip strength on the L be  same with elbow to side and extended arm with no increase symptoms at elbow     Baseline  R grip elbow to side 30 and extended arm 31 - and L 19 and extended arm 10 lbs  - NOW( 3/13)  20lbs L , 25 extended arm but sitll pain     Time  3    Period  Weeks    Status  On-going    Target Date  08/10/18            Plan - 07/20/18 1121    Clinical Impression Statement  Pt report increase AROM and use of L arm - less stiffness and pain in shoulder , upper traps , elbow - still tender over lateral epicondyle and lat deltoid - grip improve in L hand and extended arm - as well as increase shoulder AROM     Occupational performance deficits (Please refer to evaluation for details):  ADL's;IADL's;Rest and Sleep;Play;Leisure    Rehab Potential  Good    Clinical Decision Making  Multiple treatment options, significant modification of task necessary    OT Frequency  1x / week    OT Duration  4 weeks    OT Treatment/Interventions  Self-care/ADL training;Cryotherapy;Moist Heat;Ultrasound;Iontophoresis;Therapeutic exercise;Patient/family education;Passive range of motion;Manual Therapy;Splinting    Plan  assess progress with HEP -and review again and modify as needed     OT Home Exercise Plan  see pt instruction     Consulted and Agree with Plan of Care  Patient       Patient will benefit from skilled therapeutic intervention in order to improve the following deficits and impairments:     Visit Diagnosis: Pain in left arm  Muscle weakness (generalized)  Lateral epicondylitis, left elbow  Cubital canal compression syndrome, left  Cervicalgia    Problem List Patient Active Problem List   Diagnosis Date Noted  . Acute non-recurrent pansinusitis 06/06/2017  . Dry eye syndrome 06/06/2017  . Preventative health care 02/18/2015  . Advance directive discussed with patient 02/18/2015  . Chronic back pain 02/18/2015  . Hyperlipidemia   . Mild intermittent asthma   . MVP (mitral valve  prolapse)   . Thyroid nodule   . Proteinuria   . Osteoporosis   . Erosive esophagitis   . Sleep disturbance     Rosalyn Gess OTR/l,CLT 07/20/2018, 11:27 AM  [facility] [address] [place], [address] Phone: [phone]   Fax:  [phone]  Name: Shelby Oliver MRN: [id number] Date of Birth: [birth year]

## 2018-07-20 NOTE — Patient Instructions (Signed)
Same

## 2018-07-27 ENCOUNTER — Ambulatory Visit: Payer: Medicare Other | Admitting: Occupational Therapy

## 2018-07-30 ENCOUNTER — Encounter: Payer: Medicare Other | Admitting: Occupational Therapy

## 2018-07-30 ENCOUNTER — Encounter: Payer: Self-pay | Admitting: Occupational Therapy

## 2018-07-30 NOTE — Therapy (Signed)
[facility] [address] [place], [address] Phone: [phone]   Fax:  [phone]  Patient Details  Name: Shelby Oliver MRN: [id number] Date of Birth: 1945-12-02 Referring Provider:  No ref. provider found  Encounter Date: 07/30/2018   Called pt - she report she did get message that clinic will be closed for 2 wks because of Cor[place] virus- she is getting better every day - not feeling that catching pain anymore - and less stiffness.  For her to cont with the stretches in the am for elbow, cervical  And massages after heat to elbow and ice massage As well as AAROM for  L shoulder on wall - and protraction, retraction in supine To contact me in 2-3 wks for update    Rosalyn Gess OTR/L,CLT 07/30/2018, 2:59 PM  [facility] [address] [place], [address] Phone: [phone]   Fax:  [phone]

## 2018-11-01 ENCOUNTER — Ambulatory Visit: Payer: Medicare Other | Admitting: Internal Medicine

## 2018-11-01 ENCOUNTER — Ambulatory Visit: Payer: Self-pay | Admitting: Internal Medicine

## 2018-11-01 NOTE — Telephone Encounter (Signed)
This sounds like meralgia paresthetica---a fairly benign condition As long as she doesn't have other symptoms, it is okay to wait till next week for the visit

## 2018-11-01 NOTE — Telephone Encounter (Signed)
Pt called in c/o a small area on her left outer thigh being numb for 2-3 months.   Over the last 3-4 wks this area has spread over her outer thigh and has a burning sensation with it now.  I warm transferred the pt to [facility] to be scheduled with Dr. Silvio Pate.  I sent my notes over. Reason for Disposition . [1] SEVERE pain (e.g., excruciating, unable to do any normal activities) AND [2] not improved after 2 hours of pain medicine  Answer Assessment - Initial Assessment Questions 1. ONSET: "When did the pain start?"      Left outer thigh numbness started end of March.   Over last 3 wks it's spread and gotten worse.   2. LOCATION: "Where is the pain located?"      Left outer thigh and it's spreading 3. PAIN: "How bad is the pain?"    (Scale 1-10; or mild, moderate, severe)   -  MILD (1-3): doesn't interfere with normal activities    -  MODERATE (4-7): interferes with normal activities (e.g., work or school) or awakens from sleep, limping    -  SEVERE (8-10): excruciating pain, unable to do any normal activities, unable to walk     It's a burning sensation.   It's not really a pain.  4. WORK OR EXERCISE: "Has there been any recent work or exercise that involved this part of the body?"      I've been squatting up and down lately.   I've been sitting in my recliner a lot too.   I've also gained a lot of weight.   5. CAUSE: "What do you think is causing the leg pain?"     I don't think this is coming from a spinal nerve.   6. OTHER SYMPTOMS: "Do you have any other symptoms?" (e.g., chest pain, back pain, breathing difficulty, swelling, rash, fever, numbness, weakness)     The numbness over my left outer thigh is getting worse. 7. PREGNANCY: "Is there any chance you are pregnant?" "When was your last menstrual period?"     *No Answer*  Protocols used: LEG PAIN-A-AH

## 2018-11-01 NOTE — Telephone Encounter (Signed)
Pt already has in office appt with Dr Silvio Pate; 11/05/18 at [facility].

## 2018-11-01 NOTE — Telephone Encounter (Signed)
I spoke to patient and she said she didn't think it's that serious and she doesn't think she can get transportation until next week.  Patient scheduled appointment on 11/05/18, if she can't get transportation for the appointment she said she'd call back to reschedule.  I let her know if it gets worse to let us know.

## 2018-11-05 ENCOUNTER — Ambulatory Visit (INDEPENDENT_AMBULATORY_CARE_PROVIDER_SITE_OTHER): Payer: Medicare Other | Admitting: Internal Medicine

## 2018-11-05 ENCOUNTER — Other Ambulatory Visit: Payer: Self-pay

## 2018-11-05 ENCOUNTER — Ambulatory Visit (INDEPENDENT_AMBULATORY_CARE_PROVIDER_SITE_OTHER)
Admission: RE | Admit: 2018-11-05 | Discharge: 2018-11-05 | Disposition: A | Discharge status: Home / Self Care | Payer: Medicare Other | Source: Ambulatory Visit | Attending: Internal Medicine | Admitting: Internal Medicine

## 2018-11-05 ENCOUNTER — Ambulatory Visit: Payer: Medicare Other | Admitting: Internal Medicine

## 2018-11-05 ENCOUNTER — Encounter: Payer: Self-pay | Admitting: Internal Medicine

## 2018-11-05 VITALS — BP 116/70 | HR 81 | Temp 98.0°F | Ht <= 58 in | Wt 131.0 lb

## 2018-11-05 DIAGNOSIS — R0609 Other forms of dyspnea: Secondary | ICD-10-CM | POA: Insufficient documentation

## 2018-11-05 DIAGNOSIS — G5712 Meralgia paresthetica, left lower limb: Secondary | ICD-10-CM | POA: Insufficient documentation

## 2018-11-05 DIAGNOSIS — I700 Atherosclerosis of aorta: Secondary | ICD-10-CM | POA: Insufficient documentation

## 2018-11-05 DIAGNOSIS — R635 Abnormal weight gain: Secondary | ICD-10-CM | POA: Diagnosis not present

## 2018-11-05 DIAGNOSIS — R0602 Shortness of breath: Secondary | ICD-10-CM | POA: Diagnosis not present

## 2018-11-05 NOTE — Assessment & Plan Note (Signed)
Not sciatica Symmetric but reduced leg strength ---discussed that this is unrelated Will set up with PT due to her reduced function (back pain, etc)

## 2018-11-05 NOTE — Assessment & Plan Note (Signed)
Noted on CXR Is on statin

## 2018-11-05 NOTE — Progress Notes (Signed)
Subjective:    Patient ID: Shelby Oliver, female    DOB: 07/07/1945, 73 y.o.   MRN: [id number]  HPI Here due to lateral left leg numbness  Started walking more with the shut down due to COVID Started getting pain along her left posterior pelvis--and low back Some tightness around hip Then had one spot on lateral leg upper thigh that was numb  Sleeping in recliner due to her back Legs tend to feel heavy when she reclines--"and I have no where to put them" Feel tight Now with slight right side sciatica In past 2 weeks, has had more trouble walking--due to leg heaviness. This has improved though Now with more prominent numbness along side of left thigh. Burning sensation at times Bothers her some when she is walking---comes and goes  Reviewed CXR from last year---aortic atherosclerosis and big heart (wtihout CHF)  Current Outpatient Medications on File Prior to Visit  Medication Sig Dispense Refill  . acetaminophen (TYLENOL) 500 MG tablet Take 1,000 mg by mouth every 8 (eight) hours as needed.    Marland Kitchen albuterol (PROVENTIL HFA;VENTOLIN HFA) 108 (90 Base) MCG/ACT inhaler Inhale 2 puffs into the lungs every 6 (six) hours as needed for wheezing or shortness of breath. 1 Inhaler 1  . atorvastatin (LIPITOR) 10 MG tablet TAKE 1 TABLET(10 MG) BY MOUTH DAILY 90 tablet 3  . calcium carbonate (TUMS - DOSED IN MG ELEMENTAL CALCIUM) 500 MG chewable tablet Chew 1 tablet by mouth as needed for indigestion or heartburn.    . cetirizine (ZYRTEC) 10 MG tablet Take 10 mg by mouth daily.    . cholecalciferol (VITAMIN D) 1000 units tablet Take 5,000 Units by mouth once a week. Sundays    . diclofenac sodium (VOLTAREN) 1 % GEL Apply 2 g topically 4 (four) times daily. 3 Tube 5  . metoprolol succinate (TOPROL-XL) 25 MG 24 hr tablet TAKE 1 TABLET(25 MG) BY MOUTH DAILY 90 tablet 3  . montelukast (SINGULAIR) 10 MG tablet TAKE 1 TABLET(10 MG) BY MOUTH DAILY 90 tablet 3  . olopatadine (PATANOL) 0.1 % ophthalmic  solution Place 1 drop into both eyes 2 (two) times daily as needed.    Marland Kitchen omeprazole (PRILOSEC) 40 MG capsule Take 1 capsule (40 mg total) by mouth daily. 30 capsule 11  . Polyethyl Glycol-Propyl Glycol (SYSTANE OP) Apply 1 drop to eye as needed.     No current facility-administered medications on file prior to visit.     Allergies  Allergen Reactions  . Avelox [Moxifloxacin Hcl In Nacl] Hives  . Ciprocin-Fluocin-Procin [Fluocinolone]   . Penicillins Hives    Has patient had a PCN reaction causing immediate rash, facial/tongue/throat swelling, SOB or lightheadedness with hypotension: no Has patient had a PCN reaction causing severe rash involving mucus membranes or skin necrosis: no Has patient had a PCN reaction that required hospitalization: no Has patient had a PCN reaction occurring within the last 10 years: no If all of the above answers are "NO", then may proceed with Cephalosporin use.   . Quinolones Hives    Past Medical History:  Diagnosis Date  . Chronic kidney disease    RENAL INSUFF  . Complication of anesthesia    RESPIRATORY PROBLEMS WITH SHOULDER SURGERY  . Dysrhythmia   . Edema    FEET/ANKLES  . Erosive esophagitis   . Gallbladder polyp   . GERD (gastroesophageal reflux disease)   . Heart murmur   . Hepatic hemangioma   . Hepatitis    A IN  PAST  . History of hiatal hernia   . Hyperlipidemia   . Mild intermittent asthma   . MVP (mitral valve prolapse)    with slight regurgitation  . Orthopnea   . Osteoporosis   . Proteinuria   . Pulmonary nodule 2000's   stable over years--no more testing  . Sleep disturbance    chronic  . Thyroid nodule     Past Surgical History:  Procedure Laterality Date  . APPENDECTOMY    . CARPAL TUNNEL RELEASE Bilateral 1987  . CATARACT EXTRACTION W/PHACO Left 08/17/2017   Procedure: CATARACT EXTRACTION PHACO AND INTRAOCULAR LENS PLACEMENT (IOC);  Surgeon: Eulogio Bear, MD;  Location: [facility];  Service: Ophthalmology;   Laterality: Left;  [place] 00:29.0 AP% 11.1 CDE 3.21 Fluid Pack lot # [id number]  . CATARACT EXTRACTION W/PHACO Right 09/14/2017   Procedure: CATARACT EXTRACTION PHACO AND INTRAOCULAR LENS PLACEMENT (IOC);  Surgeon: Eulogio Bear, MD;  Location: [facility];  Service: Ophthalmology;  Laterality: Right;  [place] 00:21.0 AP% 4.7 CDE 1.00 Fluid Pack Lot # [id number]  . GANGLION CYST EXCISION Right   . SHOULDER SURGERY Bilateral 1988, 2001  . TONSILLECTOMY    . TRIGGER FINGER RELEASE Right     Family History  Problem Relation Age of Onset  . Hyperlipidemia Mother   . Arthritis Mother   . Hypertension Mother   . Kidney cancer Mother   . Stroke Father        hemorrhagic  . Arthritis Father   . Heart disease Sister        mitral valve repair  . Hypertension Sister   . Diabetes Neg Hx   . Colon cancer Neg Hx     Social History   Socioeconomic History  . Marital status: Single    Spouse name: Not on file  . Number of children: 0  . Years of education: Not on file  . Highest education level: Not on file  Occupational History  . 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 Financial resource strain: Not on file  . Food insecurity    Worry: Not on file    Inability: Not on file  . Transportation needs    Medical: Not on file    Non-medical: Not on file  Tobacco Use  . Smoking status: Never Smoker  . Smokeless tobacco: Never Used  Substance and Sexual Activity  . Alcohol use: No  . Drug use: No  . Sexual activity: Not on file  Lifestyle  . Physical activity    Days per week: Not on file    Minutes per session: Not on file  . Stress: Not on file  Relationships  . Social Herbalist on phone: Not on file    Gets together: Not on file    Attends religious service: Not on file    Active member of club or organization: Not on file    Attends meetings of clubs or organizations: Not on file    Relationship status: Not on file  .  Intimate partner violence    Fear of current or ex partner: Not on file    Emotionally abused: Not on file    Physically abused: Not on file    Forced sexual activity: Not on file  Other Topics Concern  . Not on file  Social History Narrative   Wants to be called Shelby Oliver   Has living will   [place]  are her health care POA----Shelby Oliver,   Would accept resuscitation   No tube feeds if cognitively unaware   Review of Systems Still gaining weight--"I can't control it" Very distressed about this--very careful with what she eats More pain in thoracic spine since the weight gain Feels she gets easier DOE with her weight gain Some ankle edema    Objective:   Physical Exam  Constitutional: She appears well-developed. No distress.  Neck: No thyromegaly present.  Cardiovascular: Normal rate, regular rhythm and normal heart sounds. Exam reveals no gallop.  No murmur heard. Respiratory: Effort normal and breath sounds normal. No respiratory distress. She has no wheezes. She has no rales.  GI: Soft. There is no abdominal tenderness.  Musculoskeletal:     Comments: Normal ROM in hips Trace non pitting edema  Lymphadenopathy:    She has no cervical adenopathy.  Neurological:  Walks somewhat hunched---but not antalgic Hip flexors are mildly weak but symmetric           Assessment & Plan:

## 2018-11-05 NOTE — Assessment & Plan Note (Signed)
No explanation with how careful she is eating, etc Will check labs again

## 2018-11-05 NOTE — Assessment & Plan Note (Signed)
Will check BNP and CXR again No clinical CHF though Deconditioning?

## 2018-11-06 LAB — BRAIN NATRIURETIC PEPTIDE: Pro B Natriuretic peptide (BNP): 24 pg/mL (ref 0.0–100.0)

## 2018-11-06 LAB — COMPREHENSIVE METABOLIC PANEL
ALT: 16 U/L (ref 0–35)
AST: 17 U/L (ref 0–37)
Albumin: 4.6 g/dL (ref 3.5–5.2)
Alkaline Phosphatase: 60 U/L (ref 39–117)
BUN: 20 mg/dL (ref 6–23)
CO2: 29 mEq/L (ref 19–32)
Calcium: 9.8 mg/dL (ref 8.4–10.5)
Chloride: 100 mEq/L (ref 96–112)
Creatinine, Ser: 0.78 mg/dL (ref 0.40–1.20)
GFR: 72.3 mL/min (ref >60.00)
Glucose, Bld: 96 mg/dL (ref 70–99)
Potassium: 4.4 mEq/L (ref 3.5–5.1)
Sodium: 137 mEq/L (ref 135–145)
Total Bilirubin: 0.5 mg/dL (ref 0.2–1.2)
Total Protein: 7.6 g/dL (ref 6.0–8.3)

## 2018-11-06 LAB — T4, FREE: Free T4: 0.61 ng/dL (ref 0.60–1.60)

## 2018-11-06 LAB — CBC
HCT: 38.9 % (ref 36.0–46.0)
Hemoglobin: 13.4 g/dL (ref 12.0–15.0)
MCHC: 34.4 g/dL (ref 30.0–36.0)
MCV: 88 fl (ref 78.0–100.0)
Platelets: 292 10*3/uL (ref 150.0–400.0)
RBC: 4.42 Mil/uL (ref 3.87–5.11)
RDW: 13.1 % (ref 11.5–15.5)
WBC: 8.8 10*3/uL (ref 4.0–10.5)

## 2018-11-06 LAB — LIPID PANEL
Cholesterol: 196 mg/dL (ref 0–200)
HDL: 41.7 mg/dL (ref >39.00)
Total CHOL/HDL Ratio: 5
Triglycerides: 433 mg/dL — ABNORMAL HIGH (ref 0.0–149.0)

## 2018-11-06 LAB — LDL CHOLESTEROL, DIRECT: Direct LDL: 95 mg/dL

## 2018-11-16 DIAGNOSIS — R262 Difficulty in walking, not elsewhere classified: Secondary | ICD-10-CM | POA: Diagnosis not present

## 2018-11-16 DIAGNOSIS — M545 Low back pain: Secondary | ICD-10-CM | POA: Diagnosis not present

## 2018-11-19 DIAGNOSIS — R262 Difficulty in walking, not elsewhere classified: Secondary | ICD-10-CM | POA: Diagnosis not present

## 2018-11-19 DIAGNOSIS — M545 Low back pain: Secondary | ICD-10-CM | POA: Diagnosis not present

## 2018-11-21 DIAGNOSIS — R262 Difficulty in walking, not elsewhere classified: Secondary | ICD-10-CM | POA: Diagnosis not present

## 2018-11-21 DIAGNOSIS — M545 Low back pain: Secondary | ICD-10-CM | POA: Diagnosis not present

## 2018-11-23 DIAGNOSIS — M545 Low back pain: Secondary | ICD-10-CM | POA: Diagnosis not present

## 2018-11-23 DIAGNOSIS — R262 Difficulty in walking, not elsewhere classified: Secondary | ICD-10-CM | POA: Diagnosis not present

## 2018-11-26 DIAGNOSIS — M545 Low back pain: Secondary | ICD-10-CM | POA: Diagnosis not present

## 2018-11-26 DIAGNOSIS — R262 Difficulty in walking, not elsewhere classified: Secondary | ICD-10-CM | POA: Diagnosis not present

## 2018-11-28 DIAGNOSIS — M545 Low back pain: Secondary | ICD-10-CM | POA: Diagnosis not present

## 2018-11-28 DIAGNOSIS — R262 Difficulty in walking, not elsewhere classified: Secondary | ICD-10-CM | POA: Diagnosis not present

## 2018-11-30 DIAGNOSIS — R262 Difficulty in walking, not elsewhere classified: Secondary | ICD-10-CM | POA: Diagnosis not present

## 2018-11-30 DIAGNOSIS — M545 Low back pain: Secondary | ICD-10-CM | POA: Diagnosis not present

## 2018-12-05 DIAGNOSIS — M545 Low back pain: Secondary | ICD-10-CM | POA: Diagnosis not present

## 2018-12-05 DIAGNOSIS — R262 Difficulty in walking, not elsewhere classified: Secondary | ICD-10-CM | POA: Diagnosis not present

## 2018-12-07 DIAGNOSIS — M545 Low back pain: Secondary | ICD-10-CM | POA: Diagnosis not present

## 2018-12-07 DIAGNOSIS — R262 Difficulty in walking, not elsewhere classified: Secondary | ICD-10-CM | POA: Diagnosis not present

## 2018-12-11 DIAGNOSIS — M545 Low back pain: Secondary | ICD-10-CM | POA: Diagnosis not present

## 2018-12-11 DIAGNOSIS — R262 Difficulty in walking, not elsewhere classified: Secondary | ICD-10-CM | POA: Diagnosis not present

## 2018-12-12 DIAGNOSIS — M545 Low back pain: Secondary | ICD-10-CM | POA: Diagnosis not present

## 2018-12-12 DIAGNOSIS — R262 Difficulty in walking, not elsewhere classified: Secondary | ICD-10-CM | POA: Diagnosis not present

## 2018-12-14 DIAGNOSIS — M545 Low back pain: Secondary | ICD-10-CM | POA: Diagnosis not present

## 2018-12-14 DIAGNOSIS — R262 Difficulty in walking, not elsewhere classified: Secondary | ICD-10-CM | POA: Diagnosis not present

## 2018-12-17 DIAGNOSIS — R262 Difficulty in walking, not elsewhere classified: Secondary | ICD-10-CM | POA: Diagnosis not present

## 2018-12-17 DIAGNOSIS — M545 Low back pain: Secondary | ICD-10-CM | POA: Diagnosis not present

## 2018-12-19 DIAGNOSIS — R262 Difficulty in walking, not elsewhere classified: Secondary | ICD-10-CM | POA: Diagnosis not present

## 2018-12-19 DIAGNOSIS — M545 Low back pain: Secondary | ICD-10-CM | POA: Diagnosis not present

## 2018-12-21 DIAGNOSIS — R262 Difficulty in walking, not elsewhere classified: Secondary | ICD-10-CM | POA: Diagnosis not present

## 2018-12-21 DIAGNOSIS — M545 Low back pain: Secondary | ICD-10-CM | POA: Diagnosis not present

## 2018-12-24 DIAGNOSIS — R262 Difficulty in walking, not elsewhere classified: Secondary | ICD-10-CM | POA: Diagnosis not present

## 2018-12-24 DIAGNOSIS — M545 Low back pain: Secondary | ICD-10-CM | POA: Diagnosis not present

## 2018-12-26 DIAGNOSIS — M545 Low back pain: Secondary | ICD-10-CM | POA: Diagnosis not present

## 2018-12-26 DIAGNOSIS — R262 Difficulty in walking, not elsewhere classified: Secondary | ICD-10-CM | POA: Diagnosis not present

## 2019-01-24 DIAGNOSIS — H43811 Vitreous degeneration, right eye: Secondary | ICD-10-CM | POA: Diagnosis not present

## 2019-01-26 DIAGNOSIS — Z23 Encounter for immunization: Secondary | ICD-10-CM | POA: Diagnosis not present

## 2019-03-11 ENCOUNTER — Encounter: Payer: Self-pay | Admitting: Internal Medicine

## 2019-03-11 ENCOUNTER — Ambulatory Visit (INDEPENDENT_AMBULATORY_CARE_PROVIDER_SITE_OTHER): Payer: Medicare Other | Admitting: Internal Medicine

## 2019-03-11 ENCOUNTER — Other Ambulatory Visit: Payer: Self-pay

## 2019-03-11 VITALS — BP 126/82 | HR 81 | Temp 97.2°F | Ht <= 58 in | Wt 129.8 lb

## 2019-03-11 DIAGNOSIS — G8929 Other chronic pain: Secondary | ICD-10-CM | POA: Diagnosis not present

## 2019-03-11 DIAGNOSIS — K2210 Ulcer of esophagus without bleeding: Secondary | ICD-10-CM | POA: Diagnosis not present

## 2019-03-11 DIAGNOSIS — J4520 Mild intermittent asthma, uncomplicated: Secondary | ICD-10-CM

## 2019-03-11 DIAGNOSIS — Z0000 Encounter for general adult medical examination without abnormal findings: Secondary | ICD-10-CM | POA: Diagnosis not present

## 2019-03-11 DIAGNOSIS — G5712 Meralgia paresthetica, left lower limb: Secondary | ICD-10-CM

## 2019-03-11 DIAGNOSIS — M810 Age-related osteoporosis without current pathological fracture: Secondary | ICD-10-CM

## 2019-03-11 DIAGNOSIS — M546 Pain in thoracic spine: Secondary | ICD-10-CM | POA: Diagnosis not present

## 2019-03-11 DIAGNOSIS — Z1211 Encounter for screening for malignant neoplasm of colon: Secondary | ICD-10-CM | POA: Diagnosis not present

## 2019-03-11 DIAGNOSIS — I341 Nonrheumatic mitral (valve) prolapse: Secondary | ICD-10-CM

## 2019-03-11 DIAGNOSIS — Z7189 Other specified counseling: Secondary | ICD-10-CM | POA: Diagnosis not present

## 2019-03-11 MED ORDER — MONTELUKAST SODIUM 10 MG PO TABS: ORAL_TABLET | ORAL | 3 refills | Status: DC

## 2019-03-11 MED ORDER — METOPROLOL SUCCINATE ER 25 MG PO TB24: ORAL_TABLET | ORAL | 3 refills | Status: DC

## 2019-03-11 MED ORDER — ATORVASTATIN CALCIUM 10 MG PO TABS: ORAL_TABLET | ORAL | 3 refills | Status: DC

## 2019-03-11 NOTE — Assessment & Plan Note (Signed)
Has had over 5 years of actonel and other Rx Last DEXA showed improvement

## 2019-03-11 NOTE — Assessment & Plan Note (Signed)
Quiet on high dose PPI

## 2019-03-11 NOTE — Assessment & Plan Note (Signed)
See social history 

## 2019-03-11 NOTE — Assessment & Plan Note (Signed)
Ongoing sensory symptoms No apparent radiculopathy

## 2019-03-11 NOTE — Assessment & Plan Note (Signed)
Thoracic Some help with tylenol Resistance bands made it worse

## 2019-03-11 NOTE — Assessment & Plan Note (Signed)
Has not been symptomatic--but limited exercise

## 2019-03-11 NOTE — Assessment & Plan Note (Signed)
Symptoms controlled with beta blocker

## 2019-03-11 NOTE — Progress Notes (Signed)
Subjective:    Patient ID: Shelby Oliver, female    DOB: 01/09/1946, 73 y.o.   MRN: [id number]  HPI Here for Medicare wellness visit and follow up of chronic health conditions Reviewed form and advanced directives Reviewed other doctors No tobacco or alcohol Walks regularly--but not outside (and uses steps) Vision is fine--but does have light sensitivity and allergic issues Hearing is fine No falls No sig depression or anhedonia Independent with instrumental ADLs No sig memory issues  Has gone for PT at [facility] Didn't help much---always has been tight Still with left thigh numbness--but also some burning "I can live with that" Also has some thoracic pain---if leaning over ironing, or reaching for things Minor abnormalities on past MRI This did worsen doing resistance bands with therapy Still gets shock like pains there at times---but has been able to go back to sleeping in bed Uses tylenol --usually 1000 bid-----"takes the edge off" Doesn't use heat/cold, etc---can't reach the area  Still concerned about her weight Has stabilized but still very distressing  Allergies have been bad Continues on the regimen  Some "heaviness" in chest at times No palpitations Notices her "thorax" is squashed down---gets SOB only if bending over No sig DOE Some edema if she is up for a while---puffy along lateral malleoli Uses light compression socks No wheezing or chronic cough  Continues on statin No apparent problems  Esophagus is much better No dysphagia now On the high dose omeprazole--and doing well on this (has cut back to every other day)  Current Outpatient Medications on File Prior to Visit  Medication Sig Dispense Refill  . acetaminophen (TYLENOL) 500 MG tablet Take 1,000 mg by mouth every 8 (eight) hours as needed.    Marland Kitchen albuterol (PROVENTIL HFA;VENTOLIN HFA) 108 (90 Base) MCG/ACT inhaler Inhale 2 puffs into the lungs every 6 (six) hours as needed for wheezing or  shortness of breath. 1 Inhaler 1  . atorvastatin (LIPITOR) 10 MG tablet TAKE 1 TABLET(10 MG) BY MOUTH DAILY 90 tablet 3  . calcium carbonate (TUMS - DOSED IN MG ELEMENTAL CALCIUM) 500 MG chewable tablet Chew 1 tablet by mouth as needed for indigestion or heartburn.    . cetirizine (ZYRTEC) 10 MG tablet Take 10 mg by mouth daily.    . cholecalciferol (VITAMIN D) 1000 units tablet Take 5,000 Units by mouth once a week. Sundays    . diclofenac sodium (VOLTAREN) 1 % GEL Apply 2 g topically 4 (four) times daily. 3 Tube 5  . metoprolol succinate (TOPROL-XL) 25 MG 24 hr tablet TAKE 1 TABLET(25 MG) BY MOUTH DAILY 90 tablet 3  . montelukast (SINGULAIR) 10 MG tablet TAKE 1 TABLET(10 MG) BY MOUTH DAILY 90 tablet 3  . olopatadine (PATANOL) 0.1 % ophthalmic solution Place 1 drop into both eyes 2 (two) times daily as needed.    Marland Kitchen omeprazole (PRILOSEC) 40 MG capsule Take 1 capsule (40 mg total) by mouth daily. 30 capsule 11  . Polyethyl Glycol-Propyl Glycol (SYSTANE OP) Apply 1 drop to eye as needed.     No current facility-administered medications on file prior to visit.     Allergies  Allergen Reactions  . Avelox [Moxifloxacin Hcl In Nacl] Hives  . Ciprocin-Fluocin-Procin [Fluocinolone]   . Penicillins Hives    Has patient had a PCN reaction causing immediate rash, facial/tongue/throat swelling, SOB or lightheadedness with hypotension: no Has patient had a PCN reaction causing severe rash involving mucus membranes or skin necrosis: no Has patient had a PCN reaction  that required hospitalization: no Has patient had a PCN reaction occurring within the last 10 years: no If all of the above answers are "NO", then may proceed with Cephalosporin use.   . Quinolones Hives    Past Medical History:  Diagnosis Date  . Chronic kidney disease    RENAL INSUFF  . Complication of anesthesia    RESPIRATORY PROBLEMS WITH SHOULDER SURGERY  . Dysrhythmia   . Edema    FEET/ANKLES  . Erosive esophagitis   .  Gallbladder polyp   . GERD (gastroesophageal reflux disease)   . Heart murmur   . Hepatic hemangioma   . Hepatitis    A IN PAST  . History of hiatal hernia   . Hyperlipidemia   . Mild intermittent asthma   . MVP (mitral valve prolapse)    with slight regurgitation  . Orthopnea   . Osteoporosis   . Proteinuria   . Pulmonary nodule 2000's   stable over years--no more testing  . Sleep disturbance    chronic  . Thyroid nodule     Past Surgical History:  Procedure Laterality Date  . APPENDECTOMY    . CARPAL TUNNEL RELEASE Bilateral 1987  . CATARACT EXTRACTION W/PHACO Left 08/17/2017   Procedure: CATARACT EXTRACTION PHACO AND INTRAOCULAR LENS PLACEMENT (IOC);  Surgeon: Eulogio Bear, MD;  Location: [facility] OR[facility];  Service: Ophthalmology;  Laterality: Left;  [place] 00:29.0 AP% 11.1 CDE 3.21 Fluid Pack lot # [id number]  . CATARACT EXTRACTION W/PHACO Right 09/14/2017   Procedure: CATARACT EXTRACTION PHACO AND INTRAOCULAR LENS PLACEMENT (IOC);  Surgeon: Eulogio Bear, MD;  Location: [facility];  Service: Ophthalmology;  Laterality: Right;  [place] 00:21.0 AP% 4.7 CDE 1.00 Fluid Pack Lot # [id number]  . GANGLION CYST EXCISION Right   . SHOULDER SURGERY Bilateral 1988, 2001  . TONSILLECTOMY    . TRIGGER FINGER RELEASE Right     Family History  Problem Relation Age of Onset  . Hyperlipidemia Mother   . Arthritis Mother   . Hypertension Mother   . Kidney cancer Mother   . Stroke Father        hemorrhagic  . Arthritis Father   . Heart disease Sister        mitral valve repair  . Hypertension Sister   . Diabetes Neg Hx   . Colon cancer Neg Hx     Social History   Socioeconomic History  . Marital status: Single    Spouse name: Not on file  . Number of children: 0  . Years of education: Not on file  . Highest education level: Not on file  Occupational History  . 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 Financial resource strain: Not on  file  . Food insecurity    Worry: Not on file    Inability: Not on file  . Transportation needs    Medical: Not on file    Non-medical: Not on file  Tobacco Use  . Smoking status: Never Smoker  . Smokeless tobacco: Never Used  Substance and Sexual Activity  . Alcohol use: No  . Drug use: No  . Sexual activity: Not on file  Lifestyle  . Physical activity    Days per week: Not on file    Minutes per session: Not on file  . Stress: Not on file  Relationships  . Social Herbalist on phone: Not on file    Gets together: Not  on file    Attends religious service: Not on file    Active member of club or organization: Not on file    Attends meetings of clubs or organizations: Not on file    Relationship status: Not on file  . Intimate partner violence    Fear of current or ex partner: Not on file    Emotionally abused: Not on file    Physically abused: Not on file    Forced sexual activity: Not on file  Other Topics Concern  . Not on file  Social History Narrative   Wants to be called [name]   Has living will   [place] are her health care POA----Alessandro Kerrie [place],   Would accept resuscitation   No tube feeds if cognitively unaware   Review of Systems Mood is fine---she is comfortable with being on her own. Some stress with niece--she is now separated from her husband Sleep is still not good--but able to go back into the bed Wears seat belt Teeth okay--keeps up with dentist No current suspicious skin lesions. Gets some rash Bowels move fine. If hard--may get slight blood on toilet paper Voids okay in general--may have spells of leakage after. No other incontinence Some chronic knee and ankle pain    Objective:   Physical Exam  Constitutional: She is oriented to person, place, and time. She appears well-developed. No distress.  HENT:  Mouth/Throat: Oropharynx is clear and moist. No oropharyngeal exudate.  Neck: No thyromegaly present.   Cardiovascular: Normal rate, regular rhythm, normal heart sounds and intact distal pulses. Exam reveals no gallop.  No murmur heard. Respiratory: Effort normal and breath sounds normal. No respiratory distress. She has no wheezes. She has no rales.  Mid thoracic spine pain---no striking tenderness  GI: Soft. There is no abdominal tenderness.  Musculoskeletal:        General: No tenderness or edema.     Comments: Normal ROM in left hip  Lymphadenopathy:    She has no cervical adenopathy.  Neurological: She is alert and oriented to person, place, and time.  President--- "Doyle Askew Obama, Bush" [phone] [id number] Recall 3/3  Skin: No rash noted. No erythema.  Psychiatric: She has a normal mood and affect. Her behavior is normal.           Assessment & Plan:

## 2019-03-11 NOTE — Assessment & Plan Note (Signed)
I have personally reviewed the Medicare Annual Wellness questionnaire and have noted 1. The patient's medical and social history 2. Their use of alcohol, tobacco or illicit drugs 3. Their current medications and supplements 4. The patient's functional ability including ADL's, fall risks, home safety risks and hearing or visual             impairment. 5. Diet and physical activities 6. Evidence for depression or mood disorders  The patients weight, height, BMI and visual acuity have been recorded in the chart I have made referrals, counseling and provided education to the patient based review of the above and I have provided the pt with a written personalized care plan for preventive services.  I have provided you with a copy of your personalized plan for preventive services. Please take the time to review along with your updated medication list.  Still prefers no mammograms Had flu vaccine at [facility] Will repeat FIT Discussed exercise

## 2019-03-25 ENCOUNTER — Other Ambulatory Visit: Payer: Self-pay

## 2019-03-25 DIAGNOSIS — Z20822 Contact with and (suspected) exposure to covid-19: Secondary | ICD-10-CM

## 2019-03-25 DIAGNOSIS — Z20828 Contact with and (suspected) exposure to other viral communicable diseases: Secondary | ICD-10-CM | POA: Diagnosis not present

## 2019-03-26 LAB — INPATIENT

## 2019-03-26 LAB — NOVEL CORONAVIRUS, NAA: SARS-CoV-2, NAA: NOT DETECTED

## 2019-05-23 DIAGNOSIS — Z23 Encounter for immunization: Secondary | ICD-10-CM | POA: Diagnosis not present

## 2019-06-03 ENCOUNTER — Other Ambulatory Visit (INDEPENDENT_AMBULATORY_CARE_PROVIDER_SITE_OTHER): Payer: Medicare Other

## 2019-06-03 DIAGNOSIS — Z1211 Encounter for screening for malignant neoplasm of colon: Secondary | ICD-10-CM | POA: Diagnosis not present

## 2019-06-03 LAB — FECAL OCCULT BLOOD, IMMUNOCHEMICAL: Fecal Occult Bld: NEGATIVE

## 2019-06-05 ENCOUNTER — Other Ambulatory Visit: Payer: Self-pay | Admitting: Internal Medicine

## 2019-06-20 DIAGNOSIS — Z23 Encounter for immunization: Secondary | ICD-10-CM | POA: Diagnosis not present

## 2019-09-03 ENCOUNTER — Other Ambulatory Visit: Payer: Self-pay | Admitting: Internal Medicine

## 2019-11-11 IMAGING — DX CHEST - 2 VIEW
2 series · 2 of 2 positions shown · non-contrast
Comparison: 07/07/2017

CLINICAL DATA: Shortness of breath with exertion

EXAM:
CHEST - 2 VIEW

[chest pa]
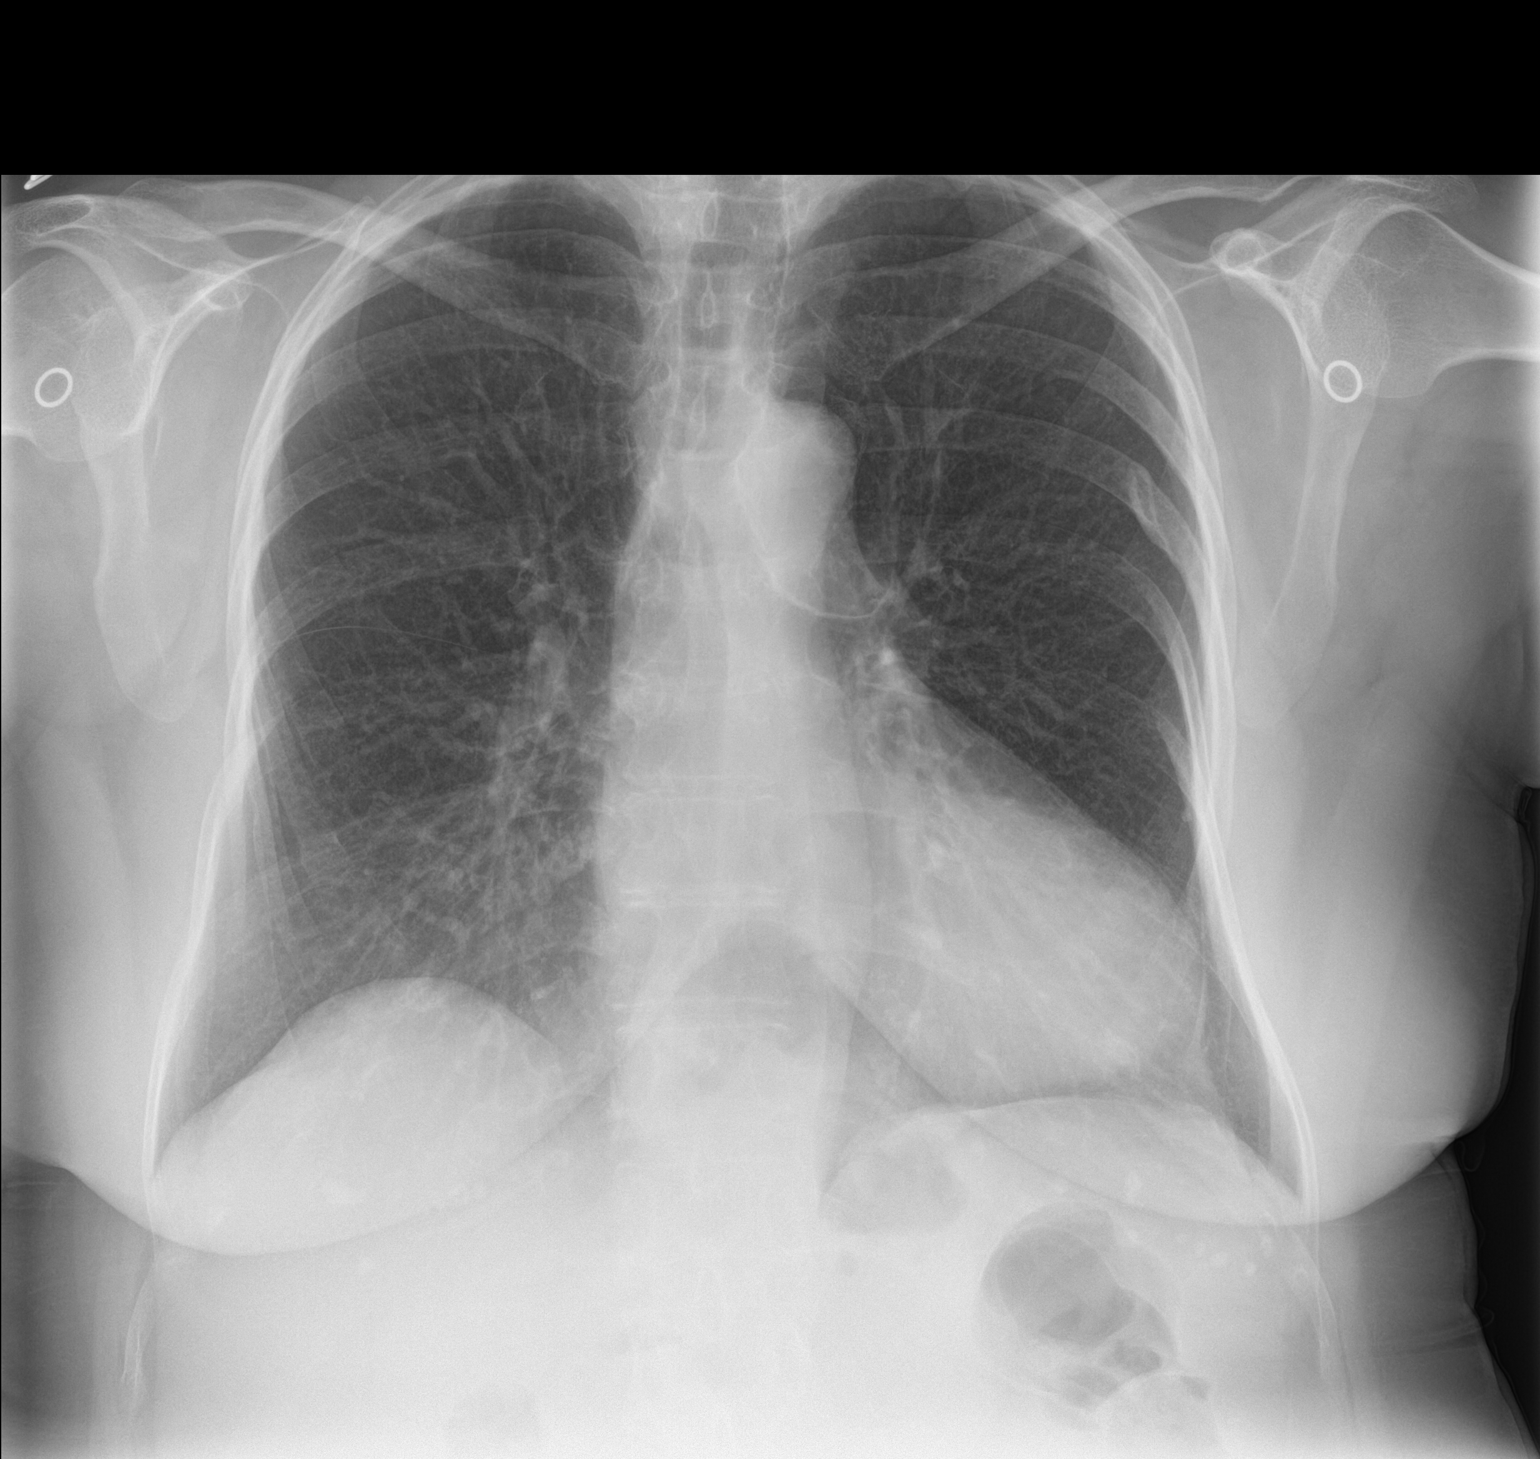

[chest lat]
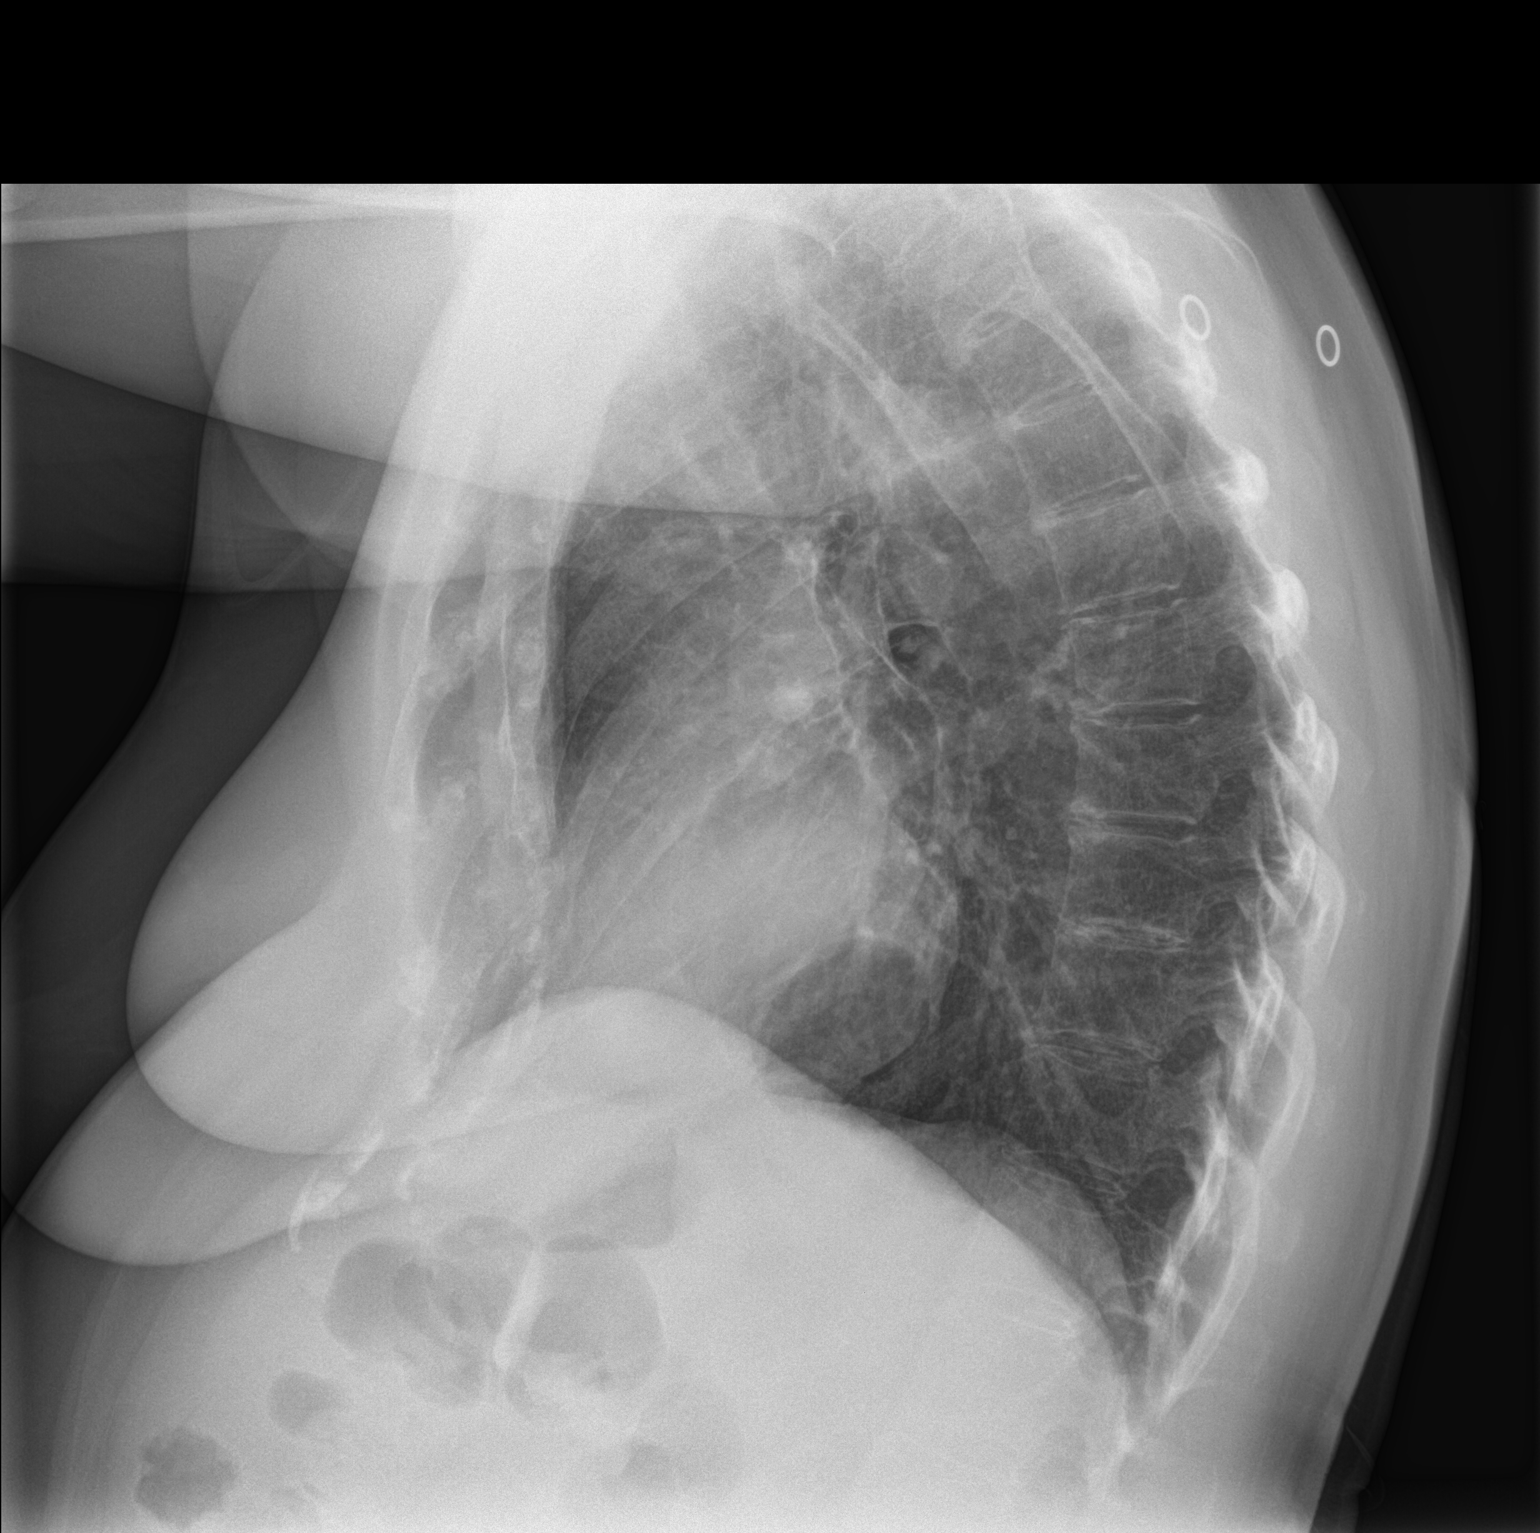

[2 of 2 positions shown; findings below may reference images not displayed]

FINDINGS: Heart is borderline in size. Lungs clear. No effusions. No acute
bony abnormality. Old healed left rib fractures.
IMPRESSION: No active cardiopulmonary disease.

## 2020-01-18 DIAGNOSIS — Z23 Encounter for immunization: Secondary | ICD-10-CM | POA: Diagnosis not present

## 2020-01-30 DIAGNOSIS — H26493 Other secondary cataract, bilateral: Secondary | ICD-10-CM | POA: Diagnosis not present

## 2020-03-13 ENCOUNTER — Encounter: Payer: Self-pay | Admitting: Internal Medicine

## 2020-03-13 ENCOUNTER — Other Ambulatory Visit: Payer: Self-pay

## 2020-03-13 ENCOUNTER — Ambulatory Visit (INDEPENDENT_AMBULATORY_CARE_PROVIDER_SITE_OTHER): Payer: Medicare Other | Admitting: Internal Medicine

## 2020-03-13 VITALS — BP 118/82 | HR 72 | Temp 98.0°F | Ht <= 58 in | Wt 125.0 lb

## 2020-03-13 DIAGNOSIS — M549 Dorsalgia, unspecified: Secondary | ICD-10-CM

## 2020-03-13 DIAGNOSIS — Z1211 Encounter for screening for malignant neoplasm of colon: Secondary | ICD-10-CM

## 2020-03-13 DIAGNOSIS — E785 Hyperlipidemia, unspecified: Secondary | ICD-10-CM

## 2020-03-13 DIAGNOSIS — Z7189 Other specified counseling: Secondary | ICD-10-CM

## 2020-03-13 DIAGNOSIS — Z0000 Encounter for general adult medical examination without abnormal findings: Secondary | ICD-10-CM | POA: Diagnosis not present

## 2020-03-13 DIAGNOSIS — I341 Nonrheumatic mitral (valve) prolapse: Secondary | ICD-10-CM | POA: Diagnosis not present

## 2020-03-13 DIAGNOSIS — G8929 Other chronic pain: Secondary | ICD-10-CM | POA: Diagnosis not present

## 2020-03-13 DIAGNOSIS — I700 Atherosclerosis of aorta: Secondary | ICD-10-CM

## 2020-03-13 DIAGNOSIS — K2210 Ulcer of esophagus without bleeding: Secondary | ICD-10-CM | POA: Diagnosis not present

## 2020-03-13 LAB — COMPREHENSIVE METABOLIC PANEL
ALT: 13 U/L (ref 0–35)
AST: 17 U/L (ref 0–37)
Albumin: 4.5 g/dL (ref 3.5–5.2)
Alkaline Phosphatase: 63 U/L (ref 39–117)
BUN: 17 mg/dL (ref 6–23)
CO2: 30 mEq/L (ref 19–32)
Calcium: 9.8 mg/dL (ref 8.4–10.5)
Chloride: 99 mEq/L (ref 96–112)
Creatinine, Ser: 0.75 mg/dL (ref 0.40–1.20)
GFR: 78.2 mL/min (ref >60.00)
Glucose, Bld: 91 mg/dL (ref 70–99)
Potassium: 4.6 mEq/L (ref 3.5–5.1)
Sodium: 137 mEq/L (ref 135–145)
Total Bilirubin: 0.6 mg/dL (ref 0.2–1.2)
Total Protein: 7.5 g/dL (ref 6.0–8.3)

## 2020-03-13 LAB — T4, FREE: Free T4: 0.68 ng/dL (ref 0.60–1.60)

## 2020-03-13 LAB — CBC
HCT: 38 % (ref 36.0–46.0)
Hemoglobin: 12.9 g/dL (ref 12.0–15.0)
MCHC: 34 g/dL (ref 30.0–36.0)
MCV: 85.2 fl (ref 78.0–100.0)
Platelets: 275 10*3/uL (ref 150.0–400.0)
RBC: 4.47 Mil/uL (ref 3.87–5.11)
RDW: 12.8 % (ref 11.5–15.5)
WBC: 9.2 10*3/uL (ref 4.0–10.5)

## 2020-03-13 LAB — LIPID PANEL
Cholesterol: 194 mg/dL (ref 0–200)
HDL: 42.5 mg/dL (ref >39.00)
Total CHOL/HDL Ratio: 5
Triglycerides: 458 mg/dL — ABNORMAL HIGH (ref 0.0–149.0)

## 2020-03-13 LAB — LDL CHOLESTEROL, DIRECT: Direct LDL: 88 mg/dL

## 2020-03-13 MED ORDER — METOPROLOL SUCCINATE ER 25 MG PO TB24
ORAL_TABLET | ORAL | 3 refills | Status: DC
Start: 2020-03-13 — End: 2021-03-16

## 2020-03-13 MED ORDER — OMEPRAZOLE 40 MG PO CPDR
40.0000 mg | DELAYED_RELEASE_CAPSULE | Freq: Every day | ORAL | 3 refills | Status: DC
Start: 2020-03-13 — End: 2021-03-16

## 2020-03-13 MED ORDER — ATORVASTATIN CALCIUM 10 MG PO TABS
ORAL_TABLET | ORAL | 3 refills | Status: DC
Start: 2020-03-13 — End: 2021-03-16

## 2020-03-13 MED ORDER — MONTELUKAST SODIUM 10 MG PO TABS
ORAL_TABLET | ORAL | 3 refills | Status: DC
Start: 2020-03-13 — End: 2021-03-16

## 2020-03-13 NOTE — Progress Notes (Signed)
Subjective:    Patient ID: Shelby Oliver, female    DOB: January 29, 1946, 74 y.o.   MRN: [id number]  HPI Here for Medicare wellness visit and follow up of chronic health conditions This visit occurred during the SARS-CoV-2 public health emergency.  Safety protocols were in place, including screening questions prior to the visit, additional usage of staff PPE, and extensive cleaning of exam room while observing appropriate contact time as indicated for disinfecting solutions.   Reviewed advanced directives Reviewed other doctors----Dr Perry--GI, Dr Barbee Cough, Dr Marinus Maw, Dr Lenis Noon Not really doing any exercise---still some left thigh symptoms. Discussed No alcohol or tobacco Vision is not great-- some post cataract problems (cloudiness). Considering laser procedure Hearing is fine No falls No regular depression and not anhedonic Independent with instrumental ADLs No memory issues   Is worried about her triglycerides--was never elevated till recently Notes that she doesn't have a great diet (but not really unhealthy) Can't eat "normal" food---stomach doesn't tolerate and affects foodpipe More carbs Continues on the omeprazole with some help--but still has reflux with protein and food other than carbs Did wean omeprazole to every other day, then every 3rd day--but symptoms came back  Known aortic atherosclerosis Continues on the statin  Same thoracic back pain Uses tylenol for this Will also get S-I pain at times--can't lie flat (sleeps in recliner)  Feels her metabolism is low Wonders about her thyroid  Continues on metoprolol to manage palpitations from mitral valve prolapse No chest pain or SOB No dizziness or syncope  Did take bisphosphonate for over 5 years for the osteoporosis Now off this  Current Outpatient Medications on File Prior to Visit  Medication Sig Dispense Refill  . acetaminophen (TYLENOL) 500 MG tablet Take 1,000 mg by mouth every 8  (eight) hours as needed.    Marland Kitchen albuterol (PROVENTIL HFA;VENTOLIN HFA) 108 (90 Base) MCG/ACT inhaler Inhale 2 puffs into the lungs every 6 (six) hours as needed for wheezing or shortness of breath. 1 Inhaler 1  . atorvastatin (LIPITOR) 10 MG tablet TAKE 1 TABLET(10 MG) BY MOUTH DAILY 90 tablet 3  . calcium carbonate (TUMS - DOSED IN MG ELEMENTAL CALCIUM) 500 MG chewable tablet Chew 1 tablet by mouth as needed for indigestion or heartburn.    . cetirizine (ZYRTEC) 10 MG tablet Take 10 mg by mouth daily.    . cholecalciferol (VITAMIN D) 1000 units tablet Take 5,000 Units by mouth once a week. Sundays    . diclofenac sodium (VOLTAREN) 1 % GEL Apply 2 g topically 4 (four) times daily. 3 Tube 5  . metoprolol succinate (TOPROL-XL) 25 MG 24 hr tablet TAKE 1 TABLET(25 MG) BY MOUTH DAILY 90 tablet 3  . montelukast (SINGULAIR) 10 MG tablet TAKE 1 TABLET(10 MG) BY MOUTH DAILY 90 tablet 3  . olopatadine (PATANOL) 0.1 % ophthalmic solution Place 1 drop into both eyes 2 (two) times daily as needed.    Marland Kitchen omeprazole (PRILOSEC) 40 MG capsule Take 1 capsule (40 mg total) by mouth daily. Patient needs office visit for further refills 30 capsule 2  . Polyethyl Glycol-Propyl Glycol (SYSTANE OP) Apply 1 drop to eye as needed.     No current facility-administered medications on file prior to visit.    Allergies  Allergen Reactions  . Avelox [Moxifloxacin Hcl In Nacl] Hives  . Ciprocin-Fluocin-Procin [Fluocinolone]   . Penicillins Hives    Has patient had a PCN reaction causing immediate rash, facial/tongue/throat swelling, SOB or lightheadedness with hypotension: no Has patient had a  PCN reaction causing severe rash involving mucus membranes or skin necrosis: no Has patient had a PCN reaction that required hospitalization: no Has patient had a PCN reaction occurring within the last 10 years: no If all of the above answers are "NO", then may proceed with Cephalosporin use.   . Quinolones Hives    Past Medical  History:  Diagnosis Date  . Chronic kidney disease    RENAL INSUFF  . Complication of anesthesia    RESPIRATORY PROBLEMS WITH SHOULDER SURGERY  . Dysrhythmia   . Edema    FEET/ANKLES  . Erosive esophagitis   . Gallbladder polyp   . GERD (gastroesophageal reflux disease)   . Heart murmur   . Hepatic hemangioma   . Hepatitis    A IN PAST  . History of hiatal hernia   . Hyperlipidemia   . Mild intermittent asthma   . MVP (mitral valve prolapse)    with slight regurgitation  . Orthopnea   . Osteoporosis   . Proteinuria   . Pulmonary nodule 2000's   stable over years--no more testing  . Sleep disturbance    chronic  . Thyroid nodule     Past Surgical History:  Procedure Laterality Date  . APPENDECTOMY    . CARPAL TUNNEL RELEASE Bilateral 1987  . CATARACT EXTRACTION W/PHACO Left 08/17/2017   Procedure: CATARACT EXTRACTION PHACO AND INTRAOCULAR LENS PLACEMENT (IOC);  Surgeon: Eulogio Bear, MD;  Location: [facility] ORS;  Service: Ophthalmology;  Laterality: Left;  [place] 00:29.0 AP% 11.1 CDE 3.21 Fluid Pack lot # [id number]  . CATARACT EXTRACTION W/PHACO Right 09/14/2017   Procedure: CATARACT EXTRACTION PHACO AND INTRAOCULAR LENS PLACEMENT (IOC);  Surgeon: Eulogio Bear, MD;  Location: [facility] ORS;  Service: Ophthalmology;  Laterality: Right;  [place] 00:21.0 AP% 4.7 CDE 1.00 Fluid Pack Lot # [id number]  . GANGLION CYST EXCISION Right   . SHOULDER SURGERY Bilateral 1988, 2001  . TONSILLECTOMY    . TRIGGER FINGER RELEASE Right     Family History  Problem Relation Age of Onset  . Hyperlipidemia Mother   . Arthritis Mother   . Hypertension Mother   . Kidney cancer Mother   . Stroke Father        hemorrhagic  . Arthritis Father   . Heart disease Sister        mitral valve repair  . Hypertension Sister   . Diabetes Neg Hx   . Colon cancer Neg Hx     Social History   Socioeconomic History  . Marital status: Single    Spouse name: Not on file  . Number of children: 0  .  Years of education: Not on file  . Highest education level: Not on file  Occupational History  . Occupation: Cytogeneticist    Comment: Retired  Tobacco Use  . Smoking status: Never Smoker  . Smokeless tobacco: Never Used  Vaping Use  . Vaping Use: Never used  Substance and Sexual Activity  . Alcohol use: No  . Drug use: No  . Sexual activity: Not on file  Other Topics Concern  . Not on file  Social History Narrative   Wants to be called Shelby Oliver   Has living will   [place] are her health care POA----Shelby Oliver,   Would accept resuscitation   No tube feeds if cognitively unaware   Social Determinants of Health   Financial Resource Strain:   . Difficulty of Paying Living Expenses: Not on  file  Food Insecurity:   . Worried About Charity fundraiser in the Last Year: Not on file  . Ran Out of Food in the Last Year: Not on file  Transportation Needs:   . Lack of Transportation (Medical): Not on file  . Lack of Transportation (Non-Medical): Not on file  Physical Activity:   . Days of Exercise per Week: Not on file  . Minutes of Exercise per Session: Not on file  Stress:   . Feeling of Stress : Not on file  Social Connections:   . Frequency of Communication with Friends and Family: Not on file  . Frequency of Social Gatherings with Friends and Family: Not on file  . Attends Religious Services: Not on file  . Active Member of Clubs or Organizations: Not on file  . Attends Archivist Meetings: Not on file  . Marital Status: Not on file  Intimate Partner Violence:   . Fear of Current or Ex-Partner: Not on file  . Emotionally Abused: Not on file  . Physically Abused: Not on file  . Sexually Abused: Not on file   Review of Systems Weight is down some from last year Still not sleeping well---frequent awakening Mostly stays in---likes to sew for family. Some social engagement at [facility] not much Wears seat  belt Teeth okay---due for dentist Bowels are okay---uses metamucil. No blood unless she has a hard stool. No worrisome skin lesions. Just skin tags around neck No urinary problems or incontinence No other significant joint pains---mild in knees, hands, ankles    Objective:   Physical Exam Constitutional:      Appearance: Normal appearance.  HENT:     Mouth/Throat:     Comments: No lesions Eyes:     Conjunctiva/sclera: Conjunctivae normal.     Pupils: Pupils are equal, round, and reactive to light.  Cardiovascular:     Rate and Rhythm: Normal rate and regular rhythm.     Pulses: Normal pulses.     Heart sounds: No murmur heard.  No gallop.   Pulmonary:     Effort: Pulmonary effort is normal.     Breath sounds: Normal breath sounds. No wheezing or rales.  Abdominal:     Palpations: Abdomen is soft.     Tenderness: There is no abdominal tenderness.  Musculoskeletal:     Cervical back: Neck supple.     Right lower leg: No edema.     Left lower leg: No edema.  Lymphadenopathy:     Cervical: No cervical adenopathy.  Skin:    General: Skin is warm.     Findings: No rash.  Neurological:     Mental Status: She is alert and oriented to person, place, and time.     Comments: President--- "Jonna Munro, Trump, OBama" [phone] D-l-r-o-w Recall 3/3  Psychiatric:        Mood and Affect: Mood normal.        Behavior: Behavior normal.            Assessment & Plan:

## 2020-03-13 NOTE — Assessment & Plan Note (Signed)
Limits her somewhat--but discussed exercise Uses tylenol prn

## 2020-03-13 NOTE — Assessment & Plan Note (Signed)
I have personally reviewed the Medicare Annual Wellness questionnaire and have noted 1. The patient's medical and social history 2. Their use of alcohol, tobacco or illicit drugs 3. Their current medications and supplements 4. The patient's functional ability including ADL's, fall risks, home safety risks and hearing or visual             impairment. 5. Diet and physical activities 6. Evidence for depression or mood disorders  The patients weight, height, BMI and visual acuity have been recorded in the chart I have made referrals, counseling and provided education to the patient based review of the above and I have provided the pt with a written personalized care plan for preventive services.  I have provided you with a copy of your personalized plan for preventive services. Please take the time to review along with your updated medication list.  Still prefers no mammogram Will do FIT Had flu vaccine COVID booster next week Discussed exercise

## 2020-03-13 NOTE — Assessment & Plan Note (Signed)
On imaging Is on statin 

## 2020-03-13 NOTE — Assessment & Plan Note (Signed)
Is controlled fairly well on PPI Discussed lowest effective dose---every 2nd to 3rd day

## 2020-03-13 NOTE — Assessment & Plan Note (Signed)
Symptoms controlled with metoprolol No audible murmur

## 2020-03-13 NOTE — Assessment & Plan Note (Signed)
No problems with statin 

## 2020-03-13 NOTE — Progress Notes (Signed)
Hearing Screening   Method: Audiometry   125Hz  250Hz  500Hz  1000Hz  2000Hz  3000Hz  4000Hz  6000Hz  8000Hz   Right ear:   25 25 25  25     Left ear:   25 25 25  25     Vision Screening Comments: September 2021

## 2020-03-13 NOTE — Assessment & Plan Note (Signed)
See social history 

## 2020-03-20 DIAGNOSIS — Z23 Encounter for immunization: Secondary | ICD-10-CM | POA: Diagnosis not present

## 2020-04-21 ENCOUNTER — Other Ambulatory Visit (INDEPENDENT_AMBULATORY_CARE_PROVIDER_SITE_OTHER): Payer: Medicare Other

## 2020-04-21 DIAGNOSIS — Z1211 Encounter for screening for malignant neoplasm of colon: Secondary | ICD-10-CM | POA: Diagnosis not present

## 2020-04-21 LAB — FECAL OCCULT BLOOD, IMMUNOCHEMICAL: Fecal Occult Bld: NEGATIVE

## 2020-11-20 DIAGNOSIS — Z20822 Contact with and (suspected) exposure to covid-19: Secondary | ICD-10-CM | POA: Diagnosis not present

## 2021-01-28 DIAGNOSIS — Z23 Encounter for immunization: Secondary | ICD-10-CM | POA: Diagnosis not present

## 2021-02-18 DIAGNOSIS — Z23 Encounter for immunization: Secondary | ICD-10-CM | POA: Diagnosis not present

## 2021-03-16 ENCOUNTER — Other Ambulatory Visit: Payer: Self-pay

## 2021-03-16 ENCOUNTER — Encounter: Payer: Self-pay | Admitting: Internal Medicine

## 2021-03-16 ENCOUNTER — Ambulatory Visit (INDEPENDENT_AMBULATORY_CARE_PROVIDER_SITE_OTHER): Payer: Medicare Other | Admitting: Internal Medicine

## 2021-03-16 VITALS — BP 108/70 | HR 83 | Temp 98.2°F | Ht <= 58 in | Wt 132.0 lb

## 2021-03-16 DIAGNOSIS — I341 Nonrheumatic mitral (valve) prolapse: Secondary | ICD-10-CM | POA: Diagnosis not present

## 2021-03-16 DIAGNOSIS — Z0000 Encounter for general adult medical examination without abnormal findings: Secondary | ICD-10-CM | POA: Diagnosis not present

## 2021-03-16 DIAGNOSIS — M659 Unspecified synovitis and tenosynovitis, unspecified site: Secondary | ICD-10-CM

## 2021-03-16 DIAGNOSIS — Z7189 Other specified counseling: Secondary | ICD-10-CM | POA: Diagnosis not present

## 2021-03-16 DIAGNOSIS — G8929 Other chronic pain: Secondary | ICD-10-CM | POA: Diagnosis not present

## 2021-03-16 DIAGNOSIS — I700 Atherosclerosis of aorta: Secondary | ICD-10-CM | POA: Diagnosis not present

## 2021-03-16 DIAGNOSIS — J4520 Mild intermittent asthma, uncomplicated: Secondary | ICD-10-CM

## 2021-03-16 DIAGNOSIS — Z1211 Encounter for screening for malignant neoplasm of colon: Secondary | ICD-10-CM | POA: Diagnosis not present

## 2021-03-16 DIAGNOSIS — M549 Dorsalgia, unspecified: Secondary | ICD-10-CM

## 2021-03-16 DIAGNOSIS — K2210 Ulcer of esophagus without bleeding: Secondary | ICD-10-CM

## 2021-03-16 LAB — CBC
HCT: 37.5 % (ref 36.0–46.0)
Hemoglobin: 12.7 g/dL (ref 12.0–15.0)
MCHC: 33.9 g/dL (ref 30.0–36.0)
MCV: 87 fl (ref 78.0–100.0)
Platelets: 268 10*3/uL (ref 150.0–400.0)
RBC: 4.32 Mil/uL (ref 3.87–5.11)
RDW: 12.7 % (ref 11.5–15.5)
WBC: 7.8 10*3/uL (ref 4.0–10.5)

## 2021-03-16 LAB — RENAL FUNCTION PANEL
Albumin: 4.4 g/dL (ref 3.5–5.2)
BUN: 18 mg/dL (ref 6–23)
CO2: 27 mEq/L (ref 19–32)
Calcium: 9.5 mg/dL (ref 8.4–10.5)
Chloride: 101 mEq/L (ref 96–112)
Creatinine, Ser: 0.66 mg/dL (ref 0.40–1.20)
GFR: 85.56 mL/min (ref >60.00)
Glucose, Bld: 97 mg/dL (ref 70–99)
Phosphorus: 3.4 mg/dL (ref 2.3–4.6)
Potassium: 4.1 mEq/L (ref 3.5–5.1)
Sodium: 136 mEq/L (ref 135–145)

## 2021-03-16 LAB — HEPATIC FUNCTION PANEL
ALT: 20 U/L (ref 0–35)
AST: 19 U/L (ref 0–37)
Albumin: 4.4 g/dL (ref 3.5–5.2)
Alkaline Phosphatase: 57 U/L (ref 39–117)
Bilirubin, Direct: 0.1 mg/dL (ref 0.0–0.3)
Total Bilirubin: 0.7 mg/dL (ref 0.2–1.2)
Total Protein: 7.5 g/dL (ref 6.0–8.3)

## 2021-03-16 LAB — LDL CHOLESTEROL, DIRECT: Direct LDL: 80 mg/dL

## 2021-03-16 LAB — LIPID PANEL
Cholesterol: 203 mg/dL — ABNORMAL HIGH (ref 0–200)
HDL: 39.9 mg/dL (ref >39.00)
Total CHOL/HDL Ratio: 5
Triglycerides: 546 mg/dL — ABNORMAL HIGH (ref 0.0–149.0)

## 2021-03-16 LAB — SEDIMENTATION RATE: Sed Rate: 40 mm/hr — ABNORMAL HIGH (ref 0–30)

## 2021-03-16 MED ORDER — METOPROLOL SUCCINATE ER 25 MG PO TB24: ORAL_TABLET | ORAL | 3 refills | Status: DC

## 2021-03-16 MED ORDER — MONTELUKAST SODIUM 10 MG PO TABS: ORAL_TABLET | ORAL | 3 refills | Status: DC

## 2021-03-16 MED ORDER — ATORVASTATIN CALCIUM 10 MG PO TABS: ORAL_TABLET | ORAL | 3 refills | Status: DC

## 2021-03-16 NOTE — Progress Notes (Signed)
   Subjective:    Patient ID: Shelby Oliver, female    DOB: 11/22/1945, 75 y.o.   MRN: [id number]  HPI Here for Medicare wellness visit and follow up of chronic health conditions This visit occurred during the SARS-CoV-2 public health emergency.  Safety protocols were in place, including screening questions prior to the visit, additional usage of staff PPE, and extensive cleaning of exam room while observing appropriate contact time as indicated for disinfecting solutions.   Reviewed advanced directives Reviewed other doctors---Dr King--ophthal, Dr Perry---GI, Dr Guinevere Ferrari, Dr Evorn Gong ---derm No hospitalizations or surgery in the past year No alcohol or tobacco Vision is okay Hearing is good No falls Not really depressed or anhedonic Independent with instrumental ADLs No sig memory issues  Bad year emotionally Older sister with CHF--valvular disease  Having S-I pain Trouble walking or sitting Can't sleep in bed Tylenol helps a little Past steroid injections by Dr Nyra Jabs really helpful More pain with her pectus deformity Some hand pain---still has left 2nd triggering at night  Known aortic atherosclerosis On statin  Will have occasional palpitations--quick jump Continues on metoprolol No chest pain--other than the sternal pain No regular breathing problems now---unless she bends over (but not doing anything)  Stopped omeprazole--had been weaning it Now having trouble with any spice--gets substernal burning  Some dysphagia Has had some cramps after faro grain    Review of Systems Appetite is okay Disturbed by continued weight gain Sleep is not great---awake a lot after falling asleep early Legs feel 'heavy" after chores--as the day goes on Wears seat belt Getting some restorative dental work Bowels move okay--no blood (or rare on toilet paper if strains) Voids okay--no incontinence--does have to double void. Some urgency No suspicious skin lesions--no  recent derm visit    Objective:   Physical Exam Constitutional:      Appearance: Normal appearance.  HENT:     Mouth/Throat:     Comments: No lesions Eyes:     Conjunctiva/sclera: Conjunctivae normal.     Pupils: Pupils are equal, round, and reactive to light.  Cardiovascular:     Rate and Rhythm: Normal rate and regular rhythm.     Pulses: Normal pulses.     Heart sounds: No murmur heard.   No gallop.  Pulmonary:     Effort: Pulmonary effort is normal.     Breath sounds: Normal breath sounds. No wheezing or rales.  Abdominal:     Palpations: Abdomen is soft.     Tenderness: There is no abdominal tenderness.  Musculoskeletal:     Cervical back: Neck supple.     Right lower leg: No edema.     Left lower leg: No edema.     Comments: Some synovitis in both hands--mostly 2nd PIPs  Lymphadenopathy:     Cervical: No cervical adenopathy.  Skin:    Findings: No lesion or rash.  Neurological:     Mental Status: She is alert and oriented to person, place, and time.     Comments: President---"Biden, Trump, Obama" [id number] D-l-r-o-w Recall 3/3  Psychiatric:        Mood and Affect: Mood normal.        Behavior: Behavior normal.           Assessment & Plan:

## 2021-03-16 NOTE — Progress Notes (Signed)
Hearing Screening - Comments:: Passed whisper test Vision Screening - Comments:: September 2021. Was scheduled in September 2022, but had to r/s to a few months later.

## 2021-03-16 NOTE — Assessment & Plan Note (Signed)
DNR done today 

## 2021-03-16 NOTE — Assessment & Plan Note (Signed)
I have personally reviewed the Medicare Annual Wellness questionnaire and have noted 1. The patient's medical and social history 2. Their use of alcohol, tobacco or illicit drugs 3. Their current medications and supplements 4. The patient's functional ability including ADL's, fall risks, home safety risks and hearing or visual             impairment. 5. Diet and physical activities 6. Evidence for depression or mood disorders  The patients weight, height, BMI and visual acuity have been recorded in the chart I have made referrals, counseling and provided education to the patient based review of the above and I have provided the pt with a written personalized care plan for preventive services.  I have provided you with a copy of your personalized plan for preventive services. Please take the time to review along with your updated medication list.  Will check FIT Still prefers no mammogram Had bivalent COVID and flu vaccines Hasn't been able to exercise

## 2021-03-16 NOTE — Assessment & Plan Note (Signed)
With increased symptoms Asked her to restart the omeprazole--at least intermittently May need to go back to Dr Henrene Pastor

## 2021-03-16 NOTE — Assessment & Plan Note (Signed)
Mostly controlled with low dose metoprolol

## 2021-03-16 NOTE — Assessment & Plan Note (Signed)
Quiet (and allergies better) on the montelukast

## 2021-03-16 NOTE — Assessment & Plan Note (Signed)
Discussed trying lidocaine on low back Doesn't want to go back to physiatrist

## 2021-03-16 NOTE — Assessment & Plan Note (Signed)
On imaging Continues on the atorvastatin

## 2021-03-17 LAB — ANTINUCLEAR ANTIBODIES, IFA: ANA Titer 1: NEGATIVE

## 2021-03-17 LAB — RHEUMATOID FACTOR: Rheumatoid fact SerPl-aCnc: <14 IU/mL (ref <14)

## 2021-04-15 DIAGNOSIS — Z20822 Contact with and (suspected) exposure to covid-19: Secondary | ICD-10-CM | POA: Diagnosis not present

## 2021-05-14 ENCOUNTER — Other Ambulatory Visit (INDEPENDENT_AMBULATORY_CARE_PROVIDER_SITE_OTHER): Payer: Medicare Other

## 2021-05-14 DIAGNOSIS — Z1211 Encounter for screening for malignant neoplasm of colon: Secondary | ICD-10-CM | POA: Diagnosis not present

## 2021-05-14 LAB — FECAL OCCULT BLOOD, IMMUNOCHEMICAL: Fecal Occult Bld: NEGATIVE

## 2021-07-07 DIAGNOSIS — H43811 Vitreous degeneration, right eye: Secondary | ICD-10-CM | POA: Diagnosis not present

## 2021-07-21 DIAGNOSIS — Z20822 Contact with and (suspected) exposure to covid-19: Secondary | ICD-10-CM | POA: Diagnosis not present

## 2021-07-24 DIAGNOSIS — Z20822 Contact with and (suspected) exposure to covid-19: Secondary | ICD-10-CM | POA: Diagnosis not present

## 2021-08-23 ENCOUNTER — Ambulatory Visit (INDEPENDENT_AMBULATORY_CARE_PROVIDER_SITE_OTHER): Payer: Medicare Other | Admitting: Family

## 2021-08-23 ENCOUNTER — Encounter: Payer: Self-pay | Admitting: Family

## 2021-08-23 VITALS — BP 108/70 | HR 92 | Temp 99.9°F | Resp 16 | Ht <= 58 in | Wt 134.2 lb

## 2021-08-23 DIAGNOSIS — H6692 Otitis media, unspecified, left ear: Secondary | ICD-10-CM | POA: Diagnosis not present

## 2021-08-23 DIAGNOSIS — H109 Unspecified conjunctivitis: Secondary | ICD-10-CM

## 2021-08-23 DIAGNOSIS — Z20822 Contact with and (suspected) exposure to covid-19: Secondary | ICD-10-CM | POA: Diagnosis not present

## 2021-08-23 MED ORDER — POLYMYXIN B-TRIMETHOPRIM 10000-0.1 UNIT/ML-% OP SOLN: 1.0000 [drp] | OPHTHALMIC | 0 refills | Status: AC

## 2021-08-23 MED ORDER — CLINDAMYCIN HCL 300 MG PO CAPS: 300.0000 mg | ORAL_CAPSULE | Freq: Three times a day (TID) | ORAL | 0 refills | Status: AC

## 2021-08-23 NOTE — Assessment & Plan Note (Signed)
pcn with rash allergy, suggested cefdinir however pt politely declines states she is uncomfortable trying new medication. Has tolerated clindamycin in the past, and so rx clindamycin 300 mg tid x 10 days sent to pharmacy.  ?Did advise pt this is alternative regimen so if no improvement in 48-72 hours let me know and we will have to change to cefdinir.  ? ?Take antibiotic as prescribed. Increase oral fluids. Pt to f/u if sx worsen and or fail to improve in 2-3 days. ? ?

## 2021-08-23 NOTE — Progress Notes (Signed)
? ?Established Patient Office Visit ? ?Subjective:  ?Patient ID: Shelby Oliver, female    DOB: January 01, 1946  Age: 76 y.o. MRN: [id number] ? ?CC:  ?Chief Complaint  ?Patient presents with  ? Cough  ?  Covid test neg on 4-13  ? Headache  ?  From coughing so much.  ? Ear Pain  ?  draining  ? Eye Pain  ? ? ?HPI ?Shelby Oliver is here today with concerns.  ? ?For the last six days with left eye draining, left ear pain and some drainage from the ear. Did start with a sore throat and since has had headache and pnd. Has bene coughing here and there.  ? ?Honey and sugar water with only mild relief.  ?Last night with fever up to 101.  ?Covid tested four days ago and was negative. ? ?Past Medical History:  ?Diagnosis Date  ? Chronic kidney disease   ? RENAL INSUFF  ? Complication of anesthesia   ? RESPIRATORY PROBLEMS WITH SHOULDER SURGERY  ? Dysrhythmia   ? Edema   ? FEET/ANKLES  ? Erosive esophagitis   ? Gallbladder polyp   ? GERD (gastroesophageal reflux disease)   ? Heart murmur   ? Hepatic hemangioma   ? Hepatitis   ? A IN PAST  ? History of hiatal hernia   ? Hyperlipidemia   ? Mild intermittent asthma   ? MVP (mitral valve prolapse)   ? with slight regurgitation  ? Orthopnea   ? Osteoporosis   ? Proteinuria   ? Pulmonary nodule 2000's  ? stable over years--no more testing  ? Sleep disturbance   ? chronic  ? Thyroid nodule   ? ? ?Past Surgical History:  ?Procedure Laterality Date  ? APPENDECTOMY    ? CARPAL TUNNEL RELEASE Bilateral 1987  ? CATARACT EXTRACTION W/PHACO Left 08/17/2017  ? Procedure: CATARACT EXTRACTION PHACO AND INTRAOCULAR LENS PLACEMENT (IOC);  Surgeon: Eulogio Bear, MD;  Location: [facility];  Service: Ophthalmology;  Laterality: Left;  [place] 00:29.0 ?AP% 11.1 ?CDE 3.21 ?Fluid Pack lot # [id number]  ? CATARACT EXTRACTION W/PHACO Right 09/14/2017  ? Procedure: CATARACT EXTRACTION PHACO AND INTRAOCULAR LENS PLACEMENT (IOC);  Surgeon: Eulogio Bear, MD;  Location: [facility];  Service: Ophthalmology;   Laterality: Right;  [place] 00:21.0 ?AP% 4.7 ?CDE 1.00 ?Fluid Pack Lot # [id number]  ? GANGLION CYST EXCISION Right   ? SHOULDER SURGERY Bilateral 1988, 2001  ? TONSILLECTOMY    ? TRIGGER FINGER RELEASE Right   ? ? ?Family History  ?Problem Relation Age of Onset  ? Hyperlipidemia Mother   ? Arthritis Mother   ? Hypertension Mother   ? Kidney cancer Mother   ? Stroke Father   ?     hemorrhagic  ? Arthritis Father   ? Heart disease Sister   ?     mitral valve repair  ? Hypertension Sister   ? Congestive Heart Failure Sister   ? Diabetes Neg Hx   ? Colon cancer Neg Hx   ? ? ?Social History  ? ?Socioeconomic History  ? Marital status: Single  ?  Spouse name: Not on file  ? Number of children: 0  ? Years of education: Not on file  ? Highest education level: Not on file  ?Occupational History  ? Occupation: Cytogeneticist  ?  Comment: Retired  ?Tobacco Use  ? Smoking status: Never  ? Smokeless tobacco: Never  ?Vaping Use  ? Vaping Use: Never used  ?Substance  and Sexual Activity  ? Alcohol use: No  ? Drug use: No  ? Sexual activity: Not on file  ?Other Topics Concern  ? Not on file  ?Social History Narrative  ? Wants to be called [name]  ? Has living will  ? Niece is her health care POA----Olivia [place] Crane,  ? Requests DNR--done 03/16/21  ? No tube feeds if cognitively unaware  ? ?Social Determinants of Health  ? ?Financial Resource Strain: Not on file  ?Food Insecurity: Not on file  ?Transportation Needs: Not on file  ?Physical Activity: Not on file  ?Stress: Not on file  ?Social Connections: Not on file  ?Intimate Partner Violence: Not on file  ? ? ?Outpatient Medications Prior to Visit  ?Medication Sig Dispense Refill  ? acetaminophen (TYLENOL) 500 MG tablet Take 1,000 mg by mouth every 8 (eight) hours as needed.    ? atorvastatin (LIPITOR) 10 MG tablet TAKE 1 TABLET(10 MG) BY MOUTH DAILY 90 tablet 3  ? calcium carbonate (TUMS - DOSED IN MG ELEMENTAL CALCIUM) 500 MG chewable tablet Chew 1 tablet by mouth as  needed for indigestion or heartburn.    ? cetirizine (ZYRTEC) 10 MG tablet Take 10 mg by mouth daily.    ? cholecalciferol (VITAMIN D) 1000 units tablet Take 5,000 Units by mouth once a week. Sundays    ? metoprolol succinate (TOPROL-XL) 25 MG 24 hr tablet TAKE 1 TABLET(25 MG) BY MOUTH DAILY 90 tablet 3  ? montelukast (SINGULAIR) 10 MG tablet TAKE 1 TABLET(10 MG) BY MOUTH DAILY 90 tablet 3  ? olopatadine (PATANOL) 0.1 % ophthalmic solution Place 1 drop into both eyes 2 (two) times daily as needed.    ? Polyethyl Glycol-Propyl Glycol (SYSTANE OP) Apply 1 drop to eye as needed.    ? ?No facility-administered medications prior to visit.  ? ? ?Allergies  ?Allergen Reactions  ? Azithromycin Shortness Of Breath  ?  SOB abd pain diarrhea  ? Avelox [Moxifloxacin Hcl In Nacl] Hives  ? Ciprocin-Fluocin-Procin [Fluocinolone]   ? Penicillins Hives  ?  Has patient had a PCN reaction causing immediate rash, facial/tongue/throat swelling, SOB or lightheadedness with hypotension: no ?Has patient had a PCN reaction causing severe rash involving mucus membranes or skin necrosis: no ?Has patient had a PCN reaction that required hospitalization: no ?Has patient had a PCN reaction occurring within the last 10 years: no ?If all of the above answers are "NO", then may proceed with Cephalosporin use. ?  ? Quinolones Hives  ? ? ?ROS ?Review of Systems  ?Constitutional:  Positive for fever (101 last night). Negative for chills.  ?HENT:  Positive for congestion, ear pain (left), postnasal drip, rhinorrhea and sore throat. Negative for sinus pressure and sinus pain.   ?Respiratory:  Positive for cough. Negative for chest tightness, shortness of breath and wheezing.   ?Cardiovascular:  Negative for chest pain and palpitations.  ? ?  ?Objective:  ?  ?Physical Exam ?Constitutional:   ?   General: She is not in acute distress. ?   Appearance: She is obese. She is not ill-appearing, toxic-appearing or diaphoretic.  ?HENT:  ?   Head: Normocephalic.   ?   Right Ear: Hearing, tympanic membrane, ear canal and external ear normal.  ?   Left Ear: Hearing, ear canal and external ear normal. Tenderness present. A middle ear effusion is present. Tympanic membrane is erythematous and bulging. Tympanic membrane is not perforated.  ?   Nose: Mucosal edema, congestion and rhinorrhea present.  ?  Right Turbinates: Enlarged.  ?   Mouth/Throat:  ?   Mouth: Mucous membranes are moist.  ?Eyes:  ?   General: Lids are normal. Vision grossly intact.     ?   Right eye: No foreign body.     ?   Left eye: Discharge (watery) present.No foreign body.  ?   Extraocular Movements: Extraocular movements intact.  ?   Pupils: Pupils are equal, round, and reactive to light.  ?   Comments: Crusting of the left lid yesterday am  ?Cardiovascular:  ?   Rate and Rhythm: Normal rate and regular rhythm.  ?Pulmonary:  ?   Effort: Pulmonary effort is normal. No respiratory distress.  ?Musculoskeletal:  ?   Cervical back: Normal range of motion.  ?Neurological:  ?   Mental Status: She is alert.  ? ? ?BP 108/70   Pulse 92   Temp 99.9 ?F (37.7 ?C)   Resp 16   Ht '4\' 6"'$  (1.372 m)   Wt 134 lb 4 oz (60.9 kg)   SpO2 98%   BMI 32.37 kg/m?  ?Wt Readings from Last 3 Encounters:  ?08/23/21 134 lb 4 oz (60.9 kg)  ?03/16/21 132 lb (59.9 kg)  ?03/13/20 125 lb (56.7 kg)  ? ? ? ?Health Maintenance Due  ?Topic Date Due  ? Hepatitis C Screening  Never done  ? ? ?There are no preventive care reminders to display for this patient. ? ?Lab Results  ?Component Value Date  ? TSH 2.47 02/18/2015  ? ?Lab Results  ?Component Value Date  ? WBC 7.8 03/16/2021  ? HGB 12.7 03/16/2021  ? HCT 37.5 03/16/2021  ? MCV 87.0 03/16/2021  ? PLT 268.0 03/16/2021  ? ?Lab Results  ?Component Value Date  ? NA 136 03/16/2021  ? K 4.1 03/16/2021  ? CO2 27 03/16/2021  ? GLUCOSE 97 03/16/2021  ? BUN 18 03/16/2021  ? CREATININE 0.66 03/16/2021  ? BILITOT 0.7 03/16/2021  ? ALKPHOS 57 03/16/2021  ? AST 19 03/16/2021  ? ALT 20 03/16/2021  ? PROT  7.5 03/16/2021  ? ALBUMIN 4.4 03/16/2021  ? ALBUMIN 4.4 03/16/2021  ? CALCIUM 9.5 03/16/2021  ? GFR 85.56 03/16/2021  ? ?No results found for: HGBA1C ? ?  ?Assessment & Plan:  ? ?Problem List Items Addressed This

## 2021-08-23 NOTE — Assessment & Plan Note (Signed)
rx sent for polytrim b solution  ?Discussed how to wash eye appropriately with warm warm cloth from inner to outer canthus. Wash bed sheets and pillow cases to prevent re-infection. Frequent handwashing. If no improvement in 24-48 hours with eye drops as prescribed, please call office.  ? ?

## 2021-08-23 NOTE — Patient Instructions (Signed)
Antibiotic sent to preferred pharmacy.   Please increase oral fluids, steamy hot shower/humidifier prn.  Please follow up if no improvement in 2-3 days.   It was a pleasure seeing you today! Please do not hesitate to reach out with any questions and or concerns.  Regards,   Rastus Borton   

## 2021-09-01 ENCOUNTER — Encounter: Payer: Self-pay | Admitting: Internal Medicine

## 2021-09-01 ENCOUNTER — Ambulatory Visit (INDEPENDENT_AMBULATORY_CARE_PROVIDER_SITE_OTHER): Payer: Medicare Other | Admitting: Internal Medicine

## 2021-09-01 DIAGNOSIS — H6692 Otitis media, unspecified, left ear: Secondary | ICD-10-CM | POA: Diagnosis not present

## 2021-09-01 NOTE — Progress Notes (Signed)
? ?Subjective:  ? ? Patient ID: Shelby Oliver, female    DOB: 11-22-1945, 76 y.o.   MRN: [id number] ? ?HPI ?Here for a recheck on her left ear ? ?Had left ear pain, bad nasal discharge ?Also eye issues and sore throat ?Thought it was allergies at first---but then more cough, etc ?Finally had increased fever--and discharge from left ear --yellow with pink tinge ? ?Seen by Lawerance Bach ?Left OM diagnosed ?Treated with clinda--patient choice ? ?Ear is some better now---only just yesterday ?Still has sense of fullness there and some cough ?Decreased hearing on left ? ?Current Outpatient Medications on File Prior to Visit  ?Medication Sig Dispense Refill  ? acetaminophen (TYLENOL) 500 MG tablet Take 1,000 mg by mouth every 8 (eight) hours as needed.    ? atorvastatin (LIPITOR) 10 MG tablet TAKE 1 TABLET(10 MG) BY MOUTH DAILY 90 tablet 3  ? calcium carbonate (TUMS - DOSED IN MG ELEMENTAL CALCIUM) 500 MG chewable tablet Chew 1 tablet by mouth as needed for indigestion or heartburn.    ? cetirizine (ZYRTEC) 10 MG tablet Take 10 mg by mouth daily.    ? cholecalciferol (VITAMIN D) 1000 units tablet Take 5,000 Units by mouth once a week. Sundays    ? clindamycin (CLEOCIN) 300 MG capsule Take 1 capsule (300 mg total) by mouth 3 (three) times daily for 10 days. 30 capsule 0  ? metoprolol succinate (TOPROL-XL) 25 MG 24 hr tablet TAKE 1 TABLET(25 MG) BY MOUTH DAILY 90 tablet 3  ? montelukast (SINGULAIR) 10 MG tablet TAKE 1 TABLET(10 MG) BY MOUTH DAILY 90 tablet 3  ? olopatadine (PATANOL) 0.1 % ophthalmic solution Place 1 drop into both eyes 2 (two) times daily as needed.    ? Polyethyl Glycol-Propyl Glycol (SYSTANE OP) Apply 1 drop to eye as needed.    ? ?No current facility-administered medications on file prior to visit.  ? ? ?Allergies  ?Allergen Reactions  ? Azithromycin Shortness Of Breath  ?  SOB abd pain diarrhea  ? Avelox [Moxifloxacin Hcl In Nacl] Hives  ? Ciprocin-Fluocin-Procin [Fluocinolone]   ? Penicillins Hives  ?  Has  patient had a PCN reaction causing immediate rash, facial/tongue/throat swelling, SOB or lightheadedness with hypotension: no ?Has patient had a PCN reaction causing severe rash involving mucus membranes or skin necrosis: no ?Has patient had a PCN reaction that required hospitalization: no ?Has patient had a PCN reaction occurring within the last 10 years: no ?If all of the above answers are "NO", then may proceed with Cephalosporin use. ?  ? Quinolones Hives  ? ? ?Past Medical History:  ?Diagnosis Date  ? Chronic kidney disease   ? RENAL INSUFF  ? Complication of anesthesia   ? RESPIRATORY PROBLEMS WITH SHOULDER SURGERY  ? Dysrhythmia   ? Edema   ? FEET/ANKLES  ? Erosive esophagitis   ? Gallbladder polyp   ? GERD (gastroesophageal reflux disease)   ? Heart murmur   ? Hepatic hemangioma   ? Hepatitis   ? A IN PAST  ? History of hiatal hernia   ? Hyperlipidemia   ? Mild intermittent asthma   ? MVP (mitral valve prolapse)   ? with slight regurgitation  ? Orthopnea   ? Osteoporosis   ? Proteinuria   ? Pulmonary nodule 2000's  ? stable over years--no more testing  ? Sleep disturbance   ? chronic  ? Thyroid nodule   ? ? ?Past Surgical History:  ?Procedure Laterality Date  ? APPENDECTOMY    ?  CARPAL TUNNEL RELEASE Bilateral 1987  ? CATARACT EXTRACTION W/PHACO Left 08/17/2017  ? Procedure: CATARACT EXTRACTION PHACO AND INTRAOCULAR LENS PLACEMENT (IOC);  Surgeon: Eulogio Bear, MD;  Location: [facility];  Service: Ophthalmology;  Laterality: Left;  [place] 00:29.0 ?AP% 11.1 ?CDE 3.21 ?Fluid Pack lot # [id number]  ? CATARACT EXTRACTION W/PHACO Right 09/14/2017  ? Procedure: CATARACT EXTRACTION PHACO AND INTRAOCULAR LENS PLACEMENT (IOC);  Surgeon: Eulogio Bear, MD;  Location: [facility];  Service: Ophthalmology;  Laterality: Right;  [place] 00:21.0 ?AP% 4.7 ?CDE 1.00 ?Fluid Pack Lot # [id number]  ? GANGLION CYST EXCISION Right   ? SHOULDER SURGERY Bilateral 1988, 2001  ? TONSILLECTOMY    ? TRIGGER FINGER RELEASE Right   ? ? ?Family  History  ?Problem Relation Age of Onset  ? Hyperlipidemia Mother   ? Arthritis Mother   ? Hypertension Mother   ? Kidney cancer Mother   ? Stroke Father   ?     hemorrhagic  ? Arthritis Father   ? Heart disease Sister   ?     mitral valve repair  ? Hypertension Sister   ? Congestive Heart Failure Sister   ? Diabetes Neg Hx   ? Colon cancer Neg Hx   ? ? ?Social History  ? ?Socioeconomic History  ? Marital status: Single  ?  Spouse name: Not on file  ? Number of children: 0  ? Years of education: Not on file  ? Highest education level: Not on file  ?Occupational History  ? Occupation: Cytogeneticist  ?  Comment: Retired  ?Tobacco Use  ? Smoking status: Never  ?  Passive exposure: Past  ? Smokeless tobacco: Never  ?Vaping Use  ? Vaping Use: Never used  ?Substance and Sexual Activity  ? Alcohol use: No  ? Drug use: No  ? Sexual activity: Not on file  ?Other Topics Concern  ? Not on file  ?Social History Narrative  ? Wants to be called [name]  ? Has living will  ? Niece is her health care POA----Olivia [place] Crane,  ? Requests DNR--done 03/16/21  ? No tube feeds if cognitively unaware  ? ?Social Determinants of Health  ? ?Financial Resource Strain: Not on file  ?Food Insecurity: Not on file  ?Transportation Needs: Not on file  ?Physical Activity: Not on file  ?Stress: Not on file  ?Social Connections: Not on file  ?Intimate Partner Violence: Not on file  ? ?Review of Systems ?No N/V ?No diarrhea---eating bananas/rice/toast/yogurt ? ?   ?Objective:  ? Physical Exam ?Constitutional:   ?   Appearance: Normal appearance.  ?HENT:  ?   Right Ear: Tympanic membrane and ear canal normal.  ?   Ears:  ?   Comments: Left TM is retracted inferiorly with normal light reflex ?No inflammation ?Superiorly it is dull/?some scarring ?Neurological:  ?   Mental Status: She is alert.  ?  ? ? ? ? ?   ?Assessment & Plan:  ? ?

## 2021-09-01 NOTE — Assessment & Plan Note (Signed)
Has cleared up considerably--but not normal yet ?Told her that it might take a while to completely resolve ?If symptoms worsen, will give doxy 100 bid for 7 days ?If not improved in a month---will refer to ENT ?

## 2021-09-11 DIAGNOSIS — Z20822 Contact with and (suspected) exposure to covid-19: Secondary | ICD-10-CM | POA: Diagnosis not present

## 2021-09-13 DIAGNOSIS — Z20822 Contact with and (suspected) exposure to covid-19: Secondary | ICD-10-CM | POA: Diagnosis not present

## 2021-10-05 DIAGNOSIS — Z23 Encounter for immunization: Secondary | ICD-10-CM | POA: Diagnosis not present

## 2021-10-29 ENCOUNTER — Ambulatory Visit (INDEPENDENT_AMBULATORY_CARE_PROVIDER_SITE_OTHER): Payer: Medicare Other | Admitting: Podiatry

## 2021-10-29 ENCOUNTER — Ambulatory Visit (INDEPENDENT_AMBULATORY_CARE_PROVIDER_SITE_OTHER): Payer: Medicare Other

## 2021-10-29 DIAGNOSIS — M79674 Pain in right toe(s): Secondary | ICD-10-CM | POA: Diagnosis not present

## 2021-10-29 DIAGNOSIS — M79675 Pain in left toe(s): Secondary | ICD-10-CM

## 2021-10-29 DIAGNOSIS — B351 Tinea unguium: Secondary | ICD-10-CM | POA: Diagnosis not present

## 2021-12-21 ENCOUNTER — Encounter: Payer: Self-pay | Admitting: Internal Medicine

## 2021-12-21 ENCOUNTER — Ambulatory Visit (INDEPENDENT_AMBULATORY_CARE_PROVIDER_SITE_OTHER)
Admission: RE | Admit: 2021-12-21 | Discharge: 2021-12-21 | Disposition: A | Discharge status: Home / Self Care | Payer: Medicare Other | Source: Ambulatory Visit | Attending: Internal Medicine | Admitting: Internal Medicine

## 2021-12-21 ENCOUNTER — Other Ambulatory Visit: Payer: Self-pay | Admitting: Internal Medicine

## 2021-12-21 ENCOUNTER — Ambulatory Visit (INDEPENDENT_AMBULATORY_CARE_PROVIDER_SITE_OTHER): Payer: Medicare Other | Admitting: Internal Medicine

## 2021-12-21 VITALS — BP 110/68 | HR 94 | Temp 97.9°F | Ht <= 58 in | Wt 141.0 lb

## 2021-12-21 DIAGNOSIS — M79641 Pain in right hand: Secondary | ICD-10-CM

## 2021-12-21 DIAGNOSIS — M546 Pain in thoracic spine: Secondary | ICD-10-CM | POA: Insufficient documentation

## 2021-12-21 DIAGNOSIS — E669 Obesity, unspecified: Secondary | ICD-10-CM | POA: Diagnosis not present

## 2021-12-21 DIAGNOSIS — M79642 Pain in left hand: Secondary | ICD-10-CM | POA: Diagnosis not present

## 2021-12-21 LAB — COMPREHENSIVE METABOLIC PANEL
ALT: 21 U/L (ref 0–35)
AST: 19 U/L (ref 0–37)
Albumin: 4.4 g/dL (ref 3.5–5.2)
Alkaline Phosphatase: 55 U/L (ref 39–117)
BUN: 22 mg/dL (ref 6–23)
CO2: 27 mEq/L (ref 19–32)
Calcium: 9.7 mg/dL (ref 8.4–10.5)
Chloride: 101 mEq/L (ref 96–112)
Creatinine, Ser: 0.69 mg/dL (ref 0.40–1.20)
GFR: 84.19 mL/min (ref >60.00)
Glucose, Bld: 128 mg/dL — ABNORMAL HIGH (ref 70–99)
Potassium: 4.1 mEq/L (ref 3.5–5.1)
Sodium: 136 mEq/L (ref 135–145)
Total Bilirubin: 0.5 mg/dL (ref 0.2–1.2)
Total Protein: 7.8 g/dL (ref 6.0–8.3)

## 2021-12-21 LAB — CBC
HCT: 38.3 % (ref 36.0–46.0)
Hemoglobin: 12.8 g/dL (ref 12.0–15.0)
MCHC: 33.6 g/dL (ref 30.0–36.0)
MCV: 88.3 fl (ref 78.0–100.0)
Platelets: 266 10*3/uL (ref 150.0–400.0)
RBC: 4.33 Mil/uL (ref 3.87–5.11)
RDW: 12.8 % (ref 11.5–15.5)
WBC: 8.1 10*3/uL (ref 4.0–10.5)

## 2021-12-21 LAB — LDL CHOLESTEROL, DIRECT: Direct LDL: 90 mg/dL

## 2021-12-21 LAB — LIPID PANEL
Cholesterol: 198 mg/dL (ref 0–200)
HDL: 40.2 mg/dL (ref >39.00)
Total CHOL/HDL Ratio: 5
Triglycerides: 489 mg/dL — ABNORMAL HIGH (ref 0.0–149.0)

## 2021-12-21 NOTE — Assessment & Plan Note (Signed)
Will recheck labs Referral to Wellness/weight loss clinic

## 2021-12-21 NOTE — Assessment & Plan Note (Signed)
Likely related to weight and mechanical issues with her height Will check x-rays

## 2021-12-21 NOTE — Assessment & Plan Note (Signed)
Pain, decreased function, etc Past surgery Will go ahead with referral

## 2021-12-21 NOTE — Progress Notes (Signed)
Subjective:    Patient ID: Shelby Oliver, female    DOB: 1945-06-10, 76 y.o.   MRN: [id number]  HPI Here due to several concerns--mostly about ongoing weight loss  Had always been "too thin" Has been gaining since menopause--but accelerated with retirement Does have problems since bad esophageal spasm--goes back to 1980's---lived on water and toast due to severe reflux ("starvation diet")  Past Rx for H pylori Injections and dilations in the past (esophageal) High dose PPIs----had trouble and now avoids  Now her weight is affecting her ADLs, etc Limited diet still--fairly spartan (but does eat bread/carbs)  BMI now over 30--startling for her Does add high protein ensure  Having low back with limited walking Some left sciatica Also has bilateral leg pain with walking Thoracic back pain---like leaning over to empty washing machine (that was really bad about 3 years ago)  Can feel more pain by her chronic pectus--when leaning over  Current Outpatient Medications on File Prior to Visit  Medication Sig Dispense Refill   acetaminophen (TYLENOL) 500 MG tablet Take 1,000 mg by mouth every 8 (eight) hours as needed.     atorvastatin (LIPITOR) 10 MG tablet TAKE 1 TABLET(10 MG) BY MOUTH DAILY 90 tablet 3   calcium carbonate (TUMS - DOSED IN MG ELEMENTAL CALCIUM) 500 MG chewable tablet Chew 1 tablet by mouth as needed for indigestion or heartburn.     cetirizine (ZYRTEC) 10 MG tablet Take 10 mg by mouth daily.     cholecalciferol (VITAMIN D) 1000 units tablet Take 5,000 Units by mouth once a week. Sundays     metoprolol succinate (TOPROL-XL) 25 MG 24 hr tablet TAKE 1 TABLET(25 MG) BY MOUTH DAILY 90 tablet 3   montelukast (SINGULAIR) 10 MG tablet TAKE 1 TABLET(10 MG) BY MOUTH DAILY 90 tablet 3   olopatadine (PATANOL) 0.1 % ophthalmic solution Place 1 drop into both eyes 2 (two) times daily as needed.     Polyethyl Glycol-Propyl Glycol (SYSTANE OP) Apply 1 drop to eye as needed.     No  current facility-administered medications on file prior to visit.    Allergies  Allergen Reactions   Azithromycin Shortness Of Breath    SOB abd pain diarrhea   Avelox [Moxifloxacin Hcl In Nacl] Hives   Ciprocin-Fluocin-Procin [Fluocinolone]    Penicillins Hives    Has patient had a PCN reaction causing immediate rash, facial/tongue/throat swelling, SOB or lightheadedness with hypotension: no Has patient had a PCN reaction causing severe rash involving mucus membranes or skin necrosis: no Has patient had a PCN reaction that required hospitalization: no Has patient had a PCN reaction occurring within the last 10 years: no If all of the above answers are "NO", then may proceed with Cephalosporin use.    Quinolones Hives    Past Medical History:  Diagnosis Date   Chronic kidney disease    RENAL INSUFF   Complication of anesthesia    RESPIRATORY PROBLEMS WITH SHOULDER SURGERY   Dysrhythmia    Edema    FEET/ANKLES   Erosive esophagitis    Gallbladder polyp    GERD (gastroesophageal reflux disease)    Heart murmur    Hepatic hemangioma    Hepatitis    A IN PAST   History of hiatal hernia    Hyperlipidemia    Mild intermittent asthma    MVP (mitral valve prolapse)    with slight regurgitation   Orthopnea    Osteoporosis    Proteinuria    Pulmonary nodule 2000's  stable over years--no more testing   Sleep disturbance    chronic   Thyroid nodule     Past Surgical History:  Procedure Laterality Date   APPENDECTOMY     CARPAL TUNNEL RELEASE Bilateral 1987   CATARACT EXTRACTION W/PHACO Left 08/17/2017   Procedure: CATARACT EXTRACTION PHACO AND INTRAOCULAR LENS PLACEMENT ([place]);  Surgeon: Eulogio Bear, MD;  Location: [facility];  Service: Ophthalmology;  Laterality: Left;  [place] 00:29.0 AP% 11.1 CDE 3.21 Fluid Pack lot # [id number]   CATARACT EXTRACTION W/PHACO Right 09/14/2017   Procedure: CATARACT EXTRACTION PHACO AND INTRAOCULAR LENS PLACEMENT (IOC);  Surgeon: Eulogio Bear, MD;  Location: [facility];  Service: Ophthalmology;  Laterality: Right;  [place] 00:21.0 AP% 4.7 CDE 1.00 Fluid Pack Lot # [id number]   GANGLION CYST EXCISION Right    SHOULDER SURGERY Bilateral 1988, 2001   TONSILLECTOMY     TRIGGER FINGER RELEASE Right     Family History  Problem Relation Age of Onset   Hyperlipidemia Mother    Arthritis Mother    Hypertension Mother    Kidney cancer Mother    Stroke Father        hemorrhagic   Arthritis Father    Heart disease Sister        mitral valve repair   Hypertension Sister    Congestive Heart Failure Sister    Diabetes Neg Hx    Colon cancer Neg Hx     Social History   Socioeconomic History   Marital status: Single    Spouse name: Not on file   Number of children: 0   Years of education: Not on file   Highest education level: Not on file  Occupational History   Occupation: Environmental health practitioner of Automotive engineer    Comment: Retired  Tobacco Use   Smoking status: Never    Passive exposure: Past   Smokeless tobacco: Never  Vaping Use   Vaping Use: Never used  Substance and Sexual Activity   Alcohol use: No   Drug use: No   Sexual activity: Not on file  Other Topics Concern   Not on file  Social History Narrative   Wants to be called Shelby Oliver   Has living will   Niece is her health care POA----Shelby Oliver,   Requests DNR--done 03/16/21   No tube feeds if cognitively unaware   Social Determinants of Health   Financial Resource Strain: Not on file  Food Insecurity: Not on file  Transportation Needs: Not on file  Physical Activity: Not on file  Stress: Not on file  Social Connections: Not on file  Intimate Partner Violence: Not on file   Review of Systems Had to go to podiatrist to get toenails trimmed (couldn't reach them well) Gets SOB when bending over--it will cause a cough Not sleeping great    Objective:   Physical Exam Musculoskeletal:     Comments: No thoracic spine tenderness Fair bilateral  external rotation No striking arm weakness  Neurological:     Mental Status: She is alert.  Psychiatric:     Comments: Frustrated but not depressed            Assessment & Plan:

## 2021-12-27 DIAGNOSIS — M65322 Trigger finger, left index finger: Secondary | ICD-10-CM | POA: Diagnosis not present

## 2021-12-27 DIAGNOSIS — M79642 Pain in left hand: Secondary | ICD-10-CM | POA: Diagnosis not present

## 2021-12-27 DIAGNOSIS — M79641 Pain in right hand: Secondary | ICD-10-CM | POA: Diagnosis not present

## 2022-01-11 DIAGNOSIS — M65322 Trigger finger, left index finger: Secondary | ICD-10-CM | POA: Diagnosis not present

## 2022-02-03 ENCOUNTER — Ambulatory Visit (INDEPENDENT_AMBULATORY_CARE_PROVIDER_SITE_OTHER): Payer: Medicare Other | Admitting: Podiatry

## 2022-02-03 ENCOUNTER — Encounter: Payer: Self-pay | Admitting: Podiatry

## 2022-02-03 DIAGNOSIS — M79675 Pain in left toe(s): Secondary | ICD-10-CM | POA: Diagnosis not present

## 2022-02-03 DIAGNOSIS — M79674 Pain in right toe(s): Secondary | ICD-10-CM

## 2022-02-03 DIAGNOSIS — B351 Tinea unguium: Secondary | ICD-10-CM | POA: Diagnosis not present

## 2022-02-03 NOTE — Progress Notes (Signed)
This patient returns to my office for at risk foot care.  This patient requires this care by a professional since this patient will be at risk due to having CKD.  This patient is unable to cut nails herself since the patient cannot reach her nails.These nails are painful walking and wearing shoes.  This patient presents for at risk foot care today.  General Appearance  Alert, conversant and in no acute stress.  Vascular  Dorsalis pedis and posterior tibial  pulses are palpable  bilaterally.  Capillary return is within normal limits  bilaterally. Temperature is within normal limits  bilaterally.  Neurologic  Senn-Weinstein monofilament wire test within normal limits  bilaterally. Muscle power within normal limits bilaterally.  Nails Thick disfigured discolored nails with subungual debris  from hallux to fifth toes bilaterally. No evidence of bacterial infection or drainage bilaterally.  Orthopedic  No limitations of motion  feet .  No crepitus or effusions noted.  No bony pathology or digital deformities noted.  Skin  normotropic skin with no porokeratosis noted bilaterally.  No signs of infections or ulcers noted.     Onychomycosis  Pain in right toes  Pain in left toes  Consent was obtained for treatment procedures.   Mechanical debridement of nails 1-5  bilaterally performed with a nail nipper.  Filed with dremel without incident.    Return office visit     3 months                Told patient to return for periodic foot care and evaluation due to potential at risk complications.   Ellena Kamen DPM   

## 2022-02-22 DIAGNOSIS — Z23 Encounter for immunization: Secondary | ICD-10-CM | POA: Diagnosis not present

## 2022-03-18 DIAGNOSIS — Z23 Encounter for immunization: Secondary | ICD-10-CM | POA: Diagnosis not present

## 2022-03-22 ENCOUNTER — Ambulatory Visit (INDEPENDENT_AMBULATORY_CARE_PROVIDER_SITE_OTHER): Payer: Medicare Other | Admitting: Internal Medicine

## 2022-03-22 ENCOUNTER — Encounter: Payer: Self-pay | Admitting: Internal Medicine

## 2022-03-22 VITALS — BP 118/80 | HR 100 | Temp 98.1°F | Ht <= 58 in | Wt 136.0 lb

## 2022-03-22 DIAGNOSIS — K2210 Ulcer of esophagus without bleeding: Secondary | ICD-10-CM

## 2022-03-22 DIAGNOSIS — Z0000 Encounter for general adult medical examination without abnormal findings: Secondary | ICD-10-CM

## 2022-03-22 DIAGNOSIS — Z1211 Encounter for screening for malignant neoplasm of colon: Secondary | ICD-10-CM | POA: Diagnosis not present

## 2022-03-22 DIAGNOSIS — J4520 Mild intermittent asthma, uncomplicated: Secondary | ICD-10-CM | POA: Diagnosis not present

## 2022-03-22 DIAGNOSIS — I700 Atherosclerosis of aorta: Secondary | ICD-10-CM

## 2022-03-22 DIAGNOSIS — G8929 Other chronic pain: Secondary | ICD-10-CM

## 2022-03-22 DIAGNOSIS — M5441 Lumbago with sciatica, right side: Secondary | ICD-10-CM | POA: Diagnosis not present

## 2022-03-22 DIAGNOSIS — I341 Nonrheumatic mitral (valve) prolapse: Secondary | ICD-10-CM | POA: Diagnosis not present

## 2022-03-22 MED ORDER — MONTELUKAST SODIUM 10 MG PO TABS: ORAL_TABLET | ORAL | 3 refills | Status: DC

## 2022-03-22 MED ORDER — ATORVASTATIN CALCIUM 10 MG PO TABS
ORAL_TABLET | ORAL | 3 refills | Status: DC
Start: 2022-03-22 — End: 2022-09-20

## 2022-03-22 MED ORDER — METOPROLOL SUCCINATE ER 25 MG PO TB24: ORAL_TABLET | ORAL | 3 refills | Status: DC

## 2022-03-22 NOTE — Progress Notes (Signed)
Hearing Screening - Comments:: Passed whisper test Vision Screening - Comments:: March 2023

## 2022-03-22 NOTE — Assessment & Plan Note (Signed)
On imaging Is on atorvastatin '10mg'$  Recent cholesterol levels okay

## 2022-03-22 NOTE — Assessment & Plan Note (Signed)
No murmur today Is on the metoprolol '25mg'$  daily

## 2022-03-22 NOTE — Progress Notes (Signed)
Subjective:    Patient ID: Shelby Oliver, female    DOB: Oct 18, 1945, 76 y.o.   MRN: [id number]  HPI Here for Medicare wellness visit and follow up of chronic medical conditions Reviewed advanced directives Reviewed other doctors---Dr Maklouf--dentist, Dr Kathrine Cords, Dr Alda Berthold surgeon, Dr Heide Scales, Dr Lina Sar, Dr Barbee Cough, Dr Lenis Noon No hospitalizations or surgery this year Not really exercising--tries to walk some. Still has trouble with any bending/leaning over Vision is fine No problems with hearing No alcohol or tobacco No falls Not depressed or anhedonic Independent with instrumental ADLs No sig memory issues  Weight down slightly--but still frustrated and feels bloated and overweight Doesn't eat much The nutrition consultation didn't work out--not eligible Did try adding some protein--has trouble tolerating Trying some ensure---discussed trying just protein drink  Got injection in hand Finger is better now Holding off on further action  Still has radicular pain and numb/burning in left leg Bad with standing Not walking much--this can worsen it  Known aortic atherosclerosis No chest pain  Occasional brief SOB--sitting (better when she sits up) No dizziness or syncope Some edema--wears light support hose No headaches--or just some slight temporal throbbing  Off meds for GERD Variable swallowing---not a big deal lately (or just drinks water and it improves) No recent contraction problems (sensitive to heat though)  Current Outpatient Medications on File Prior to Visit  Medication Sig Dispense Refill   acetaminophen (TYLENOL) 500 MG tablet Take 1,000 mg by mouth every 8 (eight) hours as needed.     atorvastatin (LIPITOR) 10 MG tablet TAKE 1 TABLET(10 MG) BY MOUTH DAILY 90 tablet 3   calcium carbonate (TUMS - DOSED IN MG ELEMENTAL CALCIUM) 500 MG chewable tablet Chew 1 tablet by mouth as needed for indigestion or heartburn.      cetirizine (ZYRTEC) 10 MG tablet Take 10 mg by mouth daily.     cholecalciferol (VITAMIN D) 1000 units tablet Take 5,000 Units by mouth once a week. Sundays     metoprolol succinate (TOPROL-XL) 25 MG 24 hr tablet TAKE 1 TABLET(25 MG) BY MOUTH DAILY 90 tablet 3   montelukast (SINGULAIR) 10 MG tablet TAKE 1 TABLET(10 MG) BY MOUTH DAILY 90 tablet 3   olopatadine (PATANOL) 0.1 % ophthalmic solution Place 1 drop into both eyes 2 (two) times daily as needed.     Polyethyl Glycol-Propyl Glycol (SYSTANE OP) Apply 1 drop to eye as needed.     No current facility-administered medications on file prior to visit.    Allergies  Allergen Reactions   Azithromycin Shortness Of Breath    SOB abd pain diarrhea   Avelox [Moxifloxacin Hcl In Nacl] Hives   Ciprocin-Fluocin-Procin [Fluocinolone]    Penicillins Hives    Has patient had a PCN reaction causing immediate rash, facial/tongue/throat swelling, SOB or lightheadedness with hypotension: no Has patient had a PCN reaction causing severe rash involving mucus membranes or skin necrosis: no Has patient had a PCN reaction that required hospitalization: no Has patient had a PCN reaction occurring within the last 10 years: no If all of the above answers are "NO", then may proceed with Cephalosporin use.    Quinolones Hives    Past Medical History:  Diagnosis Date   Chronic kidney disease    RENAL INSUFF   Complication of anesthesia    RESPIRATORY PROBLEMS WITH SHOULDER SURGERY   Dysrhythmia    Edema    FEET/ANKLES   Erosive esophagitis    Gallbladder polyp    GERD (gastroesophageal reflux disease)  Heart murmur    Hepatic hemangioma    Hepatitis    A IN PAST   History of hiatal hernia    Hyperlipidemia    Mild intermittent asthma    MVP (mitral valve prolapse)    with slight regurgitation   Orthopnea    Osteoporosis    Proteinuria    Pulmonary nodule 2000's   stable over years--no more testing   Sleep disturbance    chronic   Thyroid  nodule     Past Surgical History:  Procedure Laterality Date   APPENDECTOMY     CARPAL TUNNEL RELEASE Bilateral 1987   CATARACT EXTRACTION W/PHACO Left 08/17/2017   Procedure: CATARACT EXTRACTION PHACO AND INTRAOCULAR LENS PLACEMENT ([place]);  Surgeon: Eulogio Bear, MD;  Location: [facility];  Service: Ophthalmology;  Laterality: Left;  [place] 00:29.0 AP% 11.1 CDE 3.21 Fluid Pack lot # [id number]   CATARACT EXTRACTION W/PHACO Right 09/14/2017   Procedure: CATARACT EXTRACTION PHACO AND INTRAOCULAR LENS PLACEMENT ([facility]);  Surgeon: Eulogio Bear, MD;  Location: [facility];  Service: Ophthalmology;  Laterality: Right;  [place] 00:21.0 AP% 4.7 CDE 1.00 Fluid Pack Lot # [id number]   GANGLION CYST EXCISION Right    SHOULDER SURGERY Bilateral 1988, 2001   TONSILLECTOMY     TRIGGER FINGER RELEASE Right     Family History  Problem Relation Age of Onset   Hyperlipidemia Mother    Arthritis Mother    Hypertension Mother    Kidney cancer Mother    Stroke Father        hemorrhagic   Arthritis Father    Heart disease Sister        mitral valve repair   Hypertension Sister    Congestive Heart Failure Sister    Diabetes Neg Hx    Colon cancer Neg Hx     Social History   Socioeconomic History   Marital status: Single    Spouse name: Not on file   Number of children: 0   Years of education: Not on file   Highest education level: Not on file  Occupational History   Occupation: Environmental health practitioner of Automotive engineer    Comment: Retired  Tobacco Use   Smoking status: Never    Passive exposure: Past   Smokeless tobacco: Never  Vaping Use   Vaping Use: Never used  Substance and Sexual Activity   Alcohol use: No   Drug use: No   Sexual activity: Not on file  Other Topics Concern   Not on file  Social History Narrative   Wants to be called Shelby Oliver   Has living will   Niece is her health care POA----Shelby Oliver,   Requests DNR--done 03/16/21   No tube feeds if cognitively unaware   Social  Determinants of Health   Financial Resource Strain: Not on file  Food Insecurity: Not on file  Transportation Needs: Not on file  Physical Activity: Not on file  Stress: Not on file  Social Connections: Not on file  Intimate Partner Violence: Not on file   Review of Systems Chronic sleep issues--initiates fine but then awakening Wears seat belt Teeth okay--sees dentist Bowels okay      Objective:   Physical Exam Constitutional:      Appearance: Normal appearance.  HENT:     Mouth/Throat:     Comments: No lesions Eyes:     Conjunctiva/sclera: Conjunctivae normal.     Pupils: Pupils are equal, round, and reactive to light.  Cardiovascular:  Rate and Rhythm: Normal rate and regular rhythm.     Pulses: Normal pulses.     Heart sounds: No murmur heard.    No gallop.  Pulmonary:     Effort: Pulmonary effort is normal.     Breath sounds: Normal breath sounds. No wheezing or rales.  Abdominal:     Palpations: Abdomen is soft.     Tenderness: There is no abdominal tenderness.  Musculoskeletal:     Cervical back: Neck supple.     Right lower leg: No edema.     Left lower leg: No edema.  Lymphadenopathy:     Cervical: No cervical adenopathy.  Skin:    Findings: No lesion or rash.  Neurological:     General: No focal deficit present.     Mental Status: She is alert and oriented to person, place, and time.     Comments: Mini-cog normal  Psychiatric:        Mood and Affect: Mood normal.        Behavior: Behavior normal.            Assessment & Plan:

## 2022-03-22 NOTE — Assessment & Plan Note (Signed)
Uses tylenol '1000mg'$  once or twice a day

## 2022-03-22 NOTE — Assessment & Plan Note (Signed)
No sig symptoms with the montelukast

## 2022-03-22 NOTE — Assessment & Plan Note (Signed)
Persistent stable symptoms She doesn't do well with meds

## 2022-03-22 NOTE — Assessment & Plan Note (Signed)
I have personally reviewed the Medicare Annual Wellness questionnaire and have noted 1. The patient's medical and social history 2. Their use of alcohol, tobacco or illicit drugs 3. Their current medications and supplements 4. The patient's functional ability including ADL's, fall risks, home safety risks and hearing or visual             impairment. 5. Diet and physical activities 6. Evidence for depression or mood disorders  The patients weight, height, BMI and visual acuity have been recorded in the chart I have made referrals, counseling and provided education to the patient based review of the above and I have provided the pt with a written personalized care plan for preventive services.  I have provided you with a copy of your personalized plan for preventive services. Please take the time to review along with your updated medication list.  Still prefers no mammogram Will check FIT again She tries to walk--is limited Had flu and updated COVID vaccines already

## 2022-04-25 ENCOUNTER — Other Ambulatory Visit: Payer: Self-pay

## 2022-04-25 DIAGNOSIS — Z1211 Encounter for screening for malignant neoplasm of colon: Secondary | ICD-10-CM

## 2022-04-27 LAB — FECAL OCCULT BLOOD, IMMUNOCHEMICAL: Fecal Occult Bld: NEGATIVE

## 2022-05-05 ENCOUNTER — Encounter: Payer: Self-pay | Admitting: Podiatry

## 2022-05-05 ENCOUNTER — Ambulatory Visit (INDEPENDENT_AMBULATORY_CARE_PROVIDER_SITE_OTHER): Payer: Medicare Other | Admitting: Podiatry

## 2022-05-05 DIAGNOSIS — M79674 Pain in right toe(s): Secondary | ICD-10-CM | POA: Diagnosis not present

## 2022-05-05 DIAGNOSIS — B351 Tinea unguium: Secondary | ICD-10-CM

## 2022-05-05 DIAGNOSIS — M79675 Pain in left toe(s): Secondary | ICD-10-CM

## 2022-05-05 NOTE — Progress Notes (Signed)
This patient returns to my office for at risk foot care.  This patient requires this care by a professional since this patient will be at risk due to having CKD.  This patient is unable to cut nails herself since the patient cannot reach her nails.These nails are painful walking and wearing shoes.  This patient presents for at risk foot care today.  General Appearance  Alert, conversant and in no acute stress.  Vascular  Dorsalis pedis and posterior tibial  pulses are palpable  bilaterally.  Capillary return is within normal limits  bilaterally. Temperature is within normal limits  bilaterally.  Neurologic  Senn-Weinstein monofilament wire test within normal limits  bilaterally. Muscle power within normal limits bilaterally.  Nails Thick disfigured discolored nails with subungual debris  from hallux to fifth toes bilaterally. No evidence of bacterial infection or drainage bilaterally.  Orthopedic  No limitations of motion  feet .  No crepitus or effusions noted.  No bony pathology or digital deformities noted.  Skin  normotropic skin with no porokeratosis noted bilaterally.  No signs of infections or ulcers noted.     Onychomycosis  Pain in right toes  Pain in left toes  Consent was obtained for treatment procedures.   Mechanical debridement of nails 1-5  bilaterally performed with a nail nipper.  Filed with dremel without incident.    Return office visit     3 months                Told patient to return for periodic foot care and evaluation due to potential at risk complications.   Gardiner Barefoot DPM

## 2022-07-21 DIAGNOSIS — H1013 Acute atopic conjunctivitis, bilateral: Secondary | ICD-10-CM | POA: Diagnosis not present

## 2022-08-04 ENCOUNTER — Encounter: Payer: Self-pay | Admitting: Podiatry

## 2022-08-04 ENCOUNTER — Ambulatory Visit: Payer: PPO | Admitting: Podiatry

## 2022-08-04 DIAGNOSIS — B351 Tinea unguium: Secondary | ICD-10-CM | POA: Diagnosis not present

## 2022-08-04 DIAGNOSIS — M79675 Pain in left toe(s): Secondary | ICD-10-CM

## 2022-08-04 DIAGNOSIS — M79674 Pain in right toe(s): Secondary | ICD-10-CM | POA: Diagnosis not present

## 2022-08-04 NOTE — Progress Notes (Signed)
This patient returns to my office for at risk foot care.  This patient requires this care by a professional since this patient will be at risk due to having CKD.  This patient is unable to cut nails herself since the patient cannot reach her nails.These nails are painful walking and wearing shoes.  This patient presents for at risk foot care today.  General Appearance  Alert, conversant and in no acute stress.  Vascular  Dorsalis pedis and posterior tibial  pulses are palpable  bilaterally.  Capillary return is within normal limits  bilaterally. Temperature is within normal limits  bilaterally.  Neurologic  Senn-Weinstein monofilament wire test within normal limits  bilaterally. Muscle power within normal limits bilaterally.  Nails Thick disfigured discolored nails with subungual debris  from hallux to fifth toes bilaterally. No evidence of bacterial infection or drainage bilaterally.  Orthopedic  No limitations of motion  feet .  No crepitus or effusions noted.  No bony pathology or digital deformities noted.  Skin  normotropic skin with no porokeratosis noted bilaterally.  No signs of infections or ulcers noted.     Onychomycosis  Pain in right toes  Pain in left toes  Consent was obtained for treatment procedures.   Mechanical debridement of nails 1-5  bilaterally performed with a nail nipper.  Filed with dremel without incident.    Return office visit     3 months                Told patient to return for periodic foot care and evaluation due to potential at risk complications.   Tyaire Odem DPM   

## 2022-08-15 ENCOUNTER — Encounter: Payer: Self-pay | Admitting: Student

## 2022-08-15 ENCOUNTER — Ambulatory Visit: Payer: PPO | Admitting: Student

## 2022-08-15 VITALS — BP 126/84 | HR 90 | Temp 98.3°F | Ht <= 58 in | Wt 139.0 lb

## 2022-08-15 DIAGNOSIS — E785 Hyperlipidemia, unspecified: Secondary | ICD-10-CM

## 2022-08-15 DIAGNOSIS — J4520 Mild intermittent asthma, uncomplicated: Secondary | ICD-10-CM

## 2022-08-15 DIAGNOSIS — G8929 Other chronic pain: Secondary | ICD-10-CM

## 2022-08-15 DIAGNOSIS — M810 Age-related osteoporosis without current pathological fracture: Secondary | ICD-10-CM

## 2022-08-15 DIAGNOSIS — G479 Sleep disorder, unspecified: Secondary | ICD-10-CM | POA: Diagnosis not present

## 2022-08-15 DIAGNOSIS — E669 Obesity, unspecified: Secondary | ICD-10-CM | POA: Diagnosis not present

## 2022-08-15 DIAGNOSIS — K2210 Ulcer of esophagus without bleeding: Secondary | ICD-10-CM

## 2022-08-15 DIAGNOSIS — I700 Atherosclerosis of aorta: Secondary | ICD-10-CM | POA: Diagnosis not present

## 2022-08-15 DIAGNOSIS — M549 Dorsalgia, unspecified: Secondary | ICD-10-CM | POA: Diagnosis not present

## 2022-08-15 DIAGNOSIS — E041 Nontoxic single thyroid nodule: Secondary | ICD-10-CM | POA: Diagnosis not present

## 2022-08-15 NOTE — Progress Notes (Signed)
Location:  [facility].   Provider: Dr. Earnestine Mealing  Code Status: DNR Goals of Care:     08/15/2022   12:42 PM  Advanced Directives  Does Patient Have a Medical Advance Directive? Yes  Type of Estate agent of [place];Out of facility DNR (pink MOST or yellow form);Living will  Does patient want to make changes to medical advance directive? No - Patient declined  Copy of Healthcare Power of Attorney in Chart? Yes - validated most recent copy scanned in chart (See row information)     Chief Complaint  Patient presents with  . Establish Care    Establish Care  . Quality Metric Gaps    To discuss need for Hepatitis C Screening.     HPI: Patient is a 77 y.o. female seen today to Establish  Weight -- she has been overweight since menopause. She is twice the weight she doesn't like chicken, meat, or eggs. She doesn't like the ensure either.   High Cholesterol -- has been elevated since menopause.   Acid Reflux - She cannot tolerate spices or most types of meat-based protein.   Sleep - She has had issues with sleeping in the bed. 4 years in the recliner. She goes to sleep then wakes up 12-3AM and sleeps on and off. She has really bad sleepness night. She often eats something sugary.   Thoracic back pain - makes it difficult to go up and down stairs. 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 She was never flexible, but she has been so tight lately she can't perform personal hygiene and. Her stomach is in the way.   Neuralgia paresthetica -- has improved but it has been a part of her life for a long time.   Medications-  Metamucil - she only drinks 3 per day. Sometimes she doesn't eat enough and gets a constipated.   Dexa- has osteoporosis since her 75s. She couldn't take meds because of her. She was previously on risedronate  and then changed to alendronate, but hasn't had any treatment in the last 9 years.   Asthma=  No albuterol for years. She was previously on Advair. She is on singulair as well.   Exercise - She used to walk. She feels worse when she is walking. She has done physical therapy, but it stopped after the pandemic.   Mentation - Slower at [employer], and names. She is able to do things independently.   She has never had a fall and she is very careful. She wants to stay independent. Most things aren't accessible to her.   She is [place], born and raised in [place]. They came in 1955. Her father's brother had to sponsor them in [place]. She had an uncle in [place] and she moved here 9 years ago. She has a sister here. She also has a brother and sister in [place]. 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  Past Medical History:  Diagnosis Date  . Chronic kidney disease    RENAL INSUFF  . Complication of anesthesia    RESPIRATORY PROBLEMS WITH SHOULDER SURGERY  . Dysrhythmia   . Edema    FEET/ANKLES  . Erosive esophagitis   . Gallbladder polyp   . GERD (gastroesophageal reflux disease)   . Heart murmur   . Hepatic hemangioma   . Hepatitis  A IN PAST  . History of hiatal hernia   . Hyperlipidemia   . Mild intermittent asthma   . MVP (mitral valve prolapse)    with slight regurgitation  . Orthopnea   . Osteoporosis   . Proteinuria   . Pulmonary nodule 2000's   stable over years--no more testing  . Sleep disturbance    chronic  . Thyroid nodule     Past Surgical History:  Procedure Laterality Date  . APPENDECTOMY    . CARPAL TUNNEL RELEASE Bilateral 1987  . CATARACT EXTRACTION W/PHACO Left 08/17/2017   Procedure: CATARACT EXTRACTION PHACO AND INTRAOCULAR LENS PLACEMENT ([facility]);  Surgeon: Nevada Crane, MD;  Location: [facility];  Service: Ophthalmology;  Laterality: Left;  [place] 00:29.0 AP% 11.1 CDE 3.21 Fluid Pack lot # [id number]  . CATARACT  EXTRACTION W/PHACO Right 09/14/2017   Procedure: CATARACT EXTRACTION PHACO AND INTRAOCULAR LENS PLACEMENT ([facility]);  Surgeon: Nevada Crane, MD;  Location: [facility] OR[facility];  Service: Ophthalmology;  Laterality: Right;  [place] 00:21.0 AP% 4.7 CDE 1.00 Fluid Pack Lot # [id number]  . GANGLION CYST EXCISION Right   . SHOULDER SURGERY Bilateral 1988, 2001  . TONSILLECTOMY    . TRIGGER FINGER RELEASE Right     Allergies  Allergen Reactions  . Azithromycin Shortness Of Breath    SOB abd pain diarrhea  . Avelox [Moxifloxacin Hcl In Nacl] Hives  . Ciprocin-Fluocin-Procin [Fluocinolone]   . Penicillins Hives    Has patient had a PCN reaction causing immediate rash, facial/tongue/throat swelling, SOB or lightheadedness with hypotension: no Has patient had a PCN reaction causing severe rash involving mucus membranes or skin necrosis: no Has patient had a PCN reaction that required hospitalization: no Has patient had a PCN reaction occurring within the last 10 years: no If all of the above answers are "NO", then may proceed with Cephalosporin use.   . Quinolones Hives    Outpatient Encounter Medications as of 08/15/2022  Medication Sig  . acetaminophen (TYLENOL) 500 MG tablet Take 1,000 mg by mouth every 8 (eight) hours as needed.  Marland Kitchen atorvastatin (LIPITOR) 10 MG tablet TAKE 1 TABLET(10 MG) BY MOUTH DAILY  . calcium carbonate (TUMS - DOSED IN MG ELEMENTAL CALCIUM) 500 MG chewable tablet Chew 1 tablet by mouth as needed for indigestion or heartburn.  . cetirizine (ZYRTEC) 10 MG tablet Take 10 mg by mouth daily.  . cholecalciferol (VITAMIN D) 1000 units tablet Take 3,000 Units by mouth daily. Sundays  . metoprolol succinate (TOPROL-XL) 25 MG 24 hr tablet TAKE 1 TABLET(25 MG) BY MOUTH DAILY  . montelukast (SINGULAIR) 10 MG tablet TAKE 1 TABLET(10 MG) BY MOUTH DAILY  . Olopatadine HCl 0.7 % SOLN Place 1 drop into both eyes daily.  Bertram Gala Glycol-Propyl Glycol (SYSTANE OP) Apply 1 drop to eye as needed.    No facility-administered encounter medications on file as of 08/15/2022.    Review of Systems:  Review of Systems  Health Maintenance  Topic Date Due  . Hepatitis C Screening  Never done  . COVID-19 Vaccine (6 - 2023-24 season) 01/07/2022  . INFLUENZA VACCINE  12/08/2022  . Medicare Annual Wellness (AWV)  03/23/2023  . DTaP/Tdap/Td (3 - Tdap) 02/17/2025  . Pneumonia Vaccine 46+ Years old  Completed  . DEXA SCAN  Completed  . Zoster Vaccines- Shingrix  Completed  . HPV VACCINES  Aged Out  . COLONOSCOPY (Pts 45-26yrs Insurance coverage will need to be confirmed)  Discontinued    Physical Exam: Vitals:  08/15/22 1244  BP: 126/84  Pulse: 90  Temp: 98.3 F (36.8 C)  SpO2: 96%  Weight: 139 lb (63 kg)  Height: 4' 5.5" (1.359 m)   Body mass index is 34.14 kg/m. Physical Exam  Labs reviewed: Basic Metabolic Panel: Recent Labs    12/21/21 0956  NA 136  K 4.1  CL 101  CO2 27  GLUCOSE 128*  BUN 22  CREATININE 0.69  CALCIUM 9.7   Liver Function Tests: Recent Labs    12/21/21 0956  AST 19  ALT 21  ALKPHOS 55  BILITOT 0.5  PROT 7.8  ALBUMIN 4.4   No results for input(s): "LIPASE", "AMYLASE" in the last 8760 hours. No results for input(s): "AMMONIA" in the last 8760 hours. CBC: Recent Labs    12/21/21 0956  WBC 8.1  HGB 12.8  HCT 38.3  MCV 88.3  PLT 266.0   Lipid Panel: Recent Labs    12/21/21 0956  CHOL 198  HDL 40.20  TRIG 489.0 Triglyceride is over 400; calculations on Lipids are invalid.*  CHOLHDL 5  LDLDIRECT 90.0   No results found for: "HGBA1C"  Procedures since last visit: No results found.  Assessment/Plan There are no diagnoses linked to this encounter.   Labs/tests ordered:  * No order type specified * Next appt:  Visit date not found

## 2022-08-15 NOTE — Patient Instructions (Addendum)
Nice seeing you today!   Please contact the dermatologist so you can get an evaluation for your skin findings.   You should get a call in the next week regarding physical therapy, if not, please let us know so we can contact the therapy group on campus!

## 2022-08-29 DIAGNOSIS — M6281 Muscle weakness (generalized): Secondary | ICD-10-CM | POA: Diagnosis not present

## 2022-08-29 DIAGNOSIS — G8929 Other chronic pain: Secondary | ICD-10-CM | POA: Diagnosis not present

## 2022-08-29 DIAGNOSIS — M549 Dorsalgia, unspecified: Secondary | ICD-10-CM | POA: Diagnosis not present

## 2022-09-12 DIAGNOSIS — E041 Nontoxic single thyroid nodule: Secondary | ICD-10-CM | POA: Diagnosis not present

## 2022-09-12 DIAGNOSIS — E559 Vitamin D deficiency, unspecified: Secondary | ICD-10-CM | POA: Diagnosis not present

## 2022-09-12 DIAGNOSIS — E119 Type 2 diabetes mellitus without complications: Secondary | ICD-10-CM | POA: Diagnosis not present

## 2022-09-12 DIAGNOSIS — M810 Age-related osteoporosis without current pathological fracture: Secondary | ICD-10-CM | POA: Diagnosis not present

## 2022-09-12 DIAGNOSIS — E785 Hyperlipidemia, unspecified: Secondary | ICD-10-CM | POA: Diagnosis not present

## 2022-09-12 LAB — COMPREHENSIVE METABOLIC PANEL
Albumin: 4.4 (ref 3.5–5.0)
Calcium: 9.6 (ref 8.7–10.7)
Globulin: 3.3
eGFR: 90

## 2022-09-12 LAB — BASIC METABOLIC PANEL
BUN: 26 — AB (ref 4–21)
CO2: 28 — AB (ref 13–22)
Chloride: 102 (ref 99–108)
Creatinine: 0.7 (ref 0.5–1.1)
Glucose: 104
Potassium: 4.2 mEq/L (ref 3.5–5.1)
Sodium: 139 (ref 137–147)

## 2022-09-12 LAB — TSH: TSH: 3.42 (ref 0.41–5.90)

## 2022-09-12 LAB — CBC AND DIFFERENTIAL
HCT: 38 (ref 36–46)
Hemoglobin: 12.7 (ref 12.0–16.0)
Neutrophils Absolute: 4250
Platelets: 256 10*3/uL (ref 150–400)
WBC: 6.4

## 2022-09-12 LAB — HEPATIC FUNCTION PANEL
ALT: 11 U/L (ref 7–35)
AST: 13 (ref 13–35)
Alkaline Phosphatase: 64 (ref 25–125)
Bilirubin, Total: 0.6

## 2022-09-12 LAB — CBC: RBC: 4.35 (ref 3.87–5.11)

## 2022-09-12 LAB — VITAMIN D 25 HYDROXY (VIT D DEFICIENCY, FRACTURES): Vit D, 25-Hydroxy: 40

## 2022-09-12 LAB — VITAMIN B12: Vitamin B-12: 543

## 2022-09-12 LAB — LIPID PANEL
Cholesterol: 208 — AB (ref 0–200)
HDL: 43 (ref 35–70)
LDL Cholesterol: 116
Triglycerides: 340 — AB (ref 40–160)

## 2022-09-12 LAB — HEMOGLOBIN A1C: Hemoglobin A1C: 6.4

## 2022-09-13 DIAGNOSIS — M549 Dorsalgia, unspecified: Secondary | ICD-10-CM | POA: Diagnosis not present

## 2022-09-13 DIAGNOSIS — G8929 Other chronic pain: Secondary | ICD-10-CM | POA: Diagnosis not present

## 2022-09-13 DIAGNOSIS — M6281 Muscle weakness (generalized): Secondary | ICD-10-CM | POA: Diagnosis not present

## 2022-09-14 ENCOUNTER — Ambulatory Visit: Payer: PPO | Admitting: Student

## 2022-09-14 ENCOUNTER — Encounter: Payer: Self-pay | Admitting: Internal Medicine

## 2022-09-14 ENCOUNTER — Encounter: Payer: Self-pay | Admitting: Student

## 2022-09-14 VITALS — BP 132/84 | HR 91 | Temp 98.2°F | Ht <= 58 in | Wt 139.0 lb

## 2022-09-14 DIAGNOSIS — K2210 Ulcer of esophagus without bleeding: Secondary | ICD-10-CM

## 2022-09-14 DIAGNOSIS — K449 Diaphragmatic hernia without obstruction or gangrene: Secondary | ICD-10-CM

## 2022-09-14 DIAGNOSIS — M461 Sacroiliitis, not elsewhere classified: Secondary | ICD-10-CM | POA: Diagnosis not present

## 2022-09-14 DIAGNOSIS — R1011 Right upper quadrant pain: Secondary | ICD-10-CM | POA: Diagnosis not present

## 2022-09-14 DIAGNOSIS — E669 Obesity, unspecified: Secondary | ICD-10-CM

## 2022-09-14 NOTE — Patient Instructions (Addendum)
Consider purchasing Shelby Oliver    You should get a call in the next 48 hours for an appointment with GI. If you don't hear back by Friday, consider calling them to schedule an appointment with Dr. Florence Canner [facility] [address] [place] [address] [place], [place] [address] [phone]

## 2022-09-14 NOTE — Progress Notes (Signed)
Location:  [facility] clinic [facility]  Provider: Dr. Earnestine Mealing  Code Status: DNR Goals of Care:     09/14/2022    1:07 PM  Advanced Directives  Does Patient Have a Medical Advance Directive? Yes  Type of Estate agent of [place];Living will;Out of facility DNR (pink MOST or yellow form)  Does patient want to make changes to medical advance directive? No - Patient declined  Copy of Healthcare Power of Attorney in Chart? Yes - validated most recent copy scanned in chart (See row information)     Chief Complaint  Patient presents with   Medical Management of Chronic Issues    Medical Management of Chronic Issues. 1 Month Follow up    HPI: Patient is a 77 y.o. female seen today for medical management of chronic diseases.    Labs have not resulted yet.    She has been in physical therapy for 2 weeks. Shelby Oliver is "very nice." They have done a lot of stretching to help with the upper back. That helps with physical therapy. She continues to have some neck pain. She has pain when she does things repeatedly.    The right hip starts to bother her significantly. Years ago she had "cysts" which led to symptoms of stenosis on her MRI. She was getting epidurals at that time. (Noted on MRI from 2013). This was back in 2014.   She used to walk 4 hours a day but she started to have pain-- before the pandemic.    Hx of hiatal hernia- she doesn't always have an issue, but it's in the right upper quadrant and it's as if someone has punched her in the right upper quadrant. She has had ultrasounds of the liver before and she had hemangiomas in the liver. Sometimes she can feel it when she is bending over or shopping she had pain in RUQ pain. It's been more persistent. Denies nausea or vomiting. Has had normal bowel movements as well. Metamucil is not every day.   Past Medical History:  Diagnosis Date   Chronic kidney disease    RENAL INSUFF   Complication of anesthesia     RESPIRATORY PROBLEMS WITH SHOULDER SURGERY   Dysrhythmia    Edema    FEET/ANKLES   Erosive esophagitis    Gallbladder polyp    GERD (gastroesophageal reflux disease)    Heart murmur    Hepatic hemangioma    Hepatitis    A IN PAST   History of hiatal hernia    Hyperlipidemia    Mild intermittent asthma    MVP (mitral valve prolapse)    with slight regurgitation   Orthopnea    Osteoporosis    Proteinuria    Pulmonary nodule 2000's   stable over years--no more testing   Sleep disturbance    chronic   Thyroid nodule     Past Surgical History:  Procedure Laterality Date   APPENDECTOMY     CARPAL TUNNEL RELEASE Bilateral 1987   CATARACT EXTRACTION W/PHACO Left 08/17/2017   Procedure: CATARACT EXTRACTION PHACO AND INTRAOCULAR LENS PLACEMENT (IOC);  Surgeon: Nevada Crane, MD;  Location: [facility] ORS;  Service: Ophthalmology;  Laterality: Left;  [place] 00:29.0 AP% 11.1 CDE 3.21 Fluid Pack lot # [id number]   CATARACT EXTRACTION W/PHACO Right 09/14/2017   Procedure: CATARACT EXTRACTION PHACO AND INTRAOCULAR LENS PLACEMENT (IOC);  Surgeon: Nevada Crane, MD;  Location: [facility] ORS;  Service: Ophthalmology;  Laterality: Right;  [place] 00:21.0 AP% 4.7 CDE 1.00  Fluid Pack Lot # [id number]   GANGLION CYST EXCISION Right    SHOULDER SURGERY Bilateral 1988, 2001   TONSILLECTOMY     TRIGGER FINGER RELEASE Right     Allergies  Allergen Reactions   Azithromycin Shortness Of Breath    SOB abd pain diarrhea   Avelox [Moxifloxacin Hcl In Nacl] Hives   Ciprocin-Fluocin-Procin [Fluocinolone]    Penicillins Hives    Has patient had a PCN reaction causing immediate rash, facial/tongue/throat swelling, SOB or lightheadedness with hypotension: no Has patient had a PCN reaction causing severe rash involving mucus membranes or skin necrosis: no Has patient had a PCN reaction that required hospitalization: no Has patient had a PCN reaction occurring within the last 10 years: no If all of the above  answers are "NO", then may proceed with Cephalosporin use.    Quinolones Hives    Outpatient Encounter Medications as of 09/14/2022  Medication Sig   acetaminophen (TYLENOL) 500 MG tablet Take 1,000 mg by mouth every 8 (eight) hours as needed.   atorvastatin (LIPITOR) 10 MG tablet TAKE 1 TABLET(10 MG) BY MOUTH DAILY   cetirizine (ZYRTEC) 10 MG tablet Take 10 mg by mouth daily.   cholecalciferol (VITAMIN D) 1000 units tablet Take 3,000 Units by mouth daily. Sundays   metoprolol succinate (TOPROL-XL) 25 MG 24 hr tablet TAKE 1 TABLET(25 MG) BY MOUTH DAILY   montelukast (SINGULAIR) 10 MG tablet TAKE 1 TABLET(10 MG) BY MOUTH DAILY   Olopatadine HCl 0.7 % SOLN Place 1 drop into both eyes daily.   Polyethyl Glycol-Propyl Glycol (SYSTANE OP) Apply 1 drop to eye as needed.   [DISCONTINUED] calcium carbonate (TUMS - DOSED IN MG ELEMENTAL CALCIUM) 500 MG chewable tablet Chew 1 tablet by mouth as needed for indigestion or heartburn.   No facility-administered encounter medications on file as of 09/14/2022.    Review of Systems:  Review of Systems  Health Maintenance  Topic Date Due   Hepatitis C Screening  Never done   INFLUENZA VACCINE  12/08/2022   Medicare Annual Wellness (AWV)  03/23/2023   DTaP/Tdap/Td (3 - Tdap) 02/17/2025   Pneumonia Vaccine 22+ Years old  Completed   DEXA SCAN  Completed   COVID-19 Vaccine  Completed   Zoster Vaccines- Shingrix  Completed   HPV VACCINES  Aged Out   COLONOSCOPY (Pts 45-43yrs Insurance coverage will need to be confirmed)  Discontinued    Physical Exam: Vitals:   09/14/22 1303  BP: 132/84  Pulse: 91  Temp: 98.2 F (36.8 C)  SpO2: 96%  Weight: 139 lb (63 kg)  Height: 4' 5.5" (1.359 m)   Body mass index is 34.14 kg/m. Physical Exam Constitutional:      Appearance: She is obese.  Cardiovascular:     Rate and Rhythm: Normal rate and regular rhythm.  Pulmonary:     Effort: Pulmonary effort is normal.  Abdominal:     General: Abdomen is flat.  Bowel sounds are normal.     Comments: RUQ tenderness.   Musculoskeletal:        General: Normal range of motion.     Comments: SI joint TTP. Full rom and internal and external rotation of the hip.   Neurological:     Mental Status: She is alert and oriented to person, place, and time.     Labs reviewed: Basic Metabolic Panel: Recent Labs    12/21/21 0956  NA 136  K 4.1  CL 101  CO2 27  GLUCOSE 128*  BUN  22  CREATININE 0.69  CALCIUM 9.7   Liver Function Tests: Recent Labs    12/21/21 0956  AST 19  ALT 21  ALKPHOS 55  BILITOT 0.5  PROT 7.8  ALBUMIN 4.4   No results for input(s): "LIPASE", "AMYLASE" in the last 8760 hours. No results for input(s): "AMMONIA" in the last 8760 hours. CBC: Recent Labs    12/21/21 0956  WBC 8.1  HGB 12.8  HCT 38.3  MCV 88.3  PLT 266.0   Lipid Panel: Recent Labs    12/21/21 0956  CHOL 198  HDL 40.20  TRIG 489.0 Triglyceride is over 400; calculations on Lipids are invalid.*  CHOLHDL 5  LDLDIRECT 90.0   No results found for: "HGBA1C"  Procedures since last visit: No results found.  Assessment/Plan RUQ pain - Plan: US Abdomen Limited RUQ (LIVER/GB)  Erosive esophagitis - Plan: Ambulatory referral to Gastroenterology  Sacroiliitis (HCC)  Obesity (BMI 30-39.9)  Hiatal hernia Patient with RUQ pain. Unclear etiology. High suspicion for referred pain from erosive esophagitis, however, some concern for liver pathology. Will plan for URQ ultrasound. Patient also with continued weight gain in the last month. Labs collected and within 24 hours of her appointment resulted showing elevated cholesterol - LDL and triglycerides. Plan to increase atorvastatin to 20 mg daily. Also, PreDM with A1c 6.4. We have had significant discussions regarding her diet and exercise, however, these laboratory findings suggest there are possible dietary changes that could help her wit her weight goals. Continued pain in the sacrum likely worsening in the  setting of weight gain. Discussed physical therapy and/or a second opinon. Patient plans to start salonpas patches to see how this helps.    Labs/tests ordered:  * No order type specified * Next appt:  Visit date not found  I spent greater than 30 minutes for the care of this patient in face to face time, chart review, clinical documentation, patient education.

## 2022-09-19 ENCOUNTER — Telehealth: Payer: Medicare Other | Admitting: Student

## 2022-09-19 NOTE — Telephone Encounter (Signed)
Please contact patient to inform her that her cholesterol and A1c were elevated. Your A1c was 6.4 Your labs show that you have pre-diabetes (or early diabetes). Diabetes is diagnosed if your A1c (which is a three month average of your blood sugars) is 6.5 or more. Cut back on sugars and processed foods (for example, white bread, white rice, white tortillas, canned foods, desserts, pasta, potatoes), and start eating more fibers, veggies, fruits, healthy fats. Trying not to sit for longer than an hour at a time (just get up and walk around every hour) will help too. 30 minutes of brisk exercise (for example, walking at a fast pace) 5-6 times a week will make a big difference too. This should also help with your cholesterol. This can be discussed further at your follow up. At this time based on your cholesterol level is is recommended that we increase your lipitor to 20 mg daily. We can recheck both of these values in 6 months.

## 2022-09-19 NOTE — Telephone Encounter (Signed)
-----   Message from Elveria Royals, CMA sent at 09/15/2022  3:54 PM EDT ----- abstracted

## 2022-09-20 MED ORDER — ATORVASTATIN CALCIUM 20 MG PO TABS: ORAL_TABLET | ORAL | 1 refills | Status: DC

## 2022-09-20 NOTE — Telephone Encounter (Signed)
Patient notified and agree. Rx sent to pharmacy as patient requested.   Patient will discuss further at appointment in June and schedule a Lab appointment at that time.

## 2022-09-20 NOTE — Addendum Note (Signed)
Addended by: Nelda Severe A on: 09/20/2022 10:04 AM   Modules accepted: Orders

## 2022-09-22 ENCOUNTER — Ambulatory Visit
Admission: RE | Admit: 2022-09-22 | Discharge: 2022-09-22 | Disposition: A | Discharge status: Home / Self Care | Payer: PPO | Source: Ambulatory Visit | Attending: Student | Admitting: Student

## 2022-09-22 DIAGNOSIS — R1011 Right upper quadrant pain: Secondary | ICD-10-CM | POA: Diagnosis not present

## 2022-09-22 DIAGNOSIS — K824 Cholesterolosis of gallbladder: Secondary | ICD-10-CM | POA: Diagnosis not present

## 2022-09-23 ENCOUNTER — Encounter: Payer: Self-pay | Admitting: Student

## 2022-10-11 DIAGNOSIS — M6281 Muscle weakness (generalized): Secondary | ICD-10-CM | POA: Diagnosis not present

## 2022-10-11 DIAGNOSIS — M549 Dorsalgia, unspecified: Secondary | ICD-10-CM | POA: Diagnosis not present

## 2022-10-11 DIAGNOSIS — G8929 Other chronic pain: Secondary | ICD-10-CM | POA: Diagnosis not present

## 2022-10-26 ENCOUNTER — Encounter: Payer: Self-pay | Admitting: Student

## 2022-10-26 ENCOUNTER — Ambulatory Visit: Payer: PPO | Admitting: Student

## 2022-10-26 VITALS — BP 134/84 | HR 86 | Temp 98.1°F | Ht <= 58 in | Wt 135.0 lb

## 2022-10-26 DIAGNOSIS — K449 Diaphragmatic hernia without obstruction or gangrene: Secondary | ICD-10-CM | POA: Diagnosis not present

## 2022-10-26 DIAGNOSIS — E669 Obesity, unspecified: Secondary | ICD-10-CM | POA: Diagnosis not present

## 2022-10-26 DIAGNOSIS — R7303 Prediabetes: Secondary | ICD-10-CM

## 2022-10-26 DIAGNOSIS — M461 Sacroiliitis, not elsewhere classified: Secondary | ICD-10-CM | POA: Diagnosis not present

## 2022-10-26 NOTE — Patient Instructions (Signed)
I've put in a new consultation for you for nutrition  Please continue using salonpas.

## 2022-10-26 NOTE — Progress Notes (Unsigned)
Location:  [facility] clinic [facility].   Provider: Dr. Earnestine Mealing  Code Status: DNR Goals of Care:     10/26/2022    1:29 PM  Advanced Directives  Does Patient Have a Medical Advance Directive? Yes  Type of Estate agent of [place];Out of facility DNR (pink MOST or yellow form);Living will  Does patient want to make changes to medical advance directive? No - Patient declined  Copy of Healthcare Power of Attorney in Chart? Yes - validated most recent copy scanned in chart (See row information)     Chief Complaint  Patient presents with   Medical Management of Chronic Issues    Medical Management of Chronic Issues. 6 Week Follow up Back and Abdominal Pain.    Quality Metric Gaps    To discuss need for Hep C Screening.     HPI: Patient is a 77 y.o. female seen today for medical management of chronic diseases.  Follow up Back and Abdominal Pain.   She has been going to therapy with folks at [facility] and she is doing better in a lot of ways. She has more strength and mobility. She continues to have some evaluation  Somethings havenet changed. Still has sacroiliac pain. Thoracic pain is better, but depends on what she is doing. Has some pain with sewing which she really . In [place] she was getting epidural injections which helped. When she came here she didn't continue with anything. She had steroid injections which were helpful for a periods - was w/ Dr. Yves Dill at [facility].   When she walks a lot she starts to "lock up." She lives with it and it's okay. It is changing and is more frequent.   She has spots on her neck that was biopsied in [facility] and it was "collagenous tissue." Still waiting for dermatology because they are not in. 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 They may see cysts on x-rays ets.   She is down 4 lbs. She is drinking premier protein -- 8 ounces of skim milk  and no sugar and 2 scoops of the powder. Has a small  pear. Lunch she has carrots and cottage cheese. She will have 1-2 slices of whole grain bread.   She isn't interested in GLP-1 GoLO Salonpas doesn't really help.   Past Medical History:  Diagnosis Date   Chronic kidney disease    RENAL INSUFF   Complication of anesthesia    RESPIRATORY PROBLEMS WITH SHOULDER SURGERY   Dysrhythmia    Edema    FEET/ANKLES   Erosive esophagitis    Gallbladder polyp    GERD (gastroesophageal reflux disease)    Heart murmur    Hepatic hemangioma    Hepatitis    A IN PAST   History of hiatal hernia    Hyperlipidemia    Mild intermittent asthma    MVP (mitral valve prolapse)    with slight regurgitation   Orthopnea    Osteoporosis    Proteinuria    Pulmonary nodule 2000's   stable over years--no more testing   Sleep disturbance    chronic   Thyroid nodule     Past Surgical History:  Procedure Laterality Date   APPENDECTOMY     CARPAL TUNNEL RELEASE Bilateral 1987   CATARACT EXTRACTION W/PHACO Left 08/17/2017   Procedure: CATARACT EXTRACTION PHACO AND INTRAOCULAR LENS PLACEMENT (IOC);  Surgeon: Nevada Crane, MD;  Location: [facility] ORS;  Service: Ophthalmology;  Laterality: Left;  [place] 00:29.0 AP% 11.1 CDE 3.21 Fluid Pack lot # [id number]   CATARACT EXTRACTION W/PHACO Right 09/14/2017   Procedure: CATARACT EXTRACTION PHACO AND INTRAOCULAR LENS PLACEMENT (IOC);  Surgeon: Nevada Crane, MD;  Location: [facility] ORS;  Service: Ophthalmology;  Laterality: Right;  [place] 00:21.0 AP% 4.7 CDE 1.00 Fluid Pack Lot # [id number]   GANGLION CYST EXCISION Right    SHOULDER SURGERY Bilateral 1988, 2001   TONSILLECTOMY     TRIGGER FINGER RELEASE Right     Allergies  Allergen Reactions   Azithromycin Shortness Of Breath    SOB abd pain diarrhea   Avelox [Moxifloxacin Hcl In Nacl] Hives   Ciprocin-Fluocin-Procin [Fluocinolone]    Penicillins Hives    Has patient had a PCN reaction causing immediate rash, facial/tongue/throat swelling, SOB or  lightheadedness with hypotension: no Has patient had a PCN reaction causing severe rash involving mucus membranes or skin necrosis: no Has patient had a PCN reaction that required hospitalization: no Has patient had a PCN reaction occurring within the last 10 years: no If all of the above answers are "NO", then may proceed with Cephalosporin use.    Quinolones Hives    Outpatient Encounter Medications as of 10/26/2022  Medication Sig   acetaminophen (TYLENOL) 500 MG tablet Take 1,000 mg by mouth every 8 (eight) hours as needed.   atorvastatin (LIPITOR) 20 MG tablet Take 20 mg by mouth daily.   cetirizine (ZYRTEC) 10 MG tablet Take 10 mg by mouth daily.   cholecalciferol (VITAMIN D) 1000 units tablet Take 3,000 Units by mouth daily.   metoprolol succinate (TOPROL-XL) 25 MG 24 hr tablet TAKE 1 TABLET(25 MG) BY MOUTH DAILY   montelukast (SINGULAIR) 10 MG tablet TAKE 1 TABLET(10 MG) BY MOUTH DAILY   Olopatadine HCl 0.7 % SOLN Place 1 drop into both eyes daily.   Polyethyl Glycol-Propyl Glycol (SYSTANE OP) Apply 1 drop to eye as needed.   [DISCONTINUED] atorvastatin (LIPITOR) 20 MG tablet TAKE 1 TABLET(10 MG) BY MOUTH DAILY (Patient taking differently: TAKE 1 TABLET BY MOUTH DAILY)   No facility-administered encounter medications on file as of 10/26/2022.    Review of Systems:  Review of Systems  Health Maintenance  Topic Date Due   Hepatitis C Screening  Never done   INFLUENZA VACCINE  12/08/2022   Medicare Annual Wellness (AWV)  03/23/2023   DTaP/Tdap/Td (3 - Tdap) 02/17/2025   Pneumonia Vaccine 73+ Years old  Completed   DEXA SCAN  Completed   COVID-19 Vaccine  Completed   Zoster Vaccines- Shingrix  Completed   HPV VACCINES  Aged Out   Colonoscopy  Discontinued    Physical Exam: Vitals:   10/26/22 1325  BP: 134/84  Pulse: 86  Temp: 98.1 F (36.7 C)  SpO2: 96%  Weight: 135 lb (61.2 kg)  Height: 4' 5.5" (1.359 m)   Body mass index is 33.16 kg/m. Physical Exam  Labs  reviewed: Basic Metabolic Panel: Recent Labs    12/21/21 0956 09/12/22 0000  NA 136 139  K 4.1 4.2  CL 101 102  CO2 27 28*  GLUCOSE 128*  --   BUN 22 26*  CREATININE 0.69 0.7  CALCIUM 9.7 9.6  TSH  --  3.42   Liver Function Tests: Recent Labs    12/21/21 0956 09/12/22 0000  AST 19 13  ALT 21 11  ALKPHOS 55 64  BILITOT 0.5  --   PROT 7.8  --   ALBUMIN 4.4 4.4  No results for input(s): "LIPASE", "AMYLASE" in the last 8760 hours. No results for input(s): "AMMONIA" in the last 8760 hours. CBC: Recent Labs    12/21/21 0956 09/12/22 0000  WBC 8.1 6.4  NEUTROABS  --  4,250.00  HGB 12.8 12.7  HCT 38.3 38  MCV 88.3  --   PLT 266.0 256   Lipid Panel: Recent Labs    12/21/21 0956 09/12/22 0000  CHOL 198 208*  HDL 40.20 43  LDLCALC  --  116  TRIG 489.0 Triglyceride is over 400; calculations on Lipids are invalid.* 340*  CHOLHDL 5  --   LDLDIRECT 90.0  --    Lab Results  Component Value Date   HGBA1C 6.4 09/12/2022    Procedures since last visit: No results found.  Assessment/Plan There are no diagnoses linked to this encounter.   Labs/tests ordered:  * No order type specified * Next appt:  Visit date not found

## 2022-11-08 ENCOUNTER — Encounter: Payer: Self-pay | Admitting: Dietician

## 2022-11-08 ENCOUNTER — Encounter: Payer: PPO | Attending: Student | Admitting: Dietician

## 2022-11-08 DIAGNOSIS — Z713 Dietary counseling and surveillance: Secondary | ICD-10-CM | POA: Insufficient documentation

## 2022-11-08 DIAGNOSIS — R7303 Prediabetes: Secondary | ICD-10-CM | POA: Insufficient documentation

## 2022-11-08 DIAGNOSIS — G8929 Other chronic pain: Secondary | ICD-10-CM | POA: Diagnosis not present

## 2022-11-08 DIAGNOSIS — Z6833 Body mass index (BMI) 33.0-33.9, adult: Secondary | ICD-10-CM | POA: Insufficient documentation

## 2022-11-08 DIAGNOSIS — E669 Obesity, unspecified: Secondary | ICD-10-CM | POA: Diagnosis not present

## 2022-11-08 DIAGNOSIS — M549 Dorsalgia, unspecified: Secondary | ICD-10-CM | POA: Diagnosis not present

## 2022-11-08 DIAGNOSIS — M6281 Muscle weakness (generalized): Secondary | ICD-10-CM | POA: Diagnosis not present

## 2022-11-08 NOTE — Patient Instructions (Addendum)
Keep up the good work eating consistently throughout the day!!   Work on trying to balance carbs with a source a protein when you eat.  Try Dark Chocolate Hummus as a source of protein to pair with your fruit!  Remember the effects of stress and cortisol on your body!! Take a little time to honestly assess what things are contributing to your stress level, and what you can do to begin to mitigate your stress!

## 2022-11-08 NOTE — Progress Notes (Signed)
Medical Nutrition Therapy  Appointment Start time:  1400  Appointment End time:  1515  Primary concerns today: Blood sugar, Lipids, Weight change  Referral diagnosis: R73.03 - Prediabetes, E66.9 - Obesity Preferred learning style: No preference indicated Learning readiness: Ready   NUTRITION ASSESSMENT   Anthropometrics  (Taken from 10/26/2022 PCP visit, Pt declined today) Ht: 4'5" Wt: 135 lbs BMI: 33.16 kg/m2   Clinical Medical Hx: Erosive esophagitis, HLD, Prediabetes Medications: Metoprolol, Atorvastatin,  Labs (09/12/2022): A1c - 6.4%, TGL - 340 mg/dL (high), TC - 409 mg/dL (high), LDL - 811 mg/dL (high) Notable Signs/Symptoms: N/A   Lifestyle & Dietary Hx Pt reports changes in body since starting menopause, gained weight, elevation in blood lipids. Pt reports decline in physical activity over the last few years due to sacroiliac pain. Pt states they believe their weight gain is mostly due to inactivity. Pt reports history of severe reflux, got to a point where they couldn't eat much, reports H. Pylori infection diagnosis and history of hiatal hernia, tried PPIs with little relief. Pt states they still deal with reflux at times, states that eating bread or ice cream helps to calm symptoms. Pt reports high stress due to family troubles in the last couple of years. Pt reports emotional eating at times, states they will tend to eat more snack foods (carbs) on days where they have poor sleep the night before. Pt brought food log to visit, states that they eat every ~3 hours. Pt reports trying to restrict carbs from diet recently. Pt reports esophageal pain when eating meats/yogurt, states they find it tough to "get it down" Pt reports being sensitive to food tastes, smells, and textures. Pt states they don't like frozen or canned foods. Pt states they find it difficult to cook anymore, eating more readily prepared/edible foods.   Estimated daily fluid intake: 64 oz Supplements: Vit  D Sleep: Difficulty staying asleep Stress / self-care: High previously, improved but still present Current average weekly physical activity: ADLs   24-Hr Dietary Recall First Meal: 2 scoops premier protein powder (30g PRO), fruit, 8 oz water Snack: 1 cutie Second Meal: Can tuna, 1/2 slice of bread Snack: pear, peanut butter Third Meal: Apple Snack: none Beverages: water, skim milk    NUTRITION DIAGNOSIS  [place]-2.2 Altered nutrition-related laboratory As related to Prediabetes, Hyperlipidemia.  As evidenced by A1c of 6.4%, Triglycerides of 340 mg/dL, total cholesterol of 914 mg/dL.   NUTRITION INTERVENTION  Nutrition education (E-1) on the following topics:  Educated patient on the pathophysiology of diabetes. This includes why our bodies need circulating blood sugar, the relationship between insulin and blood sugar, and the results of insulin resistance and/or pancreatic insufficiency on the development of diabetes. Educated patient on factors that contribute to elevation of blood sugars, such as stress, illness, injury,and food choices. Discussed the role that physical activity plays in lowering blood sugar.  Educate patient on the three main macronutrients. Protein, fats, and carbohydrates. Discussed how each of these macronutrients affect blood sugar levels, especially carbohydrate, and the importance of eating a consistent amount of carbohydrate throughout the day. Educated patient on contributing factors that influence blood lipids and strategies to improve values of concern, including triglycerides and cholesterol.   Handouts Provided Include  Stress Response   Learning Style & Readiness for Change Teaching method utilized: Visual & Auditory  Demonstrated degree of understanding via: Teach Back  Barriers to learning/adherence to lifestyle change: Stress   Goals Established by Pt Keep up the good work eating  consistently throughout the day!!  Work on trying to balance carbs  with a source a protein when you eat. Try Dark Chocolate Hummus as a source of protein to pair with your fruit! Remember the effects of stress and cortisol on your body!! Take a little time to honestly assess what things are contributing to your stress level, and what you can do to begin to mitigate your stress!    MONITORING & EVALUATION Dietary intake, weekly physical activity, labs, and stress levels in 3 months.  Next Steps  Patient is to call and schedule follow-up

## 2022-12-08 ENCOUNTER — Encounter: Payer: Self-pay | Admitting: Internal Medicine

## 2022-12-08 ENCOUNTER — Ambulatory Visit: Payer: PPO | Admitting: Internal Medicine

## 2022-12-08 VITALS — BP 120/70 | HR 76 | Ht <= 58 in | Wt 134.0 lb

## 2022-12-08 DIAGNOSIS — K2210 Ulcer of esophagus without bleeding: Secondary | ICD-10-CM | POA: Diagnosis not present

## 2022-12-08 DIAGNOSIS — K219 Gastro-esophageal reflux disease without esophagitis: Secondary | ICD-10-CM | POA: Diagnosis not present

## 2022-12-08 DIAGNOSIS — G8929 Other chronic pain: Secondary | ICD-10-CM

## 2022-12-08 DIAGNOSIS — R1011 Right upper quadrant pain: Secondary | ICD-10-CM | POA: Diagnosis not present

## 2022-12-08 DIAGNOSIS — E669 Obesity, unspecified: Secondary | ICD-10-CM

## 2022-12-08 DIAGNOSIS — K222 Esophageal obstruction: Secondary | ICD-10-CM | POA: Diagnosis not present

## 2022-12-08 DIAGNOSIS — R1310 Dysphagia, unspecified: Secondary | ICD-10-CM

## 2022-12-08 MED ORDER — PANTOPRAZOLE SODIUM 40 MG PO TBEC: 40.0000 mg | DELAYED_RELEASE_TABLET | Freq: Every day | ORAL | 3 refills | Status: DC

## 2022-12-08 NOTE — Patient Instructions (Signed)
We have sent the following medications to your pharmacy for you to pick up at your convenience:  Pantoprazole  You have been scheduled for an endoscopy. Please follow written instructions given to you at your visit today.  If you use inhalers (even only as needed), please bring them with you on the day of your procedure.  If you take any of the following medications, they will need to be adjusted prior to your procedure:   DO NOT TAKE 7 DAYS PRIOR TO TEST- Trulicity (dulaglutide) Ozempic, Wegovy (semaglutide) Mounjaro (tirzepatide) Bydureon Bcise (exanatide extended release)  DO NOT TAKE 1 DAY PRIOR TO YOUR TEST Rybelsus (semaglutide) Adlyxin (lixisenatide) Victoza (liraglutide) Byetta (exanatide) ___________________________________________________________________________   _______________________________________________________  If your blood pressure at your visit was 140/90 or greater, please contact your primary care physician to follow up on this.  _______________________________________________________  If you are age 77 or older, your body mass index should be between 23-30. Your Body mass index is 32.92 kg/m. If this is out of the aforementioned range listed, please consider follow up with your Primary Care Provider.  If you are age 67 or younger, your body mass index should be between 19-25. Your Body mass index is 32.92 kg/m. If this is out of the aformentioned range listed, please consider follow up with your Primary Care Provider.   ________________________________________________________  The [place] [facility] providers would like to encourage you to use [facility] to communicate with providers for non-urgent requests or questions.  Due to long hold times on the telephone, sending your provider a message by [facility] may be a faster and more efficient way to get a response.  Please allow 48 business hours for a response.  Please remember that this is for non-urgent requests.   _______________________________________________________

## 2022-12-08 NOTE — Progress Notes (Signed)
HISTORY OF PRESENT ILLNESS:  Shelby Oliver is a 77 y.o. female, with multiple medical problems as listed below, who is sent today by her primary care provider regarding problems with chronic recurrent right upper quadrant pain and reflux disease.  The patient was initially evaluated in this office April 16, 2018 regarding GERD, intermittent solid food dysphagia, and extensive GI history in [place].  See that dictation for details.  She subsequently underwent upper endoscopy May 11, 2018.  She was found to have erosive esophagitis and a benign ringlike distal stricture.  In addition moderate hiatal hernia.  Stricture was balloon dilated to 20 mm.  She was prescribed omeprazole 40 mg daily.  As requested, she followed up in the office June 26, 2018.  At that time she was asymptomatic post dilation on PPI.  She was instructed to continue omeprazole daily and follow-up in this office in 1 year.  She did not.  Patient tells me that she stopped taking omeprazole about 18 months after it was prescribed.  She had concerns over possible side effects, namely kidney disease.  She felt that she could manage her reflux disease with good dietary habits.  Despite this, she does describe to me intermittent problems with solid food dysphagia items such as meats and breads.  Associated with that his discomfort.  She also has regurgitation episodes and occasional pyrosis.  Next, she reports a 20-year history of intermittent right upper quadrant pain.  She had this evaluated many many years ago in [place] including MRI (which she brings with her today, that report).  Benign findings.  She recently underwent abdominal ultrasound Sep 22, 2022.  She was found to have fatty liver and a small gallbladder polyp.  No other abnormalities.  Pain has resolved.  She describes to me musculoskeletal type pain which is affected by certain movements and bending.  She has gained about 10 pounds since her last office  visit.  Review of blood work from Sep 12, 2022 shows normal liver tests.  Normal CBC with hemoglobin 12.7.  Normal renal function with creatinine 0.7 and GFR 90  REVIEW OF SYSTEMS:  All non-GI ROS negative unless otherwise stated in the HPI except for sinus and allergy trouble, arthritis, back pain, fatigue, heart murmur, muscle cramps, sleeping problems, lower extremity swelling  Past Medical History:  Diagnosis Date   Chronic kidney disease    RENAL INSUFF   Complication of anesthesia    RESPIRATORY PROBLEMS WITH SHOULDER SURGERY   Dysrhythmia    Edema    FEET/ANKLES   Erosive esophagitis    Gallbladder polyp    GERD (gastroesophageal reflux disease)    Heart murmur    Hepatic hemangioma    Hepatitis    A IN PAST   History of hiatal hernia    Hyperlipidemia    Mild intermittent asthma    MVP (mitral valve prolapse)    with slight regurgitation   Orthopnea    Osteoporosis    Proteinuria    Pulmonary nodule 2000's   stable over years--no more testing   Sleep disturbance    chronic   Thyroid nodule     Past Surgical History:  Procedure Laterality Date   APPENDECTOMY     CARPAL TUNNEL RELEASE Bilateral 1987   CATARACT EXTRACTION W/PHACO Left 08/17/2017   Procedure: CATARACT EXTRACTION PHACO AND INTRAOCULAR LENS PLACEMENT (IOC);  Surgeon: Nevada Crane, MD;  Location: [facility];  Service: Ophthalmology;  Laterality: Left;  [place] 00:29.0 AP% 11.1 CDE  3.21 Fluid Pack lot # [id number]   CATARACT EXTRACTION W/PHACO Right 09/14/2017   Procedure: CATARACT EXTRACTION PHACO AND INTRAOCULAR LENS PLACEMENT (IOC);  Surgeon: Nevada Crane, MD;  Location: [facility];  Service: Ophthalmology;  Laterality: Right;  [place] 00:21.0 AP% 4.7 CDE 1.00 Fluid Pack Lot # [id number]   GANGLION CYST EXCISION Right    SHOULDER SURGERY Bilateral 1988, 2001   TONSILLECTOMY     TRIGGER FINGER RELEASE Right     Social History Shelby Oliver  reports that she has never smoked. She has been exposed  to tobacco smoke. She has never used smokeless tobacco. She reports that she does not drink alcohol and does not use drugs.  family history includes Arthritis in her father and mother; Congestive Heart Failure in her sister; Heart disease in her sister; Hyperlipidemia in her mother; Hypertension in her mother and sister; Kidney cancer in her mother; Stroke in her father.  Allergies  Allergen Reactions   Azithromycin Shortness Of Breath    SOB abd pain diarrhea   Avelox [Moxifloxacin Hcl In Nacl] Hives   Ciprocin-Fluocin-Procin [Fluocinolone]    Penicillins Hives    Has patient had a PCN reaction causing immediate rash, facial/tongue/throat swelling, SOB or lightheadedness with hypotension: no Has patient had a PCN reaction causing severe rash involving mucus membranes or skin necrosis: no Has patient had a PCN reaction that required hospitalization: no Has patient had a PCN reaction occurring within the last 10 years: no If all of the above answers are "NO", then may proceed with Cephalosporin use.    Quinolones Hives       PHYSICAL EXAMINATION: Vital signs: BP 120/70   Pulse 76   Ht 4' 5.5" (1.359 m)   Wt 134 lb (60.8 kg)   BMI 32.92 kg/m   Constitutional: generally well-appearing, no acute distress Psychiatric: alert and oriented x3, cooperative Eyes: extraocular movements intact, anicteric, conjunctiva pink Mouth: oral pharynx moist, no lesions Neck: supple no lymphadenopathy Cardiovascular: heart regular rate and rhythm. Lungs: clear to auscultation bilaterally Abdomen: soft, nontender, mild tenderness right upper quadrant along the rib cage nondistended, no obvious ascites, no peritoneal signs, normal bowel sounds, no organomegaly Rectal: Omitted Extremities: no clubbing or cyanosis.  Trace lower extremity edema bilaterally Skin: no lesions on visible extremities Neuro: No focal deficits.  Cranial nerves intact  ASSESSMENT:  1.  GERD.  Intermittent active symptoms 2.   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  Chronic intermittent right upper quadrant pain most consistent with musculoskeletal etiology 4.  General medical problems.  Stable   PLAN:  1.  Reflux precautions 2.  Reinitiate PPI therapy 3.  Prescribe pantoprazole 40 mg daily.  Medication risks reviewed 4.  Schedule upper endoscopy with esophageal dilation.The nature of the procedure, as well as the risks, benefits, and alternatives were carefully and thoroughly reviewed with the patient. Ample time for discussion and questions allowed. The patient understood, was satisfied, and agreed to proceed. 5.  Recommend at least annual office follow-up.  She agrees 6.  Ongoing general medical care with Dr. Sydnee Cabal A total time of 60 minutes was spent preparing to see the patient, reviewing the myriad of records (including remote records from [place]), obtaining comprehensive history, performing medically appropriate physical exam, counseling and educating the patient regarding the above listed issues, ordering medication, ordering therapeutic endoscopic procedure, and documenting clinical information in the health record.

## 2022-12-27 ENCOUNTER — Encounter: Payer: Self-pay | Admitting: Internal Medicine

## 2023-01-06 ENCOUNTER — Ambulatory Visit (AMBULATORY_SURGERY_CENTER): Payer: PPO | Admitting: Internal Medicine

## 2023-01-06 ENCOUNTER — Encounter: Payer: Self-pay | Admitting: Internal Medicine

## 2023-01-06 VITALS — BP 144/73 | HR 67 | Temp 97.3°F | Resp 14 | Ht <= 58 in | Wt 134.0 lb

## 2023-01-06 DIAGNOSIS — R1310 Dysphagia, unspecified: Secondary | ICD-10-CM | POA: Diagnosis not present

## 2023-01-06 DIAGNOSIS — K219 Gastro-esophageal reflux disease without esophagitis: Secondary | ICD-10-CM

## 2023-01-06 DIAGNOSIS — K222 Esophageal obstruction: Secondary | ICD-10-CM | POA: Diagnosis not present

## 2023-01-06 DIAGNOSIS — G479 Sleep disorder, unspecified: Secondary | ICD-10-CM | POA: Diagnosis not present

## 2023-01-06 MED ORDER — SODIUM CHLORIDE 0.9 % IV SOLN: 500.0000 mL | Freq: Once | INTRAVENOUS | Status: DC

## 2023-01-06 NOTE — Progress Notes (Signed)
Sedate, gd SR, tolerated procedure well, VSS, report to RN 

## 2023-01-06 NOTE — Patient Instructions (Addendum)
Continue present medications.  Post dilation diet (instructions provided).  Follow up appointment with Dr. Marina Goodell in 3 months (His nurse will call to schedule).  YOU HAD AN ENDOSCOPIC PROCEDURE TODAY AT THE [facility]:   Refer to the procedure report that was given to you for any specific questions about what was found during the examination.  If the procedure report does not answer your questions, please call your gastroenterologist to clarify.  If you requested that your care partner not be given the details of your procedure findings, then the procedure report has been included in a sealed envelope for you to review at your convenience later.  YOU SHOULD EXPECT: Some feelings of bloating in the abdomen. Passage of more gas than usual.  Walking can help get rid of the air that was put into your GI tract during the procedure and reduce the bloating. If you had a lower endoscopy (such as a colonoscopy or flexible sigmoidoscopy) you may notice spotting of blood in your stool or on the toilet paper. If you underwent a bowel prep for your procedure, you may not have a normal bowel movement for a few days.  Please Note:  You might notice some irritation and congestion in your nose or some drainage.  This is from the oxygen used during your procedure.  There is no need for concern and it should clear up in a day or so.  SYMPTOMS TO REPORT IMMEDIATELY:  Following upper endoscopy (EGD)  Vomiting of blood or coffee ground material  New chest pain or pain under the shoulder blades  Painful or persistently difficult swallowing  New shortness of breath  Fever of 100F or higher  Black, tarry-looking stools  For urgent or emergent issues, a gastroenterologist can be reached at any hour by calling [phone]. Do not use MyChart messaging for urgent concerns.    DIET:  Post dilation diet (Instructions and handout provided).  Drink plenty of fluids but you should avoid alcoholic beverages  for 24 hours.  ACTIVITY:  You should plan to take it easy for the rest of today and you should NOT DRIVE or use heavy machinery until tomorrow (because of the sedation medicines used during the test).    FOLLOW UP: Our staff will call the number listed on your records the next business day following your procedure.  We will call around 7:15- 8:00 am to check on you and address any questions or concerns that you may have regarding the information given to you following your procedure. If we do not reach you, we will leave a message.     If any biopsies were taken you will be contacted by phone or by letter within the next 1-3 weeks.  Please call us at [phone] if you have not heard about the biopsies in 3 weeks.    SIGNATURES/CONFIDENTIALITY: You and/or your care partner have signed paperwork which will be entered into your electronic medical record.  These signatures attest to the fact that that the information above on your After Visit Summary has been reviewed and is understood.  Full responsibility of the confidentiality of this discharge information lies with you and/or your care-partner.

## 2023-01-06 NOTE — Op Note (Signed)
[facility] Patient Name: Shelby Oliver Procedure Date: 01/06/2023 9:42 AM MRN: [id number] Endoscopist: Wilhemina Bonito. Marina Goodell , MD, [id number] Age: 77 Referring MD:  Date of Birth: [birth year] Gender: Female Account #: [id number] Procedure:                Upper GI endoscopy with ballppn dilation 18-20mm Indications:              Dysphagia, Therapeutic procedure, Esophageal                            reflux. doing better on pantoprazole Medicines:                Monitored Anesthesia Care Procedure:                Pre-Anesthesia Assessment:                           - Prior to the procedure, a History and Physical                            was performed, and patient medications and                            allergies were reviewed. The patient's tolerance of                            previous anesthesia was also reviewed. The risks                            and benefits of the procedure and the sedation                            options and risks were discussed with the patient.                            All questions were answered, and informed consent                            was obtained. Prior Anticoagulants: The patient has                            taken no anticoagulant or antiplatelet agents. ASA                            Grade Assessment: II - A patient with mild systemic                            disease. After reviewing the risks and benefits,                            the patient was deemed in satisfactory condition to                            undergo the procedure.  After obtaining informed consent, the endoscope was                            passed under direct vision. Throughout the                            procedure, the patient's blood pressure, pulse, and                            oxygen saturations were monitored continuously. The                            GIF [id number] #[id number] was introduced through the                             mouth, and advanced to the second part of duodenum.                            The upper GI endoscopy was accomplished without                            difficulty. The patient tolerated the procedure                            well. Scope In: Scope Out: Findings:                 One benign-appearing, intrinsic moderate stenosis                            was found 31 cm from the incisors. This stenosis                            measured 1.5 cm (inner diameter). A TTS dilator was                            passed through the scope. Dilation with an 18-19-20                            mm balloon dilator was performed to 20 mm.                           The exam of the esophagus also revealed esophagitis                            (friability and edema).                           The stomach was normal, save large hiatal hernia.                           The examined duodenum was normal.                           The cardia and gastric  fundus were normal on                            retroflexion. Complications:            No immediate complications. Estimated Blood Loss:     Estimated blood loss: none. Impression:               - Benign-appearing esophageal stenosis. Dilated.                            Esophagitis.                           - Normal stomach. Large H/H.                           - Normal examined duodenum.                           - No specimens collected. Recommendation:           - Patient has a contact number available for                            emergencies. The signs and symptoms of potential                            delayed complications were discussed with the                            patient. Return to normal activities tomorrow.                            Written discharge instructions were provided to the                            patient.                           - Post dilation diet.                           - Continue present  medications.                           - Office follow up with Dr Marina Goodell in 3 months Wilhemina Bonito. Marina Goodell, MD 01/06/2023 10:15:37 AM This report has been signed electronically.

## 2023-01-06 NOTE — Progress Notes (Signed)
Dr. Marina Goodell asked she be scheduled for a follow up visit with him in 3 months.  No appointments available.  Message sent to [facility] to please schedule this patient. SChaplin, RN,BSN

## 2023-01-06 NOTE — Progress Notes (Signed)
Expand All Collapse All HISTORY OF PRESENT ILLNESS:   Shelby Oliver is a 77 y.o. female, with multiple medical problems as listed below, who is sent today by her primary care provider regarding problems with chronic recurrent right upper quadrant pain and reflux disease.   The patient was initially evaluated in this office April 16, 2018 regarding GERD, intermittent solid food dysphagia, and extensive GI history in [place].  See that dictation for details.  She subsequently underwent upper endoscopy May 11, 2018.  She was found to have erosive esophagitis and a benign ringlike distal stricture.  In addition moderate hiatal hernia.  Stricture was balloon dilated to 20 mm.  She was prescribed omeprazole 40 mg daily.  As requested, she followed up in the office June 26, 2018.  At that time she was asymptomatic post dilation on PPI.  She was instructed to continue omeprazole daily and follow-up in this office in 1 year.  She did not.   Patient tells me that she stopped taking omeprazole about 18 months after it was prescribed.  She had concerns over possible side effects, namely kidney disease.  She felt that she could manage her reflux disease with good dietary habits.  Despite this, she does describe to me intermittent problems with solid food dysphagia items such as meats and breads.  Associated with that his discomfort.  She also has regurgitation episodes and occasional pyrosis.  Next, she reports a 20-year history of intermittent right upper quadrant pain.  She had this evaluated many many years ago in [place] including MRI (which she brings with her today, that report).  Benign findings.  She recently underwent abdominal ultrasound Sep 22, 2022.  She was found to have fatty liver and a small gallbladder polyp.  No other abnormalities.  Pain has resolved.  She describes to me musculoskeletal type pain which is affected by certain movements and bending.  She has gained about 10 pounds since her  last office visit.   Review of blood work from Sep 12, 2022 shows normal liver tests.  Normal CBC with hemoglobin 12.7.  Normal renal function with creatinine 0.7 and GFR 90   REVIEW OF SYSTEMS:   All non-GI ROS negative unless otherwise stated in the HPI except for sinus and allergy trouble, arthritis, back pain, fatigue, heart murmur, muscle cramps, sleeping problems, lower extremity swelling       Past Medical History:  Diagnosis Date   Chronic kidney disease      RENAL INSUFF   Complication of anesthesia      RESPIRATORY PROBLEMS WITH SHOULDER SURGERY   Dysrhythmia     Edema      FEET/ANKLES   Erosive esophagitis     Gallbladder polyp     GERD (gastroesophageal reflux disease)     Heart murmur     Hepatic hemangioma     Hepatitis      A IN PAST   History of hiatal hernia     Hyperlipidemia     Mild intermittent asthma     MVP (mitral valve prolapse)      with slight regurgitation   Orthopnea     Osteoporosis     Proteinuria     Pulmonary nodule 2000's    stable over years--no more testing   Sleep disturbance      chronic   Thyroid nodule                 Past Surgical History:  Procedure Laterality Date  APPENDECTOMY       CARPAL TUNNEL RELEASE Bilateral 1987   CATARACT EXTRACTION W/PHACO Left 08/17/2017    Procedure: CATARACT EXTRACTION PHACO AND INTRAOCULAR LENS PLACEMENT (IOC);  Surgeon: Nevada Crane, MD;  Location: [facility];  Service: Ophthalmology;  Laterality: Left;  [place] 00:29.0 AP% 11.1 CDE 3.21 Fluid Pack lot # [id number]   CATARACT EXTRACTION W/PHACO Right 09/14/2017    Procedure: CATARACT EXTRACTION PHACO AND INTRAOCULAR LENS PLACEMENT (IOC);  Surgeon: Nevada Crane, MD;  Location: [facility] OR[facility];  Service: Ophthalmology;  Laterality: Right;  [place] 00:21.0 AP% 4.7 CDE 1.00 Fluid Pack Lot # [id number]   GANGLION CYST EXCISION Right     SHOULDER SURGERY Bilateral 1988, 2001   TONSILLECTOMY       TRIGGER FINGER RELEASE Right            Social  History Shelby Oliver  reports that she has never smoked. She has been exposed to tobacco smoke. She has never used smokeless tobacco. She reports that she does not drink alcohol and does not use drugs.   family history includes Arthritis in her father and mother; Congestive Heart Failure in her sister; Heart disease in her sister; Hyperlipidemia in her mother; Hypertension in her mother and sister; Kidney cancer in her mother; Stroke in her father.   Allergies       Allergies  Allergen Reactions   Azithromycin Shortness Of Breath      SOB abd pain diarrhea   Avelox [Moxifloxacin Hcl In Nacl] Hives   Ciprocin-Fluocin-Procin [Fluocinolone]     Penicillins Hives      Has patient had a PCN reaction causing immediate rash, facial/tongue/throat swelling, SOB or lightheadedness with hypotension: no Has patient had a PCN reaction causing severe rash involving mucus membranes or skin necrosis: no Has patient had a PCN reaction that required hospitalization: no Has patient had a PCN reaction occurring within the last 10 years: no If all of the above answers are "NO", then may proceed with Cephalosporin use.     Quinolones Hives            PHYSICAL EXAMINATION: Vital signs: BP 120/70   Pulse 76   Ht 4' 5.5" (1.359 m)   Wt 134 lb (60.8 kg)   BMI 32.92 kg/m   Constitutional: generally well-appearing, no acute distress Psychiatric: alert and oriented x3, cooperative Eyes: extraocular movements intact, anicteric, conjunctiva pink Mouth: oral pharynx moist, no lesions Neck: supple no lymphadenopathy Cardiovascular: heart regular rate and rhythm. Lungs: clear to auscultation bilaterally Abdomen: soft, nontender, mild tenderness right upper quadrant along the rib cage nondistended, no obvious ascites, no peritoneal signs, normal bowel sounds, no organomegaly Rectal: Omitted Extremities: no clubbing or cyanosis.  Trace lower extremity edema bilaterally Skin: no lesions on visible  extremities Neuro: No focal deficits.  Cranial nerves intact   ASSESSMENT:   1.  GERD.  Intermittent active symptoms 2.  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  Chronic intermittent right upper quadrant pain most consistent with musculoskeletal etiology 4.  General medical problems.  Stable     PLAN:   1.  Reflux precautions 2.  Reinitiate PPI therapy 3.  Prescribe pantoprazole 40 mg daily.  Medication risks reviewed 4.  Schedule upper endoscopy with esophageal dilation.The nature of the procedure, as well as the risks, benefits, and alternatives were carefully and thoroughly reviewed with the patient. Ample time for discussion and questions allowed. The patient understood, was satisfied, and agreed to proceed. 5.  Recommend at least annual office follow-up.  She agrees 6.  Ongoing general medical care with Dr. Sydnee Cabal

## 2023-01-06 NOTE — Progress Notes (Signed)
Called to room to assist during endoscopic procedure.  Patient ID and intended procedure confirmed with present staff. Received instructions for my participation in the procedure from the performing physician.  

## 2023-01-06 NOTE — Progress Notes (Signed)
 VS by KD  Pt's states no medical or surgical changes since previsit or office visit.

## 2023-01-10 ENCOUNTER — Telehealth: Payer: Self-pay | Admitting: *Deleted

## 2023-01-10 NOTE — Telephone Encounter (Signed)
  Follow up Call-     01/06/2023    9:09 AM  Call back number  Post procedure Call Back phone  # [phone]  Permission to leave phone message Yes     Patient questions:  Do you have a fever, pain , or abdominal swelling? No. Pain Score  0 *  Have you tolerated food without any problems? Yes.    Have you been able to return to your normal activities? Yes.    Do you have any questions about your discharge instructions: Diet   No. Medications  No. Follow up visit  No.  Do you have questions or concerns about your Care? No.  Actions: * If pain score is 4 or above: No action needed, pain <4.

## 2023-01-27 ENCOUNTER — Encounter: Payer: Self-pay | Admitting: Student

## 2023-01-27 ENCOUNTER — Ambulatory Visit: Payer: PPO | Admitting: Student

## 2023-01-27 VITALS — BP 126/84 | HR 82 | Temp 98.1°F | Ht <= 58 in | Wt 135.0 lb

## 2023-01-27 DIAGNOSIS — E669 Obesity, unspecified: Secondary | ICD-10-CM | POA: Diagnosis not present

## 2023-01-27 DIAGNOSIS — M461 Sacroiliitis, not elsewhere classified: Secondary | ICD-10-CM | POA: Diagnosis not present

## 2023-01-27 DIAGNOSIS — K2100 Gastro-esophageal reflux disease with esophagitis, without bleeding: Secondary | ICD-10-CM

## 2023-01-27 NOTE — Patient Instructions (Addendum)
Great seeing you!  Please go to have your x-rays done at your convenience at the following location:  [facility] [address] [place], [place] [address]  Continue ice after your walks.

## 2023-01-27 NOTE — Progress Notes (Unsigned)
Location:  [facility].   Provider: Dr. Earnestine Mealing  Code Status: DNR Goals of Care:     01/27/2023    1:54 PM  Advanced Directives  Does Patient Have a Medical Advance Directive? Yes  Type of Estate agent of [place];Living will;Out of facility DNR (pink MOST or yellow form)  Does patient want to make changes to medical advance directive? No - Patient declined  Copy of Healthcare Power of Attorney in Chart? Yes - validated most recent copy scanned in chart (See row information)     Chief Complaint  Patient presents with   Medical Management of Chronic Issues    Medical Management of Chronic Issues. 3 Month follow up    HPI: Patient is a 77 y.o. female seen today for medical management of chronic diseases.    She completed 3 months of therapy with Aundra Millet and she appreciated it. She had measurements again and she has improved with her upper extremity mobility and flexibility. Left leg feels better as well.   Sacroillitis: not much improvement on the left as of yet. She has burning sensation over the lower back and over the right hip. [place] in the last 3 weeks. She felt like her legs were "rubbery" and wasn't feeling as strong. Her phone tracks it. She does about 5k steps and had to take the break because the hip acts. She doesn't know what is bothering her at this time.   She continues the stretching advised by physical therapy.   When she has restless leg   She still sees the nutritionist. Next visit is in October.   GI problems.    Past Medical History:  Diagnosis Date   Chronic kidney disease    RENAL INSUFF   Complication of anesthesia    RESPIRATORY PROBLEMS WITH SHOULDER SURGERY   Dysrhythmia    Edema    FEET/ANKLES   Erosive esophagitis    Gallbladder polyp    GERD (gastroesophageal reflux disease)    Heart murmur    Hepatic hemangioma    Hepatitis    A IN PAST   History of hiatal hernia    Hyperlipidemia    Mild  intermittent asthma    MVP (mitral valve prolapse)    with slight regurgitation   Orthopnea    Osteoporosis    Proteinuria    Pulmonary nodule 2000's   stable over years--no more testing   Sleep disturbance    chronic   Thyroid nodule     Past Surgical History:  Procedure Laterality Date   APPENDECTOMY     CARPAL TUNNEL RELEASE Bilateral 1987   CATARACT EXTRACTION W/PHACO Left 08/17/2017   Procedure: CATARACT EXTRACTION PHACO AND INTRAOCULAR LENS PLACEMENT (IOC);  Surgeon: Nevada Crane, MD;  Location: [facility];  Service: Ophthalmology;  Laterality: Left;  [place] 00:29.0 AP% 11.1 CDE 3.21 Fluid Pack lot # [id number]   CATARACT EXTRACTION W/PHACO Right 09/14/2017   Procedure: CATARACT EXTRACTION PHACO AND INTRAOCULAR LENS PLACEMENT (IOC);  Surgeon: Nevada Crane, MD;  Location: [facility];  Service: Ophthalmology;  Laterality: Right;  [place] 00:21.0 AP% 4.7 CDE 1.00 Fluid Pack Lot # [id number]   GANGLION CYST EXCISION Right    SHOULDER SURGERY Bilateral 1988, 2001   TONSILLECTOMY     TRIGGER FINGER RELEASE Right     Allergies  Allergen Reactions   Azithromycin Shortness Of Breath    SOB abd pain diarrhea   Avelox [Moxifloxacin Hcl In Nacl] Hives  Ciprocin-Fluocin-Procin [Fluocinolone]    Penicillins Hives    Has patient had a PCN reaction causing immediate rash, facial/tongue/throat swelling, SOB or lightheadedness with hypotension: no Has patient had a PCN reaction causing severe rash involving mucus membranes or skin necrosis: no Has patient had a PCN reaction that required hospitalization: no Has patient had a PCN reaction occurring within the last 10 years: no If all of the above answers are "NO", then may proceed with Cephalosporin use.    Quinolones Hives    Outpatient Encounter Medications as of 01/27/2023  Medication Sig   acetaminophen (TYLENOL) 500 MG tablet Take 1,000 mg by mouth every 8 (eight) hours as needed.   atorvastatin (LIPITOR) 20 MG tablet Take 20 mg  by mouth daily.   cetirizine (ZYRTEC) 10 MG tablet Take 10 mg by mouth daily.   cholecalciferol (VITAMIN D) 1000 units tablet Take 3,000 Units by mouth daily.   metoprolol succinate (TOPROL-XL) 25 MG 24 hr tablet TAKE 1 TABLET(25 MG) BY MOUTH DAILY   montelukast (SINGULAIR) 10 MG tablet TAKE 1 TABLET(10 MG) BY MOUTH DAILY   Olopatadine HCl 0.7 % SOLN Place 1 drop into both eyes daily.   pantoprazole (PROTONIX) 40 MG tablet Take 1 tablet (40 mg total) by mouth daily.   Polyethyl Glycol-Propyl Glycol (SYSTANE OP) Apply 1 drop to eye as needed.   No facility-administered encounter medications on file as of 01/27/2023.    Review of Systems:  Review of Systems  Health Maintenance  Topic Date Due   Hepatitis C Screening  Never done   INFLUENZA VACCINE  12/08/2022   COVID-19 Vaccine (9 - 2023-24 season) 01/08/2023   Medicare Annual Wellness (AWV)  03/23/2023   DTaP/Tdap/Td (3 - Tdap) 02/17/2025   Pneumonia Vaccine 84+ Years old  Completed   DEXA SCAN  Completed   Zoster Vaccines- Shingrix  Completed   HPV VACCINES  Aged Out   Colonoscopy  Discontinued    Physical Exam: Vitals:   01/27/23 1351  BP: 126/84  Pulse: 82  Temp: 98.1 F (36.7 C)  SpO2: 96%  Weight: 135 lb (61.2 kg)  Height: 4\' 5"  (1.346 m)   Body mass index is 33.79 kg/m. Physical Exam  Labs reviewed: Basic Metabolic Panel: Recent Labs    09/12/22 0000  NA 139  K 4.2  CL 102  CO2 28*  BUN 26*  CREATININE 0.7  CALCIUM 9.6  TSH 3.42   Liver Function Tests: Recent Labs    09/12/22 0000  AST 13  ALT 11  ALKPHOS 64  ALBUMIN 4.4   No results for input(s): "LIPASE", "AMYLASE" in the last 8760 hours. No results for input(s): "AMMONIA" in the last 8760 hours. CBC: Recent Labs    09/12/22 0000  WBC 6.4  NEUTROABS 4,250.00  HGB 12.7  HCT 38  PLT 256   Lipid Panel: Recent Labs    09/12/22 0000  CHOL 208*  HDL 43  LDLCALC 116  TRIG 664*   Lab Results  Component Value Date   HGBA1C 6.4  09/12/2022    Procedures since last visit: No results found.  Assessment/Plan There are no diagnoses linked to this encounter.   Labs/tests ordered:  * No order type specified * Next appt:  Visit date not found

## 2023-02-06 ENCOUNTER — Ambulatory Visit
Admission: RE | Admit: 2023-02-06 | Discharge: 2023-02-06 | Disposition: A | Discharge status: Home / Self Care | Payer: PPO | Attending: Student | Admitting: Student

## 2023-02-06 ENCOUNTER — Ambulatory Visit
Admission: RE | Admit: 2023-02-06 | Discharge: 2023-02-06 | Disposition: A | Discharge status: Home / Self Care | Payer: PPO | Source: Ambulatory Visit | Attending: Student | Admitting: Student

## 2023-02-06 DIAGNOSIS — M47816 Spondylosis without myelopathy or radiculopathy, lumbar region: Secondary | ICD-10-CM | POA: Diagnosis not present

## 2023-02-06 DIAGNOSIS — M545 Low back pain, unspecified: Secondary | ICD-10-CM | POA: Diagnosis not present

## 2023-02-06 DIAGNOSIS — M461 Sacroiliitis, not elsewhere classified: Secondary | ICD-10-CM | POA: Diagnosis not present

## 2023-02-15 DIAGNOSIS — H26493 Other secondary cataract, bilateral: Secondary | ICD-10-CM | POA: Diagnosis not present

## 2023-02-15 DIAGNOSIS — G43101 Migraine with aura, not intractable, with status migrainosus: Secondary | ICD-10-CM | POA: Diagnosis not present

## 2023-02-15 DIAGNOSIS — Z961 Presence of intraocular lens: Secondary | ICD-10-CM | POA: Diagnosis not present

## 2023-02-20 ENCOUNTER — Ambulatory Visit: Payer: PPO | Admitting: Dietician

## 2023-03-03 ENCOUNTER — Ambulatory Visit: Payer: PPO | Admitting: Student

## 2023-03-03 ENCOUNTER — Encounter: Payer: Self-pay | Admitting: Student

## 2023-03-03 VITALS — BP 134/86 | HR 82 | Temp 97.9°F | Ht <= 58 in | Wt 133.0 lb

## 2023-03-03 DIAGNOSIS — I491 Atrial premature depolarization: Secondary | ICD-10-CM | POA: Insufficient documentation

## 2023-03-03 DIAGNOSIS — J4520 Mild intermittent asthma, uncomplicated: Secondary | ICD-10-CM | POA: Diagnosis not present

## 2023-03-03 DIAGNOSIS — M461 Sacroiliitis, not elsewhere classified: Secondary | ICD-10-CM

## 2023-03-03 DIAGNOSIS — E785 Hyperlipidemia, unspecified: Secondary | ICD-10-CM

## 2023-03-03 MED ORDER — MONTELUKAST SODIUM 10 MG PO TABS
ORAL_TABLET | ORAL | 1 refills | Status: DC
Start: 2023-03-03 — End: 2023-09-04

## 2023-03-03 MED ORDER — GABAPENTIN 100 MG PO CAPS
100.0000 mg | ORAL_CAPSULE | Freq: Every day | ORAL | 3 refills | Status: DC
Start: 2023-03-03 — End: 2023-04-12

## 2023-03-03 MED ORDER — ATORVASTATIN CALCIUM 20 MG PO TABS
20.0000 mg | ORAL_TABLET | Freq: Every day | ORAL | 1 refills | Status: DC
Start: 2023-03-03 — End: 2023-08-29

## 2023-03-03 MED ORDER — METOPROLOL SUCCINATE ER 25 MG PO TB24
ORAL_TABLET | ORAL | 1 refills | Status: DC
Start: 2023-03-03 — End: 2023-09-04

## 2023-03-03 NOTE — Progress Notes (Signed)
Location:  [facility].   Provider: Dr. Earnestine Mealing  Code Status: DNR Goals of Care:     03/03/2023    1:27 PM  Advanced Directives  Does Patient Have a Medical Advance Directive? Yes  Type of Estate agent of [place];Out of facility DNR (pink MOST or yellow form);Living will  Does patient want to make changes to medical advance directive? No - Patient declined  Copy of Healthcare Power of Attorney in Chart? Yes - validated most recent copy scanned in chart (See row information)     Chief Complaint  Patient presents with   Medical Management of Chronic Issues    Medical Management of Chronic Issues. 1 Month follow up   Quality Metric Gaps    To discuss need for Hep C Screening     HPI: Patient is a 77 y.o. female seen today for medical management of chronic diseases.    She has continued hip pain. She saw her Woodroe Chen results. She sleeps in a recliner. She walked here and feels a bit better tdoay. No clear answers with the x-ray; degenerative disc disease which has been there for many years.  States she should have an MRI for further characterization.  At this time she would like to defer an MRI.  She has some burning pain in the buttock area.   Salonpas doesn't do anything. She says that she can live with it, but it impacts her desire to keep walking. She doesn't really have radiation of pain. No electrical pain like sciatica. She has some decreased sensation in parts of her legs. It's been going   Discussed sensation versus motor injury of nerve with spinal stenosis. She isn't interested in MRI at this time.    Past Medical History:  Diagnosis Date   Chronic kidney disease    RENAL INSUFF   Complication of anesthesia    RESPIRATORY PROBLEMS WITH SHOULDER SURGERY   Dysrhythmia    Edema    FEET/ANKLES   Erosive esophagitis    Gallbladder polyp    GERD (gastroesophageal reflux disease)    Heart murmur    Hepatic hemangioma     Hepatitis    A IN PAST   History of hiatal hernia    Hyperlipidemia    Mild intermittent asthma    MVP (mitral valve prolapse)    with slight regurgitation   Orthopnea    Osteoporosis    Proteinuria    Pulmonary nodule 2000's   stable over years--no more testing   Sleep disturbance    chronic   Thyroid nodule     Past Surgical History:  Procedure Laterality Date   APPENDECTOMY     CARPAL TUNNEL RELEASE Bilateral 1987   CATARACT EXTRACTION W/PHACO Left 08/17/2017   Procedure: CATARACT EXTRACTION PHACO AND INTRAOCULAR LENS PLACEMENT (IOC);  Surgeon: Nevada Crane, MD;  Location: [facility];  Service: Ophthalmology;  Laterality: Left;  [place] 00:29.0 AP% 11.1 CDE 3.21 Fluid Pack lot # [id number]   CATARACT EXTRACTION W/PHACO Right 09/14/2017   Procedure: CATARACT EXTRACTION PHACO AND INTRAOCULAR LENS PLACEMENT (IOC);  Surgeon: Nevada Crane, MD;  Location: [facility];  Service: Ophthalmology;  Laterality: Right;  [place] 00:21.0 AP% 4.7 CDE 1.00 Fluid Pack Lot # [id number]   GANGLION CYST EXCISION Right    SHOULDER SURGERY Bilateral 1988, 2001   TONSILLECTOMY     TRIGGER FINGER RELEASE Right     Allergies  Allergen Reactions   Azithromycin Shortness Of Breath  SOB abd pain diarrhea   Avelox [Moxifloxacin Hcl In Nacl] Hives   Ciprocin-Fluocin-Procin [Fluocinolone]    Penicillins Hives    Has patient had a PCN reaction causing immediate rash, facial/tongue/throat swelling, SOB or lightheadedness with hypotension: no Has patient had a PCN reaction causing severe rash involving mucus membranes or skin necrosis: no Has patient had a PCN reaction that required hospitalization: no Has patient had a PCN reaction occurring within the last 10 years: no If all of the above answers are "NO", then may proceed with Cephalosporin use.    Quinolones Hives    Outpatient Encounter Medications as of 03/03/2023  Medication Sig   acetaminophen (TYLENOL) 500 MG tablet Take 1,000 mg by mouth  every 8 (eight) hours as needed.   atorvastatin (LIPITOR) 20 MG tablet Take 20 mg by mouth daily.   cetirizine (ZYRTEC) 10 MG tablet Take 10 mg by mouth daily.   cholecalciferol (VITAMIN D) 1000 units tablet Take 3,000 Units by mouth daily.   metoprolol succinate (TOPROL-XL) 25 MG 24 hr tablet TAKE 1 TABLET(25 MG) BY MOUTH DAILY   montelukast (SINGULAIR) 10 MG tablet TAKE 1 TABLET(10 MG) BY MOUTH DAILY   Olopatadine HCl 0.7 % SOLN Place 1 drop into both eyes daily.   pantoprazole (PROTONIX) 40 MG tablet Take 1 tablet (40 mg total) by mouth daily.   Polyethyl Glycol-Propyl Glycol (SYSTANE OP) Apply 1 drop to eye as needed.   No facility-administered encounter medications on file as of 03/03/2023.    Review of Systems:  Review of Systems  Health Maintenance  Topic Date Due   Hepatitis C Screening  Never done   Medicare Annual Wellness (AWV)  03/23/2023   COVID-19 Vaccine (10 - 2023-24 season) 03/31/2023   DTaP/Tdap/Td (3 - Tdap) 02/17/2025   Pneumonia Vaccine 46+ Years old  Completed   INFLUENZA VACCINE  Completed   DEXA SCAN  Completed   Zoster Vaccines- Shingrix  Completed   HPV VACCINES  Aged Out   Colonoscopy  Discontinued    Physical Exam: Vitals:   03/03/23 1325  BP: 134/86  Pulse: 82  Temp: 97.9 F (36.6 C)  SpO2: 97%  Weight: 133 lb (60.3 kg)  Height: 4\' 5"  (1.346 m)   Body mass index is 33.29 kg/m. Physical Exam Pulmonary:     Effort: Pulmonary effort is normal.  Neurological:     Mental Status: She is alert and oriented to person, place, and time.     Labs reviewed: Basic Metabolic Panel: Recent Labs    09/12/22 0000  NA 139  K 4.2  CL 102  CO2 28*  BUN 26*  CREATININE 0.7  CALCIUM 9.6  TSH 3.42   Liver Function Tests: Recent Labs    09/12/22 0000  AST 13  ALT 11  ALKPHOS 64  ALBUMIN 4.4   No results for input(s): "LIPASE", "AMYLASE" in the last 8760 hours. No results for input(s): "AMMONIA" in the last 8760 hours. CBC: Recent Labs     09/12/22 0000  WBC 6.4  NEUTROABS 4,250.00  HGB 12.7  HCT 38  PLT 256   Lipid Panel: Recent Labs    09/12/22 0000  CHOL 208*  HDL 43  LDLCALC 116  TRIG 063*   Lab Results  Component Value Date   HGBA1C 6.4 09/12/2022    Procedures since last visit: DG Lumbar Spine Complete  Result Date: 02/24/2023 CLINICAL DATA:  low back pain; continued pain of sacroilitis. EXAM: LUMBAR SPINE - COMPLETE 4+ VIEW; PELVIS -  1-2 VIEW COMPARISON:  None Available. FINDINGS: There are five non-rib bearing lumbar-type vertebral bodies. There is normal alignment. There is no evidence for acute fracture or subluxation. Intervertebral disc spaces are relatively preserved with minimal multilevel endplate proliferative changes. Symmetric mild degenerative changes sacroiliac joints. Visualized abdomen is unremarkable. IMPRESSION: 1. Minimal degenerative changes of the lumbar spine. 2. Mild degenerative changes of the sacroiliac joints. If further imaging is desired of the sacroiliac joints, this would be better assessed a dedicated pelvic MRI. Electronically Signed   By: Meda Klinefelter M.D.   On: 02/24/2023 16:55   DG Pelvis 1-2 Views  Result Date: 02/24/2023 CLINICAL DATA:  low back pain; continued pain of sacroilitis. EXAM: LUMBAR SPINE - COMPLETE 4+ VIEW; PELVIS - 1-2 VIEW COMPARISON:  None Available. FINDINGS: There are five non-rib bearing lumbar-type vertebral bodies. There is normal alignment. There is no evidence for acute fracture or subluxation. Intervertebral disc spaces are relatively preserved with minimal multilevel endplate proliferative changes. Symmetric mild degenerative changes sacroiliac joints. Visualized abdomen is unremarkable. IMPRESSION: 1. Minimal degenerative changes of the lumbar spine. 2. Mild degenerative changes of the sacroiliac joints. If further imaging is desired of the sacroiliac joints, this would be better assessed a dedicated pelvic MRI. Electronically Signed   By:  Meda Klinefelter M.D.   On: 02/24/2023 16:55    Assessment/Plan Hyperlipidemia, unspecified hyperlipidemia type - Plan: atorvastatin (LIPITOR) 20 MG tablet  Premature atrial contraction - Plan: metoprolol succinate (TOPROL-XL) 25 MG 24 hr tablet  Mild intermittent asthma without complication - Plan: montelukast (SINGULAIR) 10 MG tablet  Sacroiliitis (HCC) - Plan: gabapentin (NEURONTIN) 100 MG capsule With persistent pain with sacroiliitis.  Plan to trial gabapentin low-dose nightly to potentially help with sleep and pain.  Refills as outlined above.  Follow-up in 1 month to determine pain control.  Labs/tests ordered:  * No order type specified * Next appt:  Visit date not found

## 2023-03-03 NOTE — Patient Instructions (Signed)
Please take 100 mg nightly for your pain.

## 2023-04-12 ENCOUNTER — Encounter: Payer: Self-pay | Admitting: Student

## 2023-04-12 ENCOUNTER — Ambulatory Visit: Payer: PPO | Admitting: Student

## 2023-04-12 VITALS — BP 120/68 | HR 74 | Temp 98.7°F | Ht <= 58 in | Wt 130.0 lb

## 2023-04-12 DIAGNOSIS — R809 Proteinuria, unspecified: Secondary | ICD-10-CM | POA: Diagnosis not present

## 2023-04-12 DIAGNOSIS — R7303 Prediabetes: Secondary | ICD-10-CM

## 2023-04-12 DIAGNOSIS — M810 Age-related osteoporosis without current pathological fracture: Secondary | ICD-10-CM | POA: Diagnosis not present

## 2023-04-12 DIAGNOSIS — M461 Sacroiliitis, not elsewhere classified: Secondary | ICD-10-CM

## 2023-04-12 MED ORDER — GABAPENTIN 300 MG PO CAPS
300.0000 mg | ORAL_CAPSULE | Freq: Every day | ORAL | 1 refills | Status: DC
Start: 2023-04-12 — End: 2023-10-30

## 2023-04-12 NOTE — Patient Instructions (Addendum)
Take two gabapentin nightly until the end of your current prescription. I will refill your gabapentin at the 300 mg dosing. Continue taking nightly.   Please come for your labs Monday before your next appointment at 7:30AM she will come to your home for collection.

## 2023-04-12 NOTE — Progress Notes (Signed)
Location:  [facility].   Provider: Dr. Earnestine Mealing  Code Status: DNR Goals of Care:     04/12/2023    2:52 PM  Advanced Directives  Does Patient Have a Medical Advance Directive? Yes  Type of Estate agent of [place];Out of facility DNR (pink MOST or yellow form);Living will  Does patient want to make changes to medical advance directive? No - Patient declined  Copy of Healthcare Power of Attorney in Chart? Yes - validated most recent copy scanned in chart (See row information)     Chief Complaint  Patient presents with   Medical Management of Chronic Issues    Medical Management of Chronic Issues. 1 Month follow up   Quality Metric Gaps    To Discuss Covid, Hep C Screening and AWV    HPI: Patient is a 77 y.o. female seen today for medical management of chronic diseases.    She broat an imaging study from 2012 where she had an Mri and x-ray at the time.   She had a good thanksgiving.   Not much has changed for her pain. She continues to have the burning pain. She has more issues first thing in the morning.   She has lost 5 lbs in the last month. She has been eating less fruit and she has made less food in general. She cut back on things. She was taking more tea before and always has some sugar in her tea.  Prediabetes -  Past Medical History:  Diagnosis Date   Chronic kidney disease    RENAL INSUFF   Complication of anesthesia    RESPIRATORY PROBLEMS WITH SHOULDER SURGERY   Dysrhythmia    Edema    FEET/ANKLES   Erosive esophagitis    Gallbladder polyp    GERD (gastroesophageal reflux disease)    Heart murmur    Hepatic hemangioma    Hepatitis    A IN PAST   History of hiatal hernia    Hyperlipidemia    Mild intermittent asthma    MVP (mitral valve prolapse)    with slight regurgitation   Orthopnea    Osteoporosis    Proteinuria    Pulmonary nodule 2000's   stable over years--no more testing   Sleep disturbance     chronic   Thyroid nodule     Past Surgical History:  Procedure Laterality Date   APPENDECTOMY     CARPAL TUNNEL RELEASE Bilateral 1987   CATARACT EXTRACTION W/PHACO Left 08/17/2017   Procedure: CATARACT EXTRACTION PHACO AND INTRAOCULAR LENS PLACEMENT (IOC);  Surgeon: Nevada Crane, MD;  Location: [facility];  Service: Ophthalmology;  Laterality: Left;  [place] 00:29.0 AP% 11.1 CDE 3.21 Fluid Pack lot # [id number]   CATARACT EXTRACTION W/PHACO Right 09/14/2017   Procedure: CATARACT EXTRACTION PHACO AND INTRAOCULAR LENS PLACEMENT (IOC);  Surgeon: Nevada Crane, MD;  Location: [facility];  Service: Ophthalmology;  Laterality: Right;  [place] 00:21.0 AP% 4.7 CDE 1.00 Fluid Pack Lot # [id number]   GANGLION CYST EXCISION Right    SHOULDER SURGERY Bilateral 1988, 2001   TONSILLECTOMY     TRIGGER FINGER RELEASE Right     Allergies  Allergen Reactions   Azithromycin Shortness Of Breath    SOB abd pain diarrhea   Avelox [Moxifloxacin Hcl In Nacl] Hives   Ciprocin-Fluocin-Procin [Fluocinolone]    Penicillins Hives    Has patient had a PCN reaction causing immediate rash, facial/tongue/throat swelling, SOB or lightheadedness with hypotension: no Has  patient had a PCN reaction causing severe rash involving mucus membranes or skin necrosis: no Has patient had a PCN reaction that required hospitalization: no Has patient had a PCN reaction occurring within the last 10 years: no If all of the above answers are "NO", then may proceed with Cephalosporin use.    Quinolones Hives    Outpatient Encounter Medications as of 04/12/2023  Medication Sig   acetaminophen (TYLENOL) 500 MG tablet Take 1,000 mg by mouth every 8 (eight) hours as needed.   atorvastatin (LIPITOR) 20 MG tablet Take 1 tablet (20 mg total) by mouth daily.   cetirizine (ZYRTEC) 10 MG tablet Take 10 mg by mouth daily.   cholecalciferol (VITAMIN D) 1000 units tablet Take 3,000 Units by mouth daily.   gabapentin (NEURONTIN) 100 MG capsule  Take 1 capsule (100 mg total) by mouth at bedtime.   metoprolol succinate (TOPROL-XL) 25 MG 24 hr tablet TAKE 1 TABLET(25 MG) BY MOUTH DAILY   montelukast (SINGULAIR) 10 MG tablet TAKE 1 TABLET(10 MG) BY MOUTH DAILY   Olopatadine HCl 0.7 % SOLN Place 1 drop into both eyes daily.   pantoprazole (PROTONIX) 40 MG tablet Take 1 tablet (40 mg total) by mouth daily.   Polyethyl Glycol-Propyl Glycol (SYSTANE OP) Apply 1 drop to eye as needed.   No facility-administered encounter medications on file as of 04/12/2023.    Review of Systems:  Review of Systems  Health Maintenance  Topic Date Due   Hepatitis C Screening  Never done   Medicare Annual Wellness (AWV)  03/23/2023   COVID-19 Vaccine (10 - 2023-24 season) 03/31/2023   DTaP/Tdap/Td (3 - Tdap) 02/17/2025   Pneumonia Vaccine 39+ Years old  Completed   INFLUENZA VACCINE  Completed   DEXA SCAN  Completed   Zoster Vaccines- Shingrix  Completed   HPV VACCINES  Aged Out   Colonoscopy  Discontinued    Physical Exam: Vitals:   04/12/23 1451  BP: 120/68  Pulse: 74  Temp: 98.7 F (37.1 C)  SpO2: 97%  Weight: 130 lb (59 kg)  Height: 4\' 5"  (1.346 m)   Body mass index is 32.54 kg/m. Physical Exam Constitutional:      Appearance: Normal appearance.  Cardiovascular:     Rate and Rhythm: Normal rate and regular rhythm.     Pulses: Normal pulses.     Heart sounds: Normal heart sounds.  Pulmonary:     Effort: Pulmonary effort is normal.  Abdominal:     General: Abdomen is flat. Bowel sounds are normal.     Palpations: Abdomen is soft.  Musculoskeletal:        General: No swelling or tenderness.  Skin:    General: Skin is warm and dry.  Neurological:     Mental Status: She is alert and oriented to person, place, and time.     Gait: Gait normal.  Psychiatric:        Mood and Affect: Mood normal.     Labs reviewed: Basic Metabolic Panel: Recent Labs    09/12/22 0000  NA 139  K 4.2  CL 102  CO2 28*  BUN 26*  CREATININE  0.7  CALCIUM 9.6  TSH 3.42   Liver Function Tests: Recent Labs    09/12/22 0000  AST 13  ALT 11  ALKPHOS 64  ALBUMIN 4.4   No results for input(s): "LIPASE", "AMYLASE" in the last 8760 hours. No results for input(s): "AMMONIA" in the last 8760 hours. CBC: Recent Labs  09/12/22 0000  WBC 6.4  NEUTROABS 4,250.00  HGB 12.7  HCT 38  PLT 256   Lipid Panel: Recent Labs    09/12/22 0000  CHOL 208*  HDL 43  LDLCALC 116  TRIG 914*   Lab Results  Component Value Date   HGBA1C 6.4 09/12/2022    Procedures since last visit: No results found.  Assessment/Plan Osteoporosis without current pathological fracture, unspecified osteoporosis type - Plan: VITAMIN D 25 Hydroxy (Vit-D Deficiency, Fractures)  Sacroiliitis (HCC) - Plan: gabapentin (NEURONTIN) 300 MG capsule  Proteinuria, unspecified type - Plan: Microalbumin/Creatinine Ratio, Urine  Prediabetes - Plan: Complete Metabolic Panel with eGFR, Hemoglobin A1c, Lipid Panel Patient with hx of hip pain. Mildly improved with gabapentin. Mild arthritis present. Incraese gabapentin to 300 mg. Labs as outlined above for osteoporosis, proteinuria, and preDM.   Labs/tests ordered:  * No order type specified * Next appt:  Visit date not found

## 2023-04-27 ENCOUNTER — Encounter: Payer: Self-pay | Admitting: Nurse Practitioner

## 2023-04-27 ENCOUNTER — Ambulatory Visit: Payer: PPO | Admitting: Nurse Practitioner

## 2023-04-27 VITALS — BP 130/84 | HR 77 | Temp 97.9°F | Ht <= 58 in | Wt 130.0 lb

## 2023-04-27 DIAGNOSIS — E2839 Other primary ovarian failure: Secondary | ICD-10-CM

## 2023-04-27 DIAGNOSIS — Z1159 Encounter for screening for other viral diseases: Secondary | ICD-10-CM | POA: Diagnosis not present

## 2023-04-27 DIAGNOSIS — Z0000 Encounter for general adult medical examination without abnormal findings: Secondary | ICD-10-CM | POA: Diagnosis not present

## 2023-04-27 NOTE — Progress Notes (Signed)
Subjective:   Shelby Oliver is a 77 y.o. female who presents for Medicare Annual (Subsequent) preventive examination.  Visit Complete: In person at [facility]  Cardiac Risk Factors include: sedentary lifestyle;advanced age (>47men, >22 women);obesity (BMI >30kg/m2);dyslipidemia     Objective:    Today's Vitals   04/27/23 1051 04/27/23 1112  BP: 130/84   Pulse: 77   Temp: 97.9 F (36.6 C)   SpO2: 98%   Weight: 130 lb (59 kg)   Height: 4\' 5"  (1.346 m)   PainSc:  6    Body mass index is 32.54 kg/m.     04/27/2023   10:53 AM 04/12/2023    2:52 PM 03/03/2023    1:27 PM 01/27/2023    1:54 PM 11/08/2022    2:01 PM 10/26/2022    1:29 PM 09/14/2022    1:07 PM  Advanced Directives  Does Patient Have a Medical Advance Directive? Yes Yes Yes Yes Yes Yes Yes  Type of Estate agent of [place];Out of facility DNR (pink MOST or yellow form);Living will Healthcare Power of [place];Out of facility DNR (pink MOST or yellow form);Living will Healthcare Power of [place];Out of facility DNR (pink MOST or yellow form);Living will Healthcare Power of [place];Living will;Out of facility DNR (pink MOST or yellow form)  Healthcare Power of [place];Out of facility DNR (pink MOST or yellow form);Living will Healthcare Power of [place];Living will;Out of facility DNR (pink MOST or yellow form)  Does patient want to make changes to medical advance directive? No - Patient declined No - Patient declined No - Patient declined No - Patient declined No - Patient declined No - Patient declined No - Patient declined  Copy of Healthcare Power of Attorney in Chart? Yes - validated most recent copy scanned in chart (See row information) Yes - validated most recent copy scanned in chart (See row information) Yes - validated most recent copy scanned in chart (See row information) Yes - validated most recent copy scanned in chart (See row information)  Yes - validated most recent copy scanned in  chart (See row information) Yes - validated most recent copy scanned in chart (See row information)    Current Medications (verified) Outpatient Encounter Medications as of 04/27/2023  Medication Sig   acetaminophen (TYLENOL) 500 MG tablet Take 1,000 mg by mouth every 8 (eight) hours as needed.   atorvastatin (LIPITOR) 20 MG tablet Take 1 tablet (20 mg total) by mouth daily.   cetirizine (ZYRTEC) 10 MG tablet Take 10 mg by mouth daily.   cholecalciferol (VITAMIN D) 1000 units tablet Take 3,000 Units by mouth daily.   gabapentin (NEURONTIN) 300 MG capsule Take 1 capsule (300 mg total) by mouth at bedtime.   metoprolol succinate (TOPROL-XL) 25 MG 24 hr tablet TAKE 1 TABLET(25 MG) BY MOUTH DAILY   montelukast (SINGULAIR) 10 MG tablet TAKE 1 TABLET(10 MG) BY MOUTH DAILY   Olopatadine HCl 0.7 % SOLN Place 1 drop into both eyes daily.   pantoprazole (PROTONIX) 40 MG tablet Take 1 tablet (40 mg total) by mouth daily.   Polyethyl Glycol-Propyl Glycol (SYSTANE OP) Apply 1 drop to eye as needed.   No facility-administered encounter medications on file as of 04/27/2023.    Allergies (verified) Azithromycin, Avelox [moxifloxacin hcl in nacl], Ciprocin-fluocin-procin [fluocinolone], Penicillins, and Quinolones   History: Past Medical History:  Diagnosis Date   Chronic kidney disease    RENAL INSUFF   Complication of anesthesia    RESPIRATORY PROBLEMS WITH SHOULDER SURGERY  Dysrhythmia    Edema    FEET/ANKLES   Erosive esophagitis    Gallbladder polyp    GERD (gastroesophageal reflux disease)    Heart murmur    Hepatic hemangioma    Hepatitis    A IN PAST   History of hiatal hernia    Hyperlipidemia    Mild intermittent asthma    MVP (mitral valve prolapse)    with slight regurgitation   Orthopnea    Osteoporosis    Proteinuria    Pulmonary nodule 2000's   stable over years--no more testing   Sleep disturbance    chronic   Thyroid nodule    Past Surgical History:  Procedure  Laterality Date   APPENDECTOMY     CARPAL TUNNEL RELEASE Bilateral 1987   CATARACT EXTRACTION W/PHACO Left 08/17/2017   Procedure: CATARACT EXTRACTION PHACO AND INTRAOCULAR LENS PLACEMENT (IOC);  Surgeon: Nevada Crane, MD;  Location: [facility] ORS;  Service: Ophthalmology;  Laterality: Left;  [place] 00:29.0 AP% 11.1 CDE 3.21 Fluid Pack lot # [id number]   CATARACT EXTRACTION W/PHACO Right 09/14/2017   Procedure: CATARACT EXTRACTION PHACO AND INTRAOCULAR LENS PLACEMENT (IOC);  Surgeon: Nevada Crane, MD;  Location: [facility] ORS;  Service: Ophthalmology;  Laterality: Right;  [place] 00:21.0 AP% 4.7 CDE 1.00 Fluid Pack Lot # [id number]   GANGLION CYST EXCISION Right    SHOULDER SURGERY Bilateral 1988, 2001   TONSILLECTOMY     TRIGGER FINGER RELEASE Right    Family History  Problem Relation Age of Onset   Hyperlipidemia Mother    Arthritis Mother    Hypertension Mother    Kidney cancer Mother    Stroke Father        hemorrhagic   Arthritis Father    Heart disease Sister        mitral valve repair   Hypertension Sister    Congestive Heart Failure Sister    Diabetes Neg Hx    Colon cancer Neg Hx    Esophageal cancer Neg Hx    Rectal cancer Neg Hx    Stomach cancer Neg Hx    Social History   Socioeconomic History   Marital status: Single    Spouse name: Not on file   Number of children: 0   Years of education: Not on file   Highest education level: Bachelor's degree (e.g., BA, AB, BS)  Occupational History   Occupation: Astronomer    Comment: Retired   Occupation: retired  Tobacco Use   Smoking status: Never    Passive exposure: Past   Smokeless tobacco: Never  Vaping Use   Vaping status: Never Used  Substance and Sexual Activity   Alcohol use: No   Drug use: No   Sexual activity: Not on file  Other Topics Concern   Not on file  Social History Narrative   Wants to be called [name]   Has living will   Niece is her health care POA----Olivia Margo Aye,    Requests DNR--done 03/16/21   No tube feeds if cognitively unaware   Social Drivers of Health   Financial Resource Strain: Low Risk  (02/28/2023)   Overall Financial Resource Strain (CARDIA)    Difficulty of Paying Living Expenses: Not hard at all  Food Insecurity: No Food Insecurity (02/28/2023)   Hunger Vital Sign    Worried About Running Out of Food in the Last Year: Never true    Ran Out of Food in the Last Year: Never true  Transportation Needs: No Transportation Needs (02/28/2023)   PRAPARE - Administrator, Civil Service (Medical): No    Lack of Transportation (Non-Medical): No  Physical Activity: Insufficiently Active (02/28/2023)   Exercise Vital Sign    Days of Exercise per Week: 3 days    Minutes of Exercise per Session: 20 min  Stress: No Stress Concern Present (02/28/2023)   [employer] of Occupational Health - Occupational Stress Questionnaire    Feeling of Stress : Only a little  Social Connections: Moderately Integrated (02/28/2023)   Social Connection and Isolation Panel [NHANES]    Frequency of Communication with Friends and Family: Three times a week    Frequency of Social Gatherings with Friends and Family: Twice a week    Attends Religious Services: More than 4 times per year    Active Member of [employer] or Organizations: Yes    Attends Engineer, structural: More than 4 times per year    Marital Status: Never married    Tobacco Counseling Counseling given: Not Answered   Clinical Intake:  Pre-visit preparation completed: Yes  Pain : 0-10 Pain Score: 6  Pain Type: Chronic pain Pain Location: Back Pain Orientation: Right     BMI - recorded: 32 Nutritional Status: BMI > 30  Obese Diabetes: No  How often do you need to have someone help you when you read instructions, pamphlets, or other written materials from your doctor or pharmacy?: 1 - Never         Activities of Daily Living    04/27/2023   11:06 AM 04/24/2023    10:35 AM  In your present state of health, do you have any difficulty performing the following activities:  Hearing? 0 0  Vision? 0 0  Difficulty concentrating or making decisions? 0 0  Walking or climbing stairs? 1 0  Comment difficulty walking due pain   Dressing or bathing? 0 0  Doing errands, shopping? 0 0  Preparing Food and eating ? N N  Using the Toilet? N N  In the past six months, have you accidently leaked urine? N N  Do you have problems with loss of bowel control? N N  Managing your Medications? N N  Managing your Finances? N N  Housekeeping or managing your Housekeeping? N N    Patient Care Team: Earnestine Mealing, MD as PCP - General (Family Medicine)  Indicate any recent Medical Services you may have received from other than [facility] providers in the past year (date may be approximate).     Assessment:   This is a routine wellness examination for [place].  Hearing/Vision screen No results found.   Goals Addressed   None    Depression Screen    04/27/2023   10:53 AM 01/27/2023    1:54 PM 11/08/2022    2:01 PM 09/14/2022    1:08 PM 08/15/2022    1:47 PM 08/15/2022   12:50 PM 03/22/2022   10:59 AM  PHQ 2/9 Scores  PHQ - 2 Score 0 0 0 0 3 0 0  PHQ- 9 Score    0 12      Fall Risk    04/27/2023   10:53 AM 04/24/2023   10:35 AM 01/27/2023    1:53 PM 11/08/2022    2:01 PM 09/14/2022    1:08 PM  Fall Risk   Falls in the past year? 0 0 0 0 0  Number falls in past yr: 0  0  0  Injury with  Fall? 0  0  0    MEDICARE RISK AT HOME: Medicare Risk at Home Any stairs in or around the home?: Yes If so, are there any without handrails?: No Home free of loose throw rugs in walkways, pet beds, electrical cords, etc?: Yes Adequate lighting in your home to reduce risk of falls?: Yes Life alert?: No Use of a cane, walker or w/c?: No Grab bars in the bathroom?: Yes Shower chair or bench in shower?: No Elevated toilet seat or a handicapped toilet?: No  TIMED UP AND  GO:  Was the test performed?  No    Cognitive Function:    04/27/2023   10:54 AM  MMSE - Mini Mental State Exam  Orientation to time 5  Orientation to Place 5  Registration 3  Attention/ Calculation 5  Recall 3  Language- name 2 objects 2  Language- repeat 1  Language- follow 3 step command 3  Language- read & follow direction 1  Write a sentence 1  Copy design 1  Total score 30        Immunizations Immunization History  Administered Date(s) Administered   Influenza, High Dose Seasonal PF 01/26/2019   Influenza,inj,Quad PF,6+ Mos 02/18/2015, 02/24/2016, 03/01/2017   Influenza-Unspecified 02/06/2013, 02/22/2018, 01/18/2020, 02/18/2021, 02/06/2022, 03/02/2023   Moderna Covid-19 Fall Seasonal Vaccine 68yrs & older 08/16/2022   Moderna Covid-19 Vaccine Bivalent Booster 56yrs & up 10/05/2021, 02/03/2023   Moderna Sars-Covid-2 Vaccination 05/23/2019, 06/20/2019, 03/20/2020, 09/24/2020, 03/18/2022   Pfizer Covid-19 Vaccine Bivalent Booster 40yrs & up 01/28/2021   Pneumococcal Conjugate-13 06/28/2013   Pneumococcal Polysaccharide-23 02/03/2011, 03/01/2017   Td 01/14/2004, 02/18/2015   Zoster Recombinant(Shingrix) 03/09/2018, 07/28/2018   Zoster, Live 05/15/2009    TDAP status: Up to date  Flu Vaccine status: Up to date  Pneumococcal vaccine status: Up to date  Covid-19 vaccine status: Information provided on how to obtain vaccines.   Qualifies for Shingles Vaccine? Yes   Zostavax completed No   Shingrix Completed?: Yes  Screening Tests Health Maintenance  Topic Date Due   Hepatitis C Screening  Never done   COVID-19 Vaccine (10 - 2024-25 season) 03/31/2023   Medicare Annual Wellness (AWV)  04/26/2024   DTaP/Tdap/Td (3 - Tdap) 02/17/2025   Pneumonia Vaccine 67+ Years old  Completed   INFLUENZA VACCINE  Completed   DEXA SCAN  Completed   Zoster Vaccines- Shingrix  Completed   HPV VACCINES  Aged Out   Colonoscopy  Discontinued    Health Maintenance  Health  Maintenance Due  Topic Date Due   Hepatitis C Screening  Never done   COVID-19 Vaccine (10 - 2024-25 season) 03/31/2023    Colorectal cancer screening: No longer required.   Mammogram status: No longer required due to age.  Bone Density status: Ordered today. Pt provided with contact info and advised to call to schedule appt.  Lung Cancer Screening: (Low Dose CT Chest recommended if Age 7-80 years, 20 pack-year currently smoking OR have quit w/in 15years.) does not qualify.   Lung Cancer Screening Referral: na  Additional Screening:  Hepatitis C Screening: does qualify; Comple  Vision Screening: Recommended annual ophthalmology exams for early detection of glaucoma and other disorders of the eye. Is the patient up to date with their annual eye exam?  Yes  Who is the provider or what is the name of the office in which the patient attends annual eye exams? [facility] If pt is not established with a provider, would they like to be referred to  a provider to establish care? No .   Dental Screening: Recommended annual dental exams for proper oral hygiene  Community Resource Referral / Chronic Care Management: CRR required this visit?  No   CCM required this visit?  No     Plan:     I have personally reviewed and noted the following in the patient's chart:   Medical and social history Use of alcohol, tobacco or illicit drugs  Current medications and supplements including opioid prescriptions. Patient is not currently taking opioid prescriptions. Functional ability and status Nutritional status Physical activity Advanced directives List of other physicians Hospitalizations, surgeries, and ER visits in previous 12 months Vitals Screenings to include cognitive, depression, and falls Referrals and appointments  In addition, I have reviewed and discussed with patient certain preventive protocols, quality metrics, and best practice recommendations. A written personalized  care plan for preventive services as well as general preventive health recommendations were provided to patient.     Sharon Seller, NP   04/27/2023

## 2023-04-27 NOTE — Patient Instructions (Signed)
  Ms. Kierstead , Thank you for taking time to come for your Medicare Wellness Visit. I appreciate your ongoing commitment to your health goals. Please review the following plan we discussed and let me know if I can assist you in the future.     This is a list of the screening recommended for you and due dates:  Health Maintenance  Topic Date Due   Hepatitis C Screening  Never done   COVID-19 Vaccine (10 - 2024-25 season) 03/31/2023   Medicare Annual Wellness Visit  04/26/2024   DTaP/Tdap/Td vaccine (3 - Tdap) 02/17/2025   Pneumonia Vaccine  Completed   Flu Shot  Completed   DEXA scan (bone density measurement)  Completed   Zoster (Shingles) Vaccine  Completed   HPV Vaccine  Aged Out   Colon Cancer Screening  Discontinued

## 2023-05-01 DIAGNOSIS — R809 Proteinuria, unspecified: Secondary | ICD-10-CM | POA: Diagnosis not present

## 2023-05-01 DIAGNOSIS — R7303 Prediabetes: Secondary | ICD-10-CM | POA: Diagnosis not present

## 2023-05-01 DIAGNOSIS — E785 Hyperlipidemia, unspecified: Secondary | ICD-10-CM | POA: Diagnosis not present

## 2023-05-01 DIAGNOSIS — M810 Age-related osteoporosis without current pathological fracture: Secondary | ICD-10-CM | POA: Diagnosis not present

## 2023-05-02 LAB — LIPID PANEL
Cholesterol: 165 mg/dL (ref <200)
HDL: 40 mg/dL — ABNORMAL LOW (ref >=50)
LDL Cholesterol (Calc): 87 mg/dL
Non-HDL Cholesterol (Calc): 125 mg/dL (ref <130)
Total CHOL/HDL Ratio: 4.1 (calc) (ref <5.0)
Triglycerides: 318 mg/dL — ABNORMAL HIGH (ref <150)

## 2023-05-02 LAB — COMPLETE METABOLIC PANEL WITH GFR
AG Ratio: 1.4 (calc) (ref 1.0–2.5)
ALT: 18 U/L (ref 6–29)
AST: 15 U/L (ref 10–35)
Albumin: 4.2 g/dL (ref 3.6–5.1)
Alkaline phosphatase (APISO): 52 U/L (ref 37–153)
BUN/Creatinine Ratio: 37 (calc) — ABNORMAL HIGH (ref 6–22)
BUN: 26 mg/dL — ABNORMAL HIGH (ref 7–25)
CO2: 30 mmol/L (ref 20–32)
Calcium: 9.4 mg/dL (ref 8.6–10.4)
Chloride: 102 mmol/L (ref 98–110)
Creat: 0.7 mg/dL (ref 0.60–1.00)
Globulin: 3.1 g/dL (ref 1.9–3.7)
Glucose, Bld: 111 mg/dL — ABNORMAL HIGH (ref 65–99)
Potassium: 4 mmol/L (ref 3.5–5.3)
Sodium: 138 mmol/L (ref 135–146)
Total Bilirubin: 0.6 mg/dL (ref 0.2–1.2)
Total Protein: 7.3 g/dL (ref 6.1–8.1)
eGFR: 89 mL/min/{1.73_m2} (ref >=60)

## 2023-05-02 LAB — HEMOGLOBIN A1C
Hgb A1c MFr Bld: 6 %{Hb} — ABNORMAL HIGH (ref <5.7)
Mean Plasma Glucose: 126 mg/dL
eAG (mmol/L): 7 mmol/L

## 2023-05-02 LAB — VITAMIN D 25 HYDROXY (VIT D DEFICIENCY, FRACTURES): Vit D, 25-Hydroxy: 50 ng/mL (ref 30–100)

## 2023-05-04 ENCOUNTER — Ambulatory Visit: Payer: PPO | Admitting: Internal Medicine

## 2023-05-08 DIAGNOSIS — R7303 Prediabetes: Secondary | ICD-10-CM | POA: Diagnosis not present

## 2023-05-08 DIAGNOSIS — M810 Age-related osteoporosis without current pathological fracture: Secondary | ICD-10-CM | POA: Diagnosis not present

## 2023-05-08 DIAGNOSIS — R809 Proteinuria, unspecified: Secondary | ICD-10-CM | POA: Diagnosis not present

## 2023-05-11 LAB — MICROALBUMIN / CREATININE URINE RATIO
Creatinine, Urine: 44 mg/dL (ref 20–275)
Microalb Creat Ratio: 9 mg/g{creat} (ref <30)
Microalb, Ur: 0.4 mg/dL

## 2023-05-11 LAB — COMPLETE METABOLIC PANEL WITH GFR

## 2023-05-11 LAB — LIPID PANEL

## 2023-05-11 LAB — HEMOGLOBIN A1C

## 2023-05-11 LAB — VITAMIN D 25 HYDROXY (VIT D DEFICIENCY, FRACTURES)

## 2023-05-17 ENCOUNTER — Encounter: Payer: Self-pay | Admitting: Student

## 2023-05-17 ENCOUNTER — Ambulatory Visit: Payer: PPO | Admitting: Student

## 2023-05-17 VITALS — BP 138/86 | HR 92 | Temp 97.5°F | Ht <= 58 in | Wt 131.0 lb

## 2023-05-17 DIAGNOSIS — I341 Nonrheumatic mitral (valve) prolapse: Secondary | ICD-10-CM | POA: Diagnosis not present

## 2023-05-17 DIAGNOSIS — M810 Age-related osteoporosis without current pathological fracture: Secondary | ICD-10-CM | POA: Diagnosis not present

## 2023-05-17 DIAGNOSIS — E785 Hyperlipidemia, unspecified: Secondary | ICD-10-CM | POA: Diagnosis not present

## 2023-05-17 DIAGNOSIS — R809 Proteinuria, unspecified: Secondary | ICD-10-CM

## 2023-05-17 DIAGNOSIS — R7303 Prediabetes: Secondary | ICD-10-CM | POA: Diagnosis not present

## 2023-05-17 DIAGNOSIS — M461 Sacroiliitis, not elsewhere classified: Secondary | ICD-10-CM

## 2023-05-17 NOTE — Patient Instructions (Addendum)
 Please Call [phone] at  [facility] at [facility] to have your DEXA scan scheduled.   I've ordered an echocardiogram to evaluate your mitral valve, please schedule at your soonest convenience.

## 2023-05-17 NOTE — Progress Notes (Signed)
 Location:  [facility].   Provider: Dr. Richerd Brigham  Code Status: DNR Goals of Care:     05/17/2023    2:58 PM  Advanced Directives  Does Patient Have a Medical Advance Directive? Yes  Type of Estate Agent of [place];Out of facility DNR (pink MOST or yellow form);Living will  Does patient want to make changes to medical advance directive? No - Patient declined  Copy of Healthcare Power of Attorney in Chart? Yes - validated most recent copy scanned in chart (See row information)     Chief Complaint  Patient presents with   Medical Management of Chronic Issues    Medical Management of Chronic Issues. 6 Week Follow up    HPI: Patient is a 78 y.o. female seen today for medical management of chronic diseases.   Discussed the use of AI scribe software for clinical note transcription with the patient, who gave verbal consent to proceed.  History of Present Illness   The patient, with a history of osteoporosis, presents with concerns about her bone density. She was previously on Actonel and alendronate for osteoporosis treatment and estradiol for hormone therapy. The patient reports an improvement in bone density over time, moving from osteoporosis to osteopenia range. She expresses a desire to know her current bone density status.  The patient also reports a history of high cholesterol and triglycerides. She is currently on atorvastatin  20mg , which has helped to lower her total cholesterol to 165. However, her triglycerides remain elevated at 318, despite a decrease from previous levels.  The patient experiences numbness and a burning sensation in her left thigh, which she describes as a chronic issue. She reports that the symptoms worsen after standing for extended periods or lifting heavy objects. The patient is currently on gabapentin  for these neuropathic symptoms but reports inconsistent relief.  The patient also mentions occasional heart  flutters, which she describes as a sensation of a vice squeezing her legs. She reports a history of atrial premature beats (APBs) and mitral valve prolapse, but has not had these evaluated in several years.  The patient also mentions a history of protein in her urine and kidney issues, but recent tests show normal protein levels in her urine.       Past Medical History:  Diagnosis Date   Chronic kidney disease    RENAL INSUFF   Complication of anesthesia    RESPIRATORY PROBLEMS WITH SHOULDER SURGERY   Dysrhythmia    Edema    FEET/ANKLES   Erosive esophagitis    Gallbladder polyp    GERD (gastroesophageal reflux disease)    Heart murmur    Hepatic hemangioma    Hepatitis    A IN PAST   History of hiatal hernia    Hyperlipidemia    Mild intermittent asthma    MVP (mitral valve prolapse)    with slight regurgitation   Orthopnea    Osteoporosis    Proteinuria    Pulmonary nodule 2000's   stable over years--no more testing   Sleep disturbance    chronic   Thyroid  nodule     Past Surgical History:  Procedure Laterality Date   APPENDECTOMY     CARPAL TUNNEL RELEASE Bilateral 1987   CATARACT EXTRACTION W/PHACO Left 08/17/2017   Procedure: CATARACT EXTRACTION PHACO AND INTRAOCULAR LENS PLACEMENT (IOC);  Surgeon: Myrna Adine Anes, MD;  Location: [facility] ORS;  Service: Ophthalmology;  Laterality: Left;  [place]  00:29.0 AP% 11.1 CDE 3.21 Fluid Pack lot #  [id number]   CATARACT EXTRACTION W/PHACO Right 09/14/2017   Procedure: CATARACT EXTRACTION PHACO AND INTRAOCULAR LENS PLACEMENT (IOC);  Surgeon: Myrna Adine Anes, MD;  Location: [facility] ORS;  Service: Ophthalmology;  Laterality: Right;  US  00:21.0 AP% 4.7 CDE 1.00 Fluid Pack Lot # [id number]   GANGLION CYST EXCISION Right    SHOULDER SURGERY Bilateral 1988, 2001   TONSILLECTOMY     TRIGGER FINGER RELEASE Right     Allergies  Allergen Reactions   Azithromycin  Shortness Of Breath    SOB abd pain diarrhea   Avelox  [Moxifloxacin  Hcl In  Nacl] Hives   Ciprocin-Fluocin-Procin [Fluocinolone]    Penicillins Hives    Has patient had a PCN reaction causing immediate rash, facial/tongue/throat swelling, SOB or lightheadedness with hypotension: no Has patient had a PCN reaction causing severe rash involving mucus membranes or skin necrosis: no Has patient had a PCN reaction that required hospitalization: no Has patient had a PCN reaction occurring within the last 10 years: no If all of the above answers are NO, then may proceed with Cephalosporin use.    Quinolones Hives    Outpatient Encounter Medications as of 05/17/2023  Medication Sig   acetaminophen (TYLENOL) 500 MG tablet Take 1,000 mg by mouth every 8 (eight) hours as needed.   atorvastatin  (LIPITOR) 20 MG tablet Take 1 tablet (20 mg total) by mouth daily.   cetirizine (ZYRTEC) 10 MG tablet Take 10 mg by mouth daily.   cholecalciferol (VITAMIN D ) 1000 units tablet Take 3,000 Units by mouth daily.   gabapentin  (NEURONTIN ) 300 MG capsule Take 1 capsule (300 mg total) by mouth at bedtime.   metoprolol  succinate (TOPROL -XL) 25 MG 24 hr tablet TAKE 1 TABLET(25 MG) BY MOUTH DAILY   montelukast  (SINGULAIR ) 10 MG tablet TAKE 1 TABLET(10 MG) BY MOUTH DAILY   Olopatadine HCl 0.7 % SOLN Place 1 drop into both eyes daily.   pantoprazole  (PROTONIX ) 40 MG tablet Take 1 tablet (40 mg total) by mouth daily.   Polyethyl Glycol-Propyl Glycol (SYSTANE OP) Apply 1 drop to eye as needed.   No facility-administered encounter medications on file as of 05/17/2023.    Review of Systems:  Review of Systems  Health Maintenance  Topic Date Due   Hepatitis C Screening  Never done   COVID-19 Vaccine (10 - 2024-25 season) 03/31/2023   Medicare Annual Wellness (AWV)  04/26/2024   DTaP/Tdap/Td (3 - Tdap) 02/17/2025   Pneumonia Vaccine 70+ Years old  Completed   INFLUENZA VACCINE  Completed   DEXA SCAN  Completed   Zoster Vaccines- Shingrix  Completed   HPV VACCINES  Aged Out   Colonoscopy   Discontinued    Physical Exam: Vitals:   05/17/23 1456  BP: 138/86  Pulse: 92  Temp: (!) 97.5 F (36.4 C)  SpO2: 100%  Weight: 131 lb (59.4 kg)  Height: 4' 5 (1.346 m)   Body mass index is 32.79 kg/m. Physical Exam Physical Exam           Labs reviewed: Basic Metabolic Panel: Recent Labs    09/12/22 0000 05/01/23 0736 05/08/23 0900  NA 139 138  --   K 4.2 4.0  --   CL 102 102  --   CO2 28* 30  --   GLUCOSE  --  111* CANCELED  BUN 26* 26*  --   CREATININE 0.7 0.70  --   CALCIUM  9.6 9.4  --   TSH 3.42  --   --    Liver Function  Tests: Recent Labs    09/12/22 0000 05/01/23 0736  AST 13 15  ALT 11 18  ALKPHOS 64  --   BILITOT  --  0.6  PROT  --  7.3  ALBUMIN 4.4  --    No results for input(s): LIPASE, AMYLASE in the last 8760 hours. No results for input(s): AMMONIA in the last 8760 hours. CBC: Recent Labs    09/12/22 0000  WBC 6.4  NEUTROABS 4,250.00  HGB 12.7  HCT 38  PLT 256   Lipid Panel: Recent Labs    09/12/22 0000 05/01/23 0736 05/08/23 0900  CHOL 208* 165  --   HDL 43 40*  --   LDLCALC 116 87 CANCELED  TRIG 340* 318*  --   CHOLHDL  --  4.1  --    Lab Results  Component Value Date   HGBA1C CANCELED 05/08/2023    Procedures since last visit: No results found. Results   LABS Glucose: 111 mg/dL (87/76/7975) BUN: 26 mg/dL (87/76/7975) Creatinine: 0.7 mg/dL (87/76/7975) EGFR: 80 fO/fpw/8.26f (05/01/2023) Sodium: 138 mmol/L (05/01/2023) Potassium: 4.0 mmol/L (05/01/2023) Chloride: 102 mmol/L (05/01/2023) Carbon Dioxide: 30 mmol/L (05/01/2023) Calcium : 9.4 mg/dL (87/76/7975) Albumin: 4.2 g/dL (87/76/7975) AST: 15 U/L (05/01/2023) ALT: 18 U/L (05/01/2023) A1c: 6.0% (05/01/2023) Total Cholesterol: 165 mg/dL (87/76/7975) HDL: 40 mg/dL (87/76/7975) Triglycerides: 318 mg/dL (87/76/7975) LDL: 87 mg/dL (87/76/7975)      Assessment/Plan Assessment and Plan    Osteopenia History of osteoporosis treated with Actonel  and alendronate, now improved to osteopenia range. No recent fractures reported. -Schedule DEXA scan at [facility] to assess current bone density.  Hypertension No current proteinuria, previous history of proteinuria and renal scarring. Creatinine and eGFR within normal limits. -Continue current management.  Hyperlipidemia Total cholesterol 165, HDL 40, triglycerides 318, LDL 87. Currently on atorvastatin  20mg . Discussed potential addition of Zetia, but patient had previous negative experience. -Continue atorvastatin  20mg . -Recheck lipid panel in 3 months.  Prediabetes A1c decreased from 6.4 to 6.0. -Continue current management. -Recheck A1c in 6 months.  Vitamin D  sufficiency Current level within normal range, patient taking 3000 units daily. -Reduce vitamin D  supplementation to 2000 units daily.  Lower back pain and numbness Chronic symptoms, possibly related to spinal stenosis. Gabapentin  not providing significant relief. -Consider MRI to assess for spinal stenosis. -Consider surgical consultation if MRI confirms stenosis.  Edema 1+ pitting edema in lower extremities. -Encourage use of compression stockings.  Cardiac murmur Family history of mitral valve issues and Noonan syndrome. Patient reports occasional palpitations. -Order echocardiogram and Holter monitor to assess cardiac function and rhythm.  Follow-up in 3 months or sooner if any changes in symptoms.        Labs/tests ordered:  * No order type specified * Next appt:  Visit date not found

## 2023-05-23 ENCOUNTER — Ambulatory Visit
Admission: RE | Admit: 2023-05-23 | Discharge: 2023-05-23 | Disposition: A | Discharge status: Home / Self Care | Payer: PPO | Source: Ambulatory Visit | Attending: Nurse Practitioner | Admitting: Nurse Practitioner

## 2023-05-23 DIAGNOSIS — Z780 Asymptomatic menopausal state: Secondary | ICD-10-CM | POA: Diagnosis not present

## 2023-05-23 DIAGNOSIS — M810 Age-related osteoporosis without current pathological fracture: Secondary | ICD-10-CM | POA: Diagnosis not present

## 2023-05-23 DIAGNOSIS — E2839 Other primary ovarian failure: Secondary | ICD-10-CM | POA: Insufficient documentation

## 2023-06-09 ENCOUNTER — Ambulatory Visit
Admission: RE | Admit: 2023-06-09 | Discharge: 2023-06-09 | Disposition: A | Discharge status: Home / Self Care | Payer: PPO | Source: Ambulatory Visit | Attending: Student | Admitting: Student

## 2023-06-09 DIAGNOSIS — E785 Hyperlipidemia, unspecified: Secondary | ICD-10-CM | POA: Diagnosis not present

## 2023-06-09 DIAGNOSIS — I341 Nonrheumatic mitral (valve) prolapse: Secondary | ICD-10-CM | POA: Diagnosis not present

## 2023-06-09 DIAGNOSIS — I080 Rheumatic disorders of both mitral and aortic valves: Secondary | ICD-10-CM | POA: Diagnosis not present

## 2023-06-09 LAB — ECHOCARDIOGRAM COMPLETE
AR max vel: 2.56 cm2
AV Area VTI: 2.64 cm2
AV Area mean vel: 2.31 cm2
AV Mean grad: 4.5 mm[Hg]
AV Peak grad: 7.2 mm[Hg]
Ao pk vel: 1.34 m/s
MV M vel: 4.64 m/s
MV Peak grad: 86.1 mm[Hg]
Radius: 0.43 cm
S' Lateral: 2.2 cm

## 2023-06-09 NOTE — Progress Notes (Signed)
*  PRELIMINARY RESULTS* Echocardiogram 2D Echocardiogram has been performed.  Shelby Oliver 06/09/2023, 10:34 AM

## 2023-06-14 ENCOUNTER — Encounter: Payer: Self-pay | Admitting: Internal Medicine

## 2023-06-14 ENCOUNTER — Ambulatory Visit: Payer: PPO | Admitting: Internal Medicine

## 2023-06-14 VITALS — BP 122/70 | HR 76 | Ht <= 58 in | Wt 132.0 lb

## 2023-06-14 DIAGNOSIS — Z6833 Body mass index (BMI) 33.0-33.9, adult: Secondary | ICD-10-CM | POA: Diagnosis not present

## 2023-06-14 DIAGNOSIS — K449 Diaphragmatic hernia without obstruction or gangrene: Secondary | ICD-10-CM

## 2023-06-14 DIAGNOSIS — R1310 Dysphagia, unspecified: Secondary | ICD-10-CM

## 2023-06-14 DIAGNOSIS — K222 Esophageal obstruction: Secondary | ICD-10-CM

## 2023-06-14 DIAGNOSIS — K2100 Gastro-esophageal reflux disease with esophagitis, without bleeding: Secondary | ICD-10-CM | POA: Diagnosis not present

## 2023-06-14 DIAGNOSIS — K219 Gastro-esophageal reflux disease without esophagitis: Secondary | ICD-10-CM

## 2023-06-14 DIAGNOSIS — E669 Obesity, unspecified: Secondary | ICD-10-CM | POA: Diagnosis not present

## 2023-06-14 MED ORDER — PANTOPRAZOLE SODIUM 40 MG PO TBEC: 40.0000 mg | DELAYED_RELEASE_TABLET | Freq: Every day | ORAL | 3 refills | Status: AC

## 2023-06-14 NOTE — Patient Instructions (Signed)
 We have sent the following medications to your pharmacy for you to pick up at your convenience:  Protonix   Please follow up in one year.  _______________________________________________________  If your blood pressure at your visit was 140/90 or greater, please contact your primary care physician to follow up on this.  _______________________________________________________  If you are age 78 or older, your body mass index should be between 23-30. Your Body mass index is 33.04 kg/m. If this is out of the aforementioned range listed, please consider follow up with your Primary Care Provider.  If you are age 9 or younger, your body mass index should be between 19-25. Your Body mass index is 33.04 kg/m. If this is out of the aformentioned range listed, please consider follow up with your Primary Care Provider.   ________________________________________________________  The [facility] providers would like to encourage you to use MYCHART to communicate with providers for non-urgent requests or questions.  Due to long hold times on the telephone, sending your provider a message by [facility] may be a faster and more efficient way to get a response.  Please allow 48 business hours for a response.  Please remember that this is for non-urgent requests.  _______________________________________________________

## 2023-06-14 NOTE — Progress Notes (Signed)
 HISTORY OF PRESENT ILLNESS:  Shelby Oliver is a 78 y.o. female who was evaluated in the office December 08, 2022 regarding GERD with intermittent active symptoms, recurrent intermittent solid food dysphagia, and chronic intermittent right upper quadrant pain consistent with musculoskeletal etiology.  See that dictation.  Patient was instructed to reinitiate PPI therapy and was prescribed pantoprazole  40 mg daily.  She was set up for upper endoscopy with esophageal dilation which was performed January 06, 2023.  She was found to have a distal esophageal stricture as well as active esophagitis.  The stricture was balloon dilated to 20 mm.  She was also noted to have a large hiatal hernia.  She was advised with regards to reflux precautions, told to continue PPI, and follow-up in 3 months.  Patient follows up at this time.  She states that she is feeling the best that she has felt in many years.  She is not experiencing reflux symptoms despite diet.  No further issues with significant regurgitation.  Solid food dysphagia has resolved.  She would very much like to discontinue PPI therapy or take a holiday.  She is concerned about side effects such as osteoporosis.  REVIEW OF SYSTEMS:  All non-GI ROS negative except for sinus and allergy, arthritis, back pain, cough, itching, muscle cramps, nosebleeds, skin rash, sleeping problems, ankle swelling, shortness of breath  Past Medical History:  Diagnosis Date   Chronic kidney disease    RENAL INSUFF   Complication of anesthesia    RESPIRATORY PROBLEMS WITH SHOULDER SURGERY   Dysrhythmia    Edema    FEET/ANKLES   Erosive esophagitis    Gallbladder polyp    GERD (gastroesophageal reflux disease)    Heart murmur    Hepatic hemangioma    Hepatitis    A IN PAST   History of hiatal hernia    Hyperlipidemia    Mild intermittent asthma    MVP (mitral valve prolapse)    with slight regurgitation   Orthopnea    Osteoporosis    Proteinuria     Pulmonary nodule 2000's   stable over years--no more testing   Sleep disturbance    chronic   Thyroid  nodule     Past Surgical History:  Procedure Laterality Date   APPENDECTOMY     CARPAL TUNNEL RELEASE Bilateral 1987   CATARACT EXTRACTION W/PHACO Left 08/17/2017   Procedure: CATARACT EXTRACTION PHACO AND INTRAOCULAR LENS PLACEMENT (IOC);  Surgeon: Myrna Adine Anes, MD;  Location: [facility] ORS;  Service: Ophthalmology;  Laterality: Left;  [place]  00:29.0 AP% 11.1 CDE 3.21 Fluid Pack lot # [id number]   CATARACT EXTRACTION W/PHACO Right 09/14/2017   Procedure: CATARACT EXTRACTION PHACO AND INTRAOCULAR LENS PLACEMENT (IOC);  Surgeon: Myrna Adine Anes, MD;  Location: [facility] ORS;  Service: Ophthalmology;  Laterality: Right;  US  00:21.0 AP% 4.7 CDE 1.00 Fluid Pack Lot # [id number]   GANGLION CYST EXCISION Right    SHOULDER SURGERY Bilateral 1988, 2001   TONSILLECTOMY     TRIGGER FINGER RELEASE Right     Social History Ladaisha Portillo  reports that she has never smoked. She has been exposed to tobacco smoke. She has never used smokeless tobacco. She reports that she does not drink alcohol and does not use drugs.  family history includes Arthritis in her father and mother; Congestive Heart Failure in her sister; Heart disease in her sister; Hyperlipidemia in her mother; Hypertension in her mother and sister; Kidney cancer in her mother; Stroke in her father.  Allergies  Allergen  Reactions   Azithromycin  Shortness Of Breath    SOB abd pain diarrhea   Avelox  [Moxifloxacin  Hcl In Nacl] Hives   Ciprocin-Fluocin-Procin [Fluocinolone]    Penicillins Hives    Has patient had a PCN reaction causing immediate rash, facial/tongue/throat swelling, SOB or lightheadedness with hypotension: no Has patient had a PCN reaction causing severe rash involving mucus membranes or skin necrosis: no Has patient had a PCN reaction that required hospitalization: no Has patient had a PCN reaction occurring within the  last 10 years: no If all of the above answers are NO, then may proceed with Cephalosporin use.    Quinolones Hives       PHYSICAL EXAMINATION: Vital signs: BP 122/70   Pulse 76   Ht 4' 5 (1.346 m)   Wt 132 lb (59.9 kg)   BMI 33.04 kg/m  General: Well-developed, well-nourished, no acute distress HEENT: Anicteric Abdomen: Not reexamined psychiatric: alert and oriented x3. Cooperative Extremities: No visible abnormalities  ASSESSMENT:  1.  GERD with esophagitis, peptic stricture, and large hiatal hernia.  Currently asymptomatic post dilation on PPI 2.  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  Improved post dilation. 3.  Obesity 4.  General Medical problems.  Stable   PLAN:  1.  Reflux precautions with attention to weight loss 2.  Encouraged to continue PPI therapy given the complications of esophagitis and stricture 3.  Prescription refilled for 1 year.  Medication risks reviewed in detail. 4.  Routine office follow-up 1 year.  Please contact the office in the interim for any questions or problems Total time of 30 minutes was spent preparing to see the patient, obtaining interval history, performing medically appropriate physical examination, counseling and educating the patient regarding the above listed issues, answering multiple questions, ordering medication, defining follow-up intervals, and documenting clinical information in the health record

## 2023-07-27 DIAGNOSIS — H0289 Other specified disorders of eyelid: Secondary | ICD-10-CM | POA: Diagnosis not present

## 2023-07-27 DIAGNOSIS — H1013 Acute atopic conjunctivitis, bilateral: Secondary | ICD-10-CM | POA: Diagnosis not present

## 2023-07-27 DIAGNOSIS — H26493 Other secondary cataract, bilateral: Secondary | ICD-10-CM | POA: Diagnosis not present

## 2023-07-27 DIAGNOSIS — H04123 Dry eye syndrome of bilateral lacrimal glands: Secondary | ICD-10-CM | POA: Diagnosis not present

## 2023-07-27 DIAGNOSIS — Z961 Presence of intraocular lens: Secondary | ICD-10-CM | POA: Diagnosis not present

## 2023-08-16 ENCOUNTER — Encounter: Payer: Self-pay | Admitting: Student

## 2023-08-16 ENCOUNTER — Ambulatory Visit: Payer: PPO | Admitting: Student

## 2023-08-16 VITALS — BP 126/82 | HR 83 | Temp 97.8°F | Ht <= 58 in | Wt 134.0 lb

## 2023-08-16 DIAGNOSIS — M810 Age-related osteoporosis without current pathological fracture: Secondary | ICD-10-CM | POA: Diagnosis not present

## 2023-08-16 DIAGNOSIS — K2210 Ulcer of esophagus without bleeding: Secondary | ICD-10-CM

## 2023-08-16 DIAGNOSIS — Z1159 Encounter for screening for other viral diseases: Secondary | ICD-10-CM

## 2023-08-16 DIAGNOSIS — R7303 Prediabetes: Secondary | ICD-10-CM

## 2023-08-16 DIAGNOSIS — E785 Hyperlipidemia, unspecified: Secondary | ICD-10-CM | POA: Diagnosis not present

## 2023-08-16 DIAGNOSIS — M461 Sacroiliitis, not elsewhere classified: Secondary | ICD-10-CM | POA: Diagnosis not present

## 2023-08-16 DIAGNOSIS — I341 Nonrheumatic mitral (valve) prolapse: Secondary | ICD-10-CM

## 2023-08-16 DIAGNOSIS — R809 Proteinuria, unspecified: Secondary | ICD-10-CM | POA: Diagnosis not present

## 2023-08-16 DIAGNOSIS — G6289 Other specified polyneuropathies: Secondary | ICD-10-CM

## 2023-08-16 NOTE — Patient Instructions (Addendum)
 Consider looking into this protein supplement option:   Nice seeing you today. I am glad you are doing well. I wish we could get rid of the left hip and and sacroiliac joint issues.   We can check labs before your next appointment.   Labs before your next appointment - they will come to your apartment to collect the labs.

## 2023-08-16 NOTE — Progress Notes (Signed)
 Location:  [facility]: [facility] Provider: Sydnee Cabal  Code Status: Full Code Goals of Care:     08/16/2023    1:54 PM  Advanced Directives  Does Patient Have a Medical Advance Directive? Yes  Type of Estate agent of [name];Living will;Out of facility DNR (pink MOST or yellow form)  Does patient want to make changes to medical advance directive? No - Patient declined  Copy of Healthcare Power of Attorney in Chart? Yes - validated most recent copy scanned in chart (See row information)     Chief Complaint  Patient presents with   Medical Management of Chronic Issues    Medical Management of Chronic Issues. 3 Month follow up. To discuss the need for Covid and Hep C Screen    HPI: Patient is a 78 y.o. female seen today for medical management of chronic diseases.   Discussed the use of AI scribe software for clinical note transcription with the patient, who gave verbal consent to proceed.  History of Present Illness Shelby Oliver is a 78 year old female with osteoporosis and chronic pain who presents for follow-up on her bone density and pain management.  She experiences worsening numbness and burning sensation in her left leg, particularly when standing. Activities like laundry and ironing exacerbate these symptoms. The frequency of these symptoms has decreased since starting gabapentin, which she takes regularly. No leg weakness, bowel or bladder incontinence, or back pain reported.  She has sacroiliac pain, especially when bending or picking up objects, and discomfort in the upper iliac crest. This pain affects her ability to lie down comfortably, leading her to sleep in a recliner for the past five years. She finds it difficult to lie flat, which also impacts her ability to undergo certain medical tests like bone density scans. She occasionally uses Tylenol for pain.  She has a history of osteoporosis, diagnosed before menopause, and has undergone  various treatments including estradiol and bisphosphonates like Fosamax and Actonel. Her bone density improved during treatment, but she has not been on bisphosphonates since 2014. She continues to take vitamin D3 and consumes a diet rich in calcium, preferring dietary sources over supplements.  She has a history of esophageal issues, including active esophagitis, and has undergone dilation procedures. She adheres to a bland diet to manage her symptoms and continues on prescribed medications for esophagitis.  She reports a stable cardiac condition with mild aortic valve regurgitation and mild to moderate mitral valve regurgitation, unchanged over the years. Her sister required mitral valve surgery, indicating a family history of cardiac issues.  Her sleep is disrupted, often waking up at night and finding it difficult to return to sleep. She sleeps in a recliner due to discomfort when lying flat. She has not been tested for sleep apnea but denies symptoms like snoring or gasping for air. She takes gabapentin at night.  She has gained weight, which she attributes to gabapentin, and notes that her clothes sometimes feel tight. Her diet is consistent, primarily consisting of cheese, beans, and lentils. She avoids supplements other than vitamin D3 and is cautious about protein supplements due to additives.     Past Medical History:  Diagnosis Date   Chronic kidney disease    RENAL INSUFF   Complication of anesthesia    RESPIRATORY PROBLEMS WITH SHOULDER SURGERY   Dysrhythmia    Edema    FEET/ANKLES   Erosive esophagitis    Gallbladder polyp    GERD (gastroesophageal reflux  disease)    Heart murmur    Hepatic hemangioma    Hepatitis    A IN PAST   History of hiatal hernia    Hyperlipidemia    Mild intermittent asthma    MVP (mitral valve prolapse)    with slight regurgitation   Orthopnea    Osteoporosis    Proteinuria    Pulmonary nodule 2000's   stable over years--no more testing    Sleep disturbance    chronic   Thyroid nodule     Past Surgical History:  Procedure Laterality Date   APPENDECTOMY     CARPAL TUNNEL RELEASE Bilateral 1987   CATARACT EXTRACTION W/PHACO Left 08/17/2017   Procedure: CATARACT EXTRACTION PHACO AND INTRAOCULAR LENS PLACEMENT (IOC);  Surgeon: Nevada Crane, MD;  Location: [facility] ORS;  Service: Ophthalmology;  Laterality: Left;  [place] 00:29.0 AP% 11.1 CDE 3.21 Fluid Pack lot # [id number]   CATARACT EXTRACTION W/PHACO Right 09/14/2017   Procedure: CATARACT EXTRACTION PHACO AND INTRAOCULAR LENS PLACEMENT (IOC);  Surgeon: Nevada Crane, MD;  Location: [facility] OR[facility];  Service: Ophthalmology;  Laterality: Right;  [place] 00:21.0 AP% 4.7 CDE 1.00 Fluid Pack Lot # [id number]   GANGLION CYST EXCISION Right    SHOULDER SURGERY Bilateral 1988, 2001   TONSILLECTOMY     TRIGGER FINGER RELEASE Right     Allergies  Allergen Reactions   Azithromycin Shortness Of Breath    SOB abd pain diarrhea   Avelox [Moxifloxacin Hcl In Nacl] Hives   Ciprocin-Fluocin-Procin [Fluocinolone]    Penicillins Hives    Has patient had a PCN reaction causing immediate rash, facial/tongue/throat swelling, SOB or lightheadedness with hypotension: no Has patient had a PCN reaction causing severe rash involving mucus membranes or skin necrosis: no Has patient had a PCN reaction that required hospitalization: no Has patient had a PCN reaction occurring within the last 10 years: no If all of the above answers are "NO", then may proceed with Cephalosporin use.    Quinolones Hives    Outpatient Encounter Medications as of 08/16/2023  Medication Sig   acetaminophen (TYLENOL) 500 MG tablet Take 1,000 mg by mouth every 8 (eight) hours as needed.   atorvastatin (LIPITOR) 20 MG tablet Take 1 tablet (20 mg total) by mouth daily.   cetirizine (ZYRTEC) 10 MG tablet Take 10 mg by mouth daily.   cholecalciferol (VITAMIN D) 1000 units tablet Take 3,000 Units by mouth daily.   gabapentin  (NEURONTIN) 300 MG capsule Take 1 capsule (300 mg total) by mouth at bedtime.   metoprolol succinate (TOPROL-XL) 25 MG 24 hr tablet TAKE 1 TABLET(25 MG) BY MOUTH DAILY   montelukast (SINGULAIR) 10 MG tablet TAKE 1 TABLET(10 MG) BY MOUTH DAILY   Olopatadine HCl 0.7 % SOLN Place 1 drop into both eyes daily.   pantoprazole (PROTONIX) 40 MG tablet Take 1 tablet (40 mg total) by mouth daily.   Polyethyl Glycol-Propyl Glycol (SYSTANE OP) Apply 1 drop to eye as needed.   No facility-administered encounter medications on file as of 08/16/2023.    Review of Systems:  Review of Systems  Health Maintenance  Topic Date Due   Hepatitis C Screening  Never done   COVID-19 Vaccine (10 - 2024-25 season) 03/31/2023   INFLUENZA VACCINE  12/08/2023   Medicare Annual Wellness (AWV)  04/26/2024   DTaP/Tdap/Td (3 - Tdap) 02/17/2025   Pneumonia Vaccine 61+ Years old  Completed   DEXA SCAN  Completed   Zoster Vaccines- Shingrix  Completed   HPV  VACCINES  Aged Out   Colonoscopy  Discontinued    Physical Exam: Vitals:   08/16/23 1350  BP: 126/82  Pulse: 83  Temp: 97.8 F (36.6 C)  SpO2: 99%  Weight: 134 lb (60.8 kg)  Height: 4\' 5"  (1.346 m)   Body mass index is 33.54 kg/m. Physical Exam Constitutional:      Appearance: Normal appearance.  Cardiovascular:     Rate and Rhythm: Normal rate and regular rhythm.     Pulses: Normal pulses.     Heart sounds: Normal heart sounds.  Pulmonary:     Effort: Pulmonary effort is normal.  Abdominal:     General: Abdomen is flat. Bowel sounds are normal.     Palpations: Abdomen is soft.  Musculoskeletal:        General: No swelling or tenderness.  Skin:    General: Skin is warm and dry.  Neurological:     Mental Status: She is alert and oriented to person, place, and time.     Gait: Gait normal.  Psychiatric:        Mood and Affect: Mood normal.    Labs reviewed: Basic Metabolic Panel: Recent Labs    09/12/22 0000 05/01/23 0736 05/08/23 0900   NA 139 138  --   K 4.2 4.0  --   CL 102 102  --   CO2 28* 30  --   GLUCOSE  --  111* CANCELED  BUN 26* 26*  --   CREATININE 0.7 0.70  --   CALCIUM 9.6 9.4  --   TSH 3.42  --   --    Liver Function Tests: Recent Labs    09/12/22 0000 05/01/23 0736  AST 13 15  ALT 11 18  ALKPHOS 64  --   BILITOT  --  0.6  PROT  --  7.3  ALBUMIN 4.4  --    No results for input(s): "LIPASE", "AMYLASE" in the last 8760 hours. No results for input(s): "AMMONIA" in the last 8760 hours. CBC: Recent Labs    09/12/22 0000  WBC 6.4  NEUTROABS 4,250.00  HGB 12.7  HCT 38  PLT 256   Lipid Panel: Recent Labs    09/12/22 0000 05/01/23 0736 05/08/23 0900  CHOL 208* 165  --   HDL 43 40*  --   LDLCALC 116 87 CANCELED  TRIG 340* 318*  --   CHOLHDL  --  4.1  --    Lab Results  Component Value Date   HGBA1C CANCELED 05/08/2023    Procedures since last visit: No results found. Results LABS Vitamin D25: Low  RADIOLOGY DEXA scan: Consistent with osteoporosis  DIAGNOSTIC Echocardiogram: Mild aortic valve regurgitation, mild to moderate mitral valve regurgitation Endoscopy: Active esophagitis  Assessment/Plan Assessment & Plan Peripheral Neuropathy Chronic numbness and burning sensation in the left leg, exacerbated by standing and activities like laundry and ironing. Symptoms have improved with gabapentin, though not completely resolved. She finds the current level of symptoms manageable. Gabapentin may contribute to slight weight gain, which is a concern given her small stature. - Continue gabapentin for neuropathic pain management. - Monitor weight and dietary habits to manage potential weight gain.  Sacroiliac Joint Pain Chronic pain in the upper iliac crest, exacerbated by certain movements and positions, affecting her ability to lie flat and resulting in sleeping in a recliner for five years. Pain is cyclical and impacts daily activities. She has not been able to participate in  exercise classes but continues with some  exercises at home as instructed by her therapist. - Continue current pain management strategies. - Encourage gentle stretching and exercises as tolerated.  Osteoporosis Long-standing osteoporosis confirmed by recent DEXA scan. Current management includes dietary calcium and vitamin D supplementation. No recent fractures reported. Discussed that without fractures, there is no clear indication to restart bisphosphonate therapy at this time given her previous >10 year completion of therapy. She prefers dietary calcium intake over supplements. - Continue vitamin D supplementation. - Ensure adequate dietary calcium intake.  Aortic and Mitral Valve Regurgitation Mild aortic valve regurgitation and mild to moderate mitral valve regurgitation. Echocardiogram shows no significant changes over the years. No immediate surgical intervention required. Regular monitoring is essential to track any progression. - Continue regular monitoring with echocardiograms as needed.  Esophagitis Chronic esophagitis with history of esophageal dilation. Reports improvement in symptoms but continues to require medication. Discussed the risks and benefits of continuing PPI therapy, emphasizing the need to manage active esophagitis to prevent complications. She adheres to a bland diet to manage symptoms. - Continue current PPI therapy as prescribed.   I spent greater than 40 minutes for the care of this patient in face to face time, chart review, clinical documentation, patient education.    Labs/tests ordered: - Microalbumin / creatinine urine ratio - Lipid panel - TSH - Hemoglobin A1c - CBC With Differential/Platelet - COMPLETE METABOLIC PANEL WITHOUT GFR Next appt:  11/17/2023

## 2023-08-29 ENCOUNTER — Other Ambulatory Visit: Payer: Self-pay | Admitting: Student

## 2023-08-29 DIAGNOSIS — E785 Hyperlipidemia, unspecified: Secondary | ICD-10-CM

## 2023-09-03 ENCOUNTER — Other Ambulatory Visit: Payer: Self-pay | Admitting: Student

## 2023-09-03 DIAGNOSIS — I491 Atrial premature depolarization: Secondary | ICD-10-CM

## 2023-09-04 ENCOUNTER — Other Ambulatory Visit: Payer: Self-pay | Admitting: Student

## 2023-09-04 DIAGNOSIS — J4520 Mild intermittent asthma, uncomplicated: Secondary | ICD-10-CM

## 2023-10-28 ENCOUNTER — Other Ambulatory Visit: Payer: Self-pay | Admitting: Student

## 2023-10-28 DIAGNOSIS — M461 Sacroiliitis, not elsewhere classified: Secondary | ICD-10-CM

## 2023-11-13 DIAGNOSIS — E785 Hyperlipidemia, unspecified: Secondary | ICD-10-CM | POA: Diagnosis not present

## 2023-11-13 DIAGNOSIS — R7303 Prediabetes: Secondary | ICD-10-CM | POA: Diagnosis not present

## 2023-11-13 DIAGNOSIS — R809 Proteinuria, unspecified: Secondary | ICD-10-CM | POA: Diagnosis not present

## 2023-11-13 LAB — LIPID PANEL
Cholesterol: 165 mg/dL (ref <200)
HDL: 50 mg/dL (ref >=50)
LDL Cholesterol (Calc): 86 mg/dL
Non-HDL Cholesterol (Calc): 115 mg/dL (ref <130)
Total CHOL/HDL Ratio: 3.3 (calc) (ref <5.0)
Triglycerides: 194 mg/dL — ABNORMAL HIGH (ref <150)

## 2023-11-13 LAB — HEMOGLOBIN A1C
Hgb A1c MFr Bld: 6 % — ABNORMAL HIGH (ref <5.7)
Mean Plasma Glucose: 126 mg/dL
eAG (mmol/L): 7 mmol/L

## 2023-11-13 LAB — COMPLETE METABOLIC PANEL WITHOUT GFR
AG Ratio: 1.5 (calc) (ref 1.0–2.5)
ALT: 15 U/L (ref 6–29)
AST: 15 U/L (ref 10–35)
Albumin: 4.4 g/dL (ref 3.6–5.1)
Alkaline phosphatase (APISO): 57 U/L (ref 37–153)
BUN: 22 mg/dL (ref 7–25)
CO2: 29 mmol/L (ref 20–32)
Calcium: 9.4 mg/dL (ref 8.6–10.4)
Chloride: 102 mmol/L (ref 98–110)
Creat: 0.71 mg/dL (ref 0.60–1.00)
Globulin: 3 g/dL (ref 1.9–3.7)
Glucose, Bld: 100 mg/dL — ABNORMAL HIGH (ref 65–99)
Potassium: 4.1 mmol/L (ref 3.5–5.3)
Sodium: 139 mmol/L (ref 135–146)
Total Bilirubin: 0.6 mg/dL (ref 0.2–1.2)
Total Protein: 7.4 g/dL (ref 6.1–8.1)

## 2023-11-13 LAB — CBC WITH DIFFERENTIAL/PLATELET
Absolute Lymphocytes: 1524 {cells}/uL (ref 850–3900)
Absolute Monocytes: 501 {cells}/uL (ref 200–950)
Basophils Absolute: 39 {cells}/uL (ref 0–200)
Basophils Relative: 0.7 %
Eosinophils Absolute: 99 {cells}/uL (ref 15–500)
Eosinophils Relative: 1.8 %
HCT: 40.4 % (ref 35.0–45.0)
Hemoglobin: 12.8 g/dL (ref 11.7–15.5)
MCH: 28.6 pg (ref 27.0–33.0)
MCHC: 31.7 g/dL — ABNORMAL LOW (ref 32.0–36.0)
MCV: 90.2 fL (ref 80.0–100.0)
MPV: 11 fL (ref 7.5–12.5)
Monocytes Relative: 9.1 %
Neutro Abs: 3339 {cells}/uL (ref 1500–7800)
Neutrophils Relative %: 60.7 %
Platelets: 219 Thousand/uL (ref 140–400)
RBC: 4.48 Million/uL (ref 3.80–5.10)
RDW: 12.8 % (ref 11.0–15.0)
Total Lymphocyte: 27.7 %
WBC: 5.5 Thousand/uL (ref 3.8–10.8)

## 2023-11-13 LAB — MICROALBUMIN / CREATININE URINE RATIO
Creatinine, Urine: 59 mg/dL (ref 20–275)
Microalb, Ur: <0.2 mg/dL

## 2023-11-13 LAB — TSH: TSH: 2.45 m[IU]/L (ref 0.40–4.50)

## 2023-11-17 ENCOUNTER — Ambulatory Visit: Payer: Self-pay | Admitting: Student

## 2023-11-17 ENCOUNTER — Encounter: Payer: Self-pay | Admitting: Student

## 2023-11-17 ENCOUNTER — Ambulatory Visit: Admitting: Student

## 2023-11-17 VITALS — BP 126/78 | HR 71 | Temp 98.0°F | Ht <= 58 in | Wt 133.0 lb

## 2023-11-17 DIAGNOSIS — R7303 Prediabetes: Secondary | ICD-10-CM | POA: Diagnosis not present

## 2023-11-17 DIAGNOSIS — M810 Age-related osteoporosis without current pathological fracture: Secondary | ICD-10-CM | POA: Diagnosis not present

## 2023-11-17 DIAGNOSIS — M461 Sacroiliitis, not elsewhere classified: Secondary | ICD-10-CM

## 2023-11-17 DIAGNOSIS — G6289 Other specified polyneuropathies: Secondary | ICD-10-CM | POA: Diagnosis not present

## 2023-11-17 DIAGNOSIS — E785 Hyperlipidemia, unspecified: Secondary | ICD-10-CM

## 2023-11-17 DIAGNOSIS — E669 Obesity, unspecified: Secondary | ICD-10-CM | POA: Diagnosis not present

## 2023-11-17 NOTE — Patient Instructions (Signed)
 VISIT SUMMARY:  During your visit, we discussed your ongoing issues with insomnia, chronic pain in your thoracic spine and pelvis, prediabetes, hypertriglyceridemia, and fatty liver. We reviewed your current symptoms, family history, and dietary habits, and developed a plan to manage each of these conditions.  YOUR PLAN:  -INSOMNIA: Insomnia is a condition where you have trouble falling or staying asleep. We discussed the possibility of a sleep study if your symptoms persist or worsen. For now, continue with your current routine and monitor your sleep patterns.  -THORACIC SPINE PAIN: This is chronic pain in the middle part of your back, which gets worse with certain activities. Continue using Tylenol as needed for pain relief and try to avoid activities that make the pain worse.  -PELVIC PAIN: This is intermittent pain in your pelvic area that is triggered by activities like sitting, walking, or bending. Continue using Tylenol as needed for pain relief and avoid activities that exacerbate the pain.  -PREDIABETES: Prediabetes means your blood sugar levels are higher than normal but not high enough to be classified as diabetes. Your A1c level is at 6.0. We discussed the importance of weight management and dietary changes, such as reducing fruit intake and increasing vegetable consumption. We will continue to monitor your A1c levels.  -HYPERTRIGLYCERIDEMIA: This condition means you have high levels of triglycerides in your blood. Your levels have improved from 318 to 194 with medication. We discussed making dietary changes to further improve your triglyceride levels. Continue taking your current cholesterol medication.  -FATTY LIVER: Fatty liver is a condition where fat builds up in your liver, often related to weight gain. Your liver function tests are currently well-managed. We will continue to monitor your liver function and encourage weight management through diet and  exercise.  INSTRUCTIONS:  Consider a sleep study if your insomnia symptoms persist or worsen. Continue monitoring your A1c levels and liver function tests. Make dietary changes to reduce fruit intake and increase vegetable consumption to help manage prediabetes and hypertriglyceridemia. Continue taking your current cholesterol medication.

## 2023-11-19 ENCOUNTER — Encounter: Payer: Self-pay | Admitting: Student

## 2023-11-19 NOTE — Progress Notes (Signed)
 [facility]: [facility] Provider: ABDUL  Code Status: DNR Goals of Care:     11/17/2023   10:52 AM  Advanced Directives  Does Patient Have a Medical Advance Directive? Yes  Type of Estate agent of [place];Living will;Out of facility DNR (pink MOST or yellow form)  Does patient want to make changes to medical advance directive? No - Patient declined  Copy of Healthcare Power of Attorney in Chart? Yes - validated most recent copy scanned in chart (See row information)     Chief Complaint  Patient presents with   Medical Management of Chronic Issues    Medical Management of Chronic Issues. 3 Month follow up with labs.     HPI: Patient is a 78 y.o. female seen today for medical management of chronic diseases.   Discussed the use of AI scribe software for clinical note transcription with the patient, who gave verbal consent to proceed.  History of Present Illness   Shelby Oliver is a 78 year old female with insomnia and chronic pain who presents with persistent sleep disturbances and musculoskeletal pain.  She experiences ongoing difficulties with sleep, describing some nights as completely sleepless, leading to daytime fatigue and a feeling of being in a stupor. She reports that she does not snore and has no known history of sleep apnea. Her diet primarily consists of fruit, lentil soup, navy beans, cottage cheese, yogurt, toast with peanut butter, and whey protein. She occasionally forgets to take her whey protein and is concerned about weight changes after menopause, noting difficulty fitting into her clothes.  She experiences chronic pain in her thoracic spine and pelvis, exacerbated by activities such as getting in and out of cars, sitting, walking, or bending. She uses Tylenol for pain relief, taking it intermittently based on her activity level. She mentions a decrease in numbness on her left side recently. She has a history of tennis  elbow and is prone to injuries, which limits her physical activity. She avoids weight lifting due to discomfort and past injuries.  Family history is significant for her father having an aneurysm and possibly sleep apnea, her mother having metastatic renal cell carcinoma, her sister having sleep apnea, and her grandniece having POTS, tachycardia, and a connective tissue disorder.  She is concerned about her triglyceride levels, which have improved with medication, and is aware of her prediabetic status. She has a history of fatty liver, which she attributes to weight gain post-menopause.         Past Medical History:  Diagnosis Date   Chronic kidney disease    RENAL INSUFF   Complication of anesthesia    RESPIRATORY PROBLEMS WITH SHOULDER SURGERY   Dysrhythmia    Edema    FEET/ANKLES   Erosive esophagitis    Gallbladder polyp    GERD (gastroesophageal reflux disease)    Heart murmur    Hepatic hemangioma    Hepatitis    A IN PAST   History of hiatal hernia    Hyperlipidemia    Mild intermittent asthma    MVP (mitral valve prolapse)    with slight regurgitation   Orthopnea    Osteoporosis    Proteinuria    Pulmonary nodule 2000's   stable over years--no more testing   Sleep disturbance    chronic   Thyroid  nodule     Past Surgical History:  Procedure Laterality Date   APPENDECTOMY     CARPAL TUNNEL RELEASE Bilateral 1987  CATARACT EXTRACTION W/PHACO Left 08/17/2017   Procedure: CATARACT EXTRACTION PHACO AND INTRAOCULAR LENS PLACEMENT (IOC);  Surgeon: Myrna Adine Anes, MD;  Location: [facility];  Service: Ophthalmology;  Laterality: Left;  US  00:29.0 AP% 11.1 CDE 3.21 Fluid Pack lot # [id number]   CATARACT EXTRACTION W/PHACO Right 09/14/2017   Procedure: CATARACT EXTRACTION PHACO AND INTRAOCULAR LENS PLACEMENT (IOC);  Surgeon: Myrna Adine Anes, MD;  Location: [facility];  Service: Ophthalmology;  Laterality: Right;  US  00:21.0 AP% 4.7 CDE 1.00 Fluid Pack Lot # [id number]    GANGLION CYST EXCISION Right    SHOULDER SURGERY Bilateral 1988, 2001   TONSILLECTOMY     TRIGGER FINGER RELEASE Right     Allergies  Allergen Reactions   Azithromycin  Shortness Of Breath    SOB abd pain diarrhea   Avelox  [Moxifloxacin  Hcl In Nacl] Hives   Ciprocin-Fluocin-Procin [Fluocinolone]    Penicillins Hives    Has patient had a PCN reaction causing immediate rash, facial/tongue/throat swelling, SOB or lightheadedness with hypotension: no Has patient had a PCN reaction causing severe rash involving mucus membranes or skin necrosis: no Has patient had a PCN reaction that required hospitalization: no Has patient had a PCN reaction occurring within the last 10 years: no If all of the above answers are NO, then may proceed with Cephalosporin use.    Quinolones Hives    Outpatient Encounter Medications as of 11/17/2023  Medication Sig   acetaminophen (TYLENOL) 500 MG tablet Take 1,000 mg by mouth every 8 (eight) hours as needed.   atorvastatin  (LIPITOR) 20 MG tablet TAKE 1 TABLET(20 MG) BY MOUTH DAILY   cetirizine (ZYRTEC) 10 MG tablet Take 10 mg by mouth daily.   cholecalciferol (VITAMIN D ) 1000 units tablet Take 3,000 Units by mouth daily.   gabapentin  (NEURONTIN ) 300 MG capsule TAKE 1 CAPSULE(300 MG) BY MOUTH AT BEDTIME   metoprolol  succinate (TOPROL -XL) 25 MG 24 hr tablet TAKE 1 TABLET(25 MG) BY MOUTH DAILY   montelukast  (SINGULAIR ) 10 MG tablet TAKE 1 TABLET(10 MG) BY MOUTH DAILY   Olopatadine HCl 0.7 % SOLN Place 1 drop into both eyes daily.   pantoprazole  (PROTONIX ) 40 MG tablet Take 1 tablet (40 mg total) by mouth daily.   Polyethyl Glycol-Propyl Glycol (SYSTANE OP) Apply 1 drop to eye as needed.   No facility-administered encounter medications on file as of 11/17/2023.    Review of Systems:  Review of Systems  Health Maintenance  Topic Date Due   Hepatitis C Screening  Never done   INFLUENZA VACCINE  12/08/2023   COVID-19 Vaccine (11 - 2024-25 season)  02/17/2024   Medicare Annual Wellness (AWV)  04/26/2024   DTaP/Tdap/Td (3 - Tdap) 02/17/2025   Pneumococcal Vaccine: 50+ Years  Completed   DEXA SCAN  Completed   Zoster Vaccines- Shingrix  Completed   Hepatitis B Vaccines  Aged Out   HPV VACCINES  Aged Out   Meningococcal B Vaccine  Aged Out   Colonoscopy  Discontinued    Physical Exam: Vitals:   11/17/23 1049  BP: 126/78  Pulse: 71  Temp: 98 F (36.7 C)  SpO2: 98%  Weight: 133 lb (60.3 kg)  Height: 4' 5 (1.346 m)   Body mass index is 33.29 kg/m. Physical Exam Constitutional:      Appearance: Normal appearance.  Cardiovascular:     Rate and Rhythm: Normal rate and regular rhythm.     Pulses: Normal pulses.     Heart sounds: Normal heart sounds.  Pulmonary:  Effort: Pulmonary effort is normal.  Abdominal:     General: Abdomen is flat. Bowel sounds are normal.     Palpations: Abdomen is soft.  Musculoskeletal:        General: No swelling or tenderness.  Skin:    General: Skin is warm and dry.  Neurological:     Mental Status: She is alert and oriented to person, place, and time.     Gait: Gait normal.  Psychiatric:        Mood and Affect: Mood normal.    Physical Exam          Labs reviewed: Basic Metabolic Panel: Recent Labs    05/01/23 0736 05/08/23 0900 11/13/23 0924  NA 138  --  139  K 4.0  --  4.1  CL 102  --  102  CO2 30  --  29  GLUCOSE 111* CANCELED 100*  BUN 26*  --  22  CREATININE 0.70  --  0.71  CALCIUM  9.4  --  9.4  TSH  --   --  2.45   Liver Function Tests: Recent Labs    05/01/23 0736 11/13/23 0924  AST 15 15  ALT 18 15  BILITOT 0.6 0.6  PROT 7.3 7.4   No results for input(s): LIPASE, AMYLASE in the last 8760 hours. No results for input(s): AMMONIA in the last 8760 hours. CBC: Recent Labs    11/13/23 0924  WBC 5.5  NEUTROABS 3,339  HGB 12.8  HCT 40.4  MCV 90.2  PLT 219   Lipid Panel: Recent Labs    05/01/23 0736 05/08/23 0900 11/13/23 0924  CHOL  165  --  165  HDL 40*  --  50  LDLCALC 87 CANCELED 86  TRIG 318*  --  194*  CHOLHDL 4.1  --  3.3   Lab Results  Component Value Date   HGBA1C 6.0 (H) 11/13/2023    Procedures since last visit: No results found. Results   LABS Blood Glucose: 100 (11/17/2023) Creatinine: 0.7 (11/17/2023) Sodium: 139 (11/17/2023) Potassium: 4.1 (11/17/2023) Albumin: 4.4 (11/17/2023) Bilirubin: 0.6 (11/17/2023) AST: 15 (11/17/2023) ALT: 15 (11/17/2023) Microalbumin: normal (11/17/2023) Triglycerides: 194 (11/17/2023) TSH: 2.45 (11/17/2023) HbA1c: 6.0 (11/17/2023) Hemoglobin: 12.8 (11/17/2023) MCHC: 31.7 (11/17/2023)  RADIOLOGY Liver Ultrasound: Hepatic steatosis      Assessment and Plan    Insomnia Chronic difficulty sleeping at night with some nights of complete sleeplessness. Fatigue during the day. No snoring or observed apneic episodes by family members. Potential for sleep apnea discussed, but she does not exhibit typical symptoms. - Consider sleep study if symptoms persist or worsen.  Thoracic spine pain Chronic thoracic spine pain exacerbated by activities such as getting in and out of cars. Increased Tylenol usage due to activity-related pain. - Continue Tylenol as needed for pain management. - Avoid activities that exacerbate pain.  Pelvic pain Intermittent pelvic pain related to activities such as sitting, walking, or bending. Increased Tylenol usage due to activity-related pain. - Continue Tylenol as needed for pain management. - Avoid activities that exacerbate pain.  Prediabetes A1c at 6.0, within the prediabetic range. Discussed weight management and dietary habits, including high fruit intake and limited vegetable consumption. She is hesitant about weight management medications due to concerns about side effects. - Continue monitoring A1c levels. - Encourage dietary changes to reduce fruit intake and increase vegetable consumption. - Consider weight management  medications if lifestyle changes are insufficient.  Hypertriglyceridemia Triglycerides decreased from 318 to 194, likely due to cholesterol medication. Dietary  habits include high fruit intake and limited vegetable consumption. Discussed potential dietary changes to further improve triglyceride levels. - Continue current cholesterol medication. - Encourage dietary changes to further reduce triglyceride levels.  Fatty liver Fatty liver likely related to weight gain post-menopause. Liver function tests indicate well-managed liver function despite fatty liver diagnosis. - Continue monitoring liver function tests. - Encourage weight management through diet and exercise.      Discussed medications for weight loss however patient declines at this time.   Labs/tests ordered:  * No order type specified * Next appt:  02/23/2024

## 2024-02-01 DIAGNOSIS — H1013 Acute atopic conjunctivitis, bilateral: Secondary | ICD-10-CM | POA: Diagnosis not present

## 2024-02-01 DIAGNOSIS — Z961 Presence of intraocular lens: Secondary | ICD-10-CM | POA: Diagnosis not present

## 2024-02-01 DIAGNOSIS — H04123 Dry eye syndrome of bilateral lacrimal glands: Secondary | ICD-10-CM | POA: Diagnosis not present

## 2024-02-01 DIAGNOSIS — H0289 Other specified disorders of eyelid: Secondary | ICD-10-CM | POA: Diagnosis not present

## 2024-02-21 ENCOUNTER — Encounter: Payer: Self-pay | Admitting: Internal Medicine

## 2024-02-21 ENCOUNTER — Non-Acute Institutional Stay (SKILLED_NURSING_FACILITY): Admitting: Internal Medicine

## 2024-02-21 VITALS — BP 134/78 | HR 75 | Temp 98.0°F | Ht <= 58 in | Wt 133.0 lb

## 2024-02-21 DIAGNOSIS — G8929 Other chronic pain: Secondary | ICD-10-CM

## 2024-02-21 DIAGNOSIS — E782 Mixed hyperlipidemia: Secondary | ICD-10-CM

## 2024-02-21 DIAGNOSIS — E669 Obesity, unspecified: Secondary | ICD-10-CM | POA: Diagnosis not present

## 2024-02-21 DIAGNOSIS — G479 Sleep disorder, unspecified: Secondary | ICD-10-CM

## 2024-02-21 DIAGNOSIS — M546 Pain in thoracic spine: Secondary | ICD-10-CM

## 2024-02-21 DIAGNOSIS — I700 Atherosclerosis of aorta: Secondary | ICD-10-CM | POA: Diagnosis not present

## 2024-02-21 DIAGNOSIS — R7303 Prediabetes: Secondary | ICD-10-CM | POA: Diagnosis not present

## 2024-02-21 NOTE — Progress Notes (Signed)
 NURSING HOME LOCATION:  [facility]  CODE STATUS:DNR  PCP: Caro Harlene POUR, NP   This is a nursing facility follow up visit of chronic medical diagnoses to document compliance with Regulation 483.30 (c) in The Long Term Care Survey Manual Phase 2 which mandates caregiver visit ( visits can alternate among physician, PA or NP as per statutes) within 10 days of 30 days / 60 days/ 90 days post admission to SNF date  .  Interim medical record and care since last SNF visit was updated with review of diagnostic studies and change in clinical status since last visit were documented.  HPI: She has multiple concerns related to her medical diagnoses.  She is most concerned about her weight and weight distribution as she has aged.  She believes she has gained weight although her weight has been stable as per our records.  She states that she has had GERD since 1982 and has been on a bland low-carb, essentially plant-based diet.  She states I like chocolate. She sleeps poorly and she thinks this exacerbates her carb craving.  She has been seen by a sleep specialist remotely who diagnosed her as having sleep maintenance dysfunction.  She denies apnea or snoring.  She did not find melatonin of any benefit.  It is interesting that her sister does have OSA.  The sleep issues are complicated as she cannot lie flat because of severe back discomfort.  She has been sleeping in recliner. She is concerned as her A1c had been 6.4% on 4/24.  On 7/7 A1c was 6.  Serially glucoses range from 100-111. She believes her father and paternal grandmother had cerebral aneurysms.  She is on a statin and LDL is 86. She is on a beta-blocker for palpitations which were documented to be PACs in the context of mitral valve prolapse.  The symptoms are essentially controlled at this time. She describes burning in the left lower extremity as well as numbness.  Lumbar radiculopathy has been questioned but  she has been hesitant to have an MRI of the spine.  Review of systems: Constitutional: No fever  Eyes: No redness, discharge, pain, vision change ENT/mouth: No nasal congestion,  purulent discharge, earache, change in hearing, sore throat  Cardiovascular: No chest pain, paroxysmal nocturnal dyspnea, claudication, edema  Respiratory: No cough, sputum production, hemoptysis, DOE   Gastrointestinal: No dysphagia, abdominal pain, nausea /vomiting, rectal bleeding, melena, change in bowels Genitourinary: No dysuria, hematuria, pyuria, incontinence, nocturia Dermatologic: No rash, pruritus, change in appearance of skin Neurologic: No dizziness, headache, syncope, seizures Psychiatric: No significant depression, anorexia Endocrine: No change in hair/skin/nails, excessive thirst, excessive hunger, excessive urination  Hematologic/lymphatic: No significant bruising, lymphadenopathy, abnormal bleeding Allergy/immunology: No itchy/watery eyes, significant sneezing, urticaria, angioedema  Physical exam:  Pertinent or positive findings: A grade  1/2-3 4 systolic murmur is present with slight increase in the second heart sound.  Pedal pulses are decreased.  She has trace edema at the sock line.  General appearance: Adequately nourished; no acute distress, increased work of breathing is present.   Lymphatic: No lymphadenopathy about the head, neck, axilla. Eyes: No conjunctival inflammation or lid edema is present. There is no scleral icterus. Ears:  External ear exam shows no significant lesions or deformities.   Nose:  External nasal examination shows no deformity or inflammation. Nasal mucosa are pink and moist without lesions, exudates Oral exam:  Lips and gums are healthy appearing. There is no oropharyngeal erythema or exudate. Neck:  No thyromegaly, masses, tenderness noted.    Heart:  Normal rate and regular rhythm. S1 and S2 normal without gallop, click, rub .  Lungs: Chest clear to auscultation  without wheezes, rhonchi, rales, rubs. Abdomen: Bowel sounds are normal. Abdomen is soft and nontender with no organomegaly, hernias, masses. GU: Deferred  Extremities:  No cyanosis, clubbing  Neurologic exam :Deep tendon reflexes are equal Skin: Warm & dry w/o tenting. No significant lesions or rash.  See summary under each active problem in the Problem List with associated updated therapeutic plan:  Sleep disturbance Asked to consider referral to Dr. Chalice, sleep specialist.  Aortic atherosclerosis Currently A1c is 6%, prediabetic; LDL 86; and blood pressure controlled.  She does not smoke. She is genuinely concerned about her prognosis. Coronary CT calcium  scan could be considered to determine risk.  Obesity (BMI 30-39.9) Mediterranean, plant-based diet recommended.  If the A1c advances; metformin could be prescribed as this can help with weight gain. I also recommended the [employer] App which can help identify the healthiest food options.  Prediabetes Current A1c is 6%, down from a prior value of 6.4%.  If the A1c does reach 6.5% or greater; consider metformin initiation.  Hyperlipidemia Current LDL is 86; no change in current statin.  Monitor annually.  Mediterranean diet/plant-based recommended.  Thoracic back pain 02/06/2023 imaging revealed minimal degenerative changes of the lumbosacral spine.  I have asked her to consider an MRI to optimally evaluate her back and lower extremity symptoms.

## 2024-02-21 NOTE — Patient Instructions (Addendum)
 If the thoracic and lumbar spine symptoms persist or progress; MRI imaging would be necessary to adequately evaluate the etiology of your symptoms. Your chronic sleep issues would be best evaluated by sleep specialist such as Dr. Chalice as there have been advances in this area. You have absolutely no reason to worry about your present lab values as they are at goal for prevention.  The Mediterranean diet would be the best diet and suit your genetic makeup. The [employer]  App may help identify those food choices which will enhance your goals. Please consult the [employer] books .

## 2024-02-22 ENCOUNTER — Encounter: Payer: Self-pay | Admitting: Internal Medicine

## 2024-02-22 DIAGNOSIS — R7303 Prediabetes: Secondary | ICD-10-CM | POA: Insufficient documentation

## 2024-02-22 NOTE — Assessment & Plan Note (Signed)
 Mediterranean, plant-based diet recommended.  If the A1c advances; metformin could be prescribed as this can help with weight gain. I also recommended the [employer] App which can help identify the healthiest food options.

## 2024-02-22 NOTE — Assessment & Plan Note (Signed)
 02/06/2023 imaging revealed minimal degenerative changes of the lumbosacral spine.  I have asked her to consider an MRI to optimally evaluate her back and lower extremity symptoms.

## 2024-02-22 NOTE — Assessment & Plan Note (Signed)
 Current LDL is 86; no change in current statin.  Monitor annually.  Mediterranean diet/plant-based recommended.

## 2024-02-22 NOTE — Assessment & Plan Note (Addendum)
 Currently A1c is 6%, prediabetic; LDL 86; and blood pressure controlled.  She does not smoke. She is genuinely concerned about her prognosis. Coronary CT calcium  scan could be considered to determine risk.

## 2024-02-22 NOTE — Assessment & Plan Note (Signed)
 Current A1c is 6%, down from a prior value of 6.4%.  If the A1c does reach 6.5% or greater; consider metformin initiation.

## 2024-02-22 NOTE — Assessment & Plan Note (Signed)
 Asked to consider referral to Dr. Chalice, sleep specialist.

## 2024-02-23 ENCOUNTER — Encounter: Admitting: Student

## 2024-03-04 ENCOUNTER — Other Ambulatory Visit: Payer: Self-pay

## 2024-03-04 DIAGNOSIS — E785 Hyperlipidemia, unspecified: Secondary | ICD-10-CM

## 2024-03-04 MED ORDER — ATORVASTATIN CALCIUM 20 MG PO TABS: 20.0000 mg | ORAL_TABLET | Freq: Every day | ORAL | 2 refills | Status: AC

## 2024-03-06 ENCOUNTER — Other Ambulatory Visit: Payer: Self-pay

## 2024-03-06 DIAGNOSIS — I491 Atrial premature depolarization: Secondary | ICD-10-CM

## 2024-03-06 MED ORDER — METOPROLOL SUCCINATE ER 25 MG PO TB24
ORAL_TABLET | ORAL | 1 refills | Status: AC
Start: 2024-03-06 — End: ?

## 2024-03-07 ENCOUNTER — Other Ambulatory Visit: Payer: Self-pay | Admitting: Nurse Practitioner

## 2024-03-07 DIAGNOSIS — J4520 Mild intermittent asthma, uncomplicated: Secondary | ICD-10-CM

## 2024-03-07 NOTE — Telephone Encounter (Unsigned)
 Copied from CRM [id number]. Topic: Clinical - Medication Refill >> Mar 07, 2024  2:12 PM Debby BROCKS wrote: Medication: montelukast  (SINGULAIR ) 10 MG tablet  Has the patient contacted their pharmacy? Yes (Agent: If no, request that the patient contact the pharmacy for the refill. If patient does not wish to contact the pharmacy document the reason why and proceed with request.) (Agent: If yes, when and what did the pharmacy advise?) Pharmacy sent over scripts but no reply (Dr. Abdul was Authorizing Provider)  This is the patient's preferred pharmacy:  [id number] - [place], [place] - [address] AT [facility] OF [address] [place] [address] Phone: [phone] Fax: [phone]  Is this the correct pharmacy for this prescription? Yes If no, delete pharmacy and type the correct one.   Has the prescription been filled recently? No  Is the patient out of the medication? Yes  Has the patient been seen for an appointment in the last year OR does the patient have an upcoming appointment? Yes  Can we respond through MyChart? Yes  Agent: Please be advised that Rx refills may take up to 3 business days. We ask that you follow-up with your pharmacy.

## 2024-03-08 MED ORDER — MONTELUKAST SODIUM 10 MG PO TABS
ORAL_TABLET | ORAL | 3 refills | Status: AC
Start: 2024-03-08 — End: ?

## 2024-04-30 ENCOUNTER — Encounter: Payer: PPO | Admitting: Nurse Practitioner

## 2024-04-30 ENCOUNTER — Other Ambulatory Visit: Payer: Self-pay

## 2024-04-30 DIAGNOSIS — M461 Sacroiliitis, not elsewhere classified: Secondary | ICD-10-CM

## 2024-04-30 MED ORDER — GABAPENTIN 300 MG PO CAPS: 300.0000 mg | ORAL_CAPSULE | Freq: Every day | ORAL | 1 refills | Status: AC

## 2024-05-07 ENCOUNTER — Ambulatory Visit: Admitting: Nurse Practitioner

## 2024-05-07 ENCOUNTER — Encounter: Payer: Self-pay | Admitting: Nurse Practitioner

## 2024-05-07 VITALS — BP 134/82 | HR 82 | Temp 97.7°F | Ht <= 58 in | Wt 137.0 lb

## 2024-05-07 DIAGNOSIS — Z0000 Encounter for general adult medical examination without abnormal findings: Secondary | ICD-10-CM

## 2024-05-07 NOTE — Patient Instructions (Signed)
 Shelby Oliver,  Thank you for taking the time for your Medicare Wellness Visit. I appreciate your continued commitment to your health goals. Please review the care plan we discussed, and feel free to reach out if I can assist you further.  Please note that Annual Wellness Visits do not include a physical exam. Some assessments may be limited, especially if the visit was conducted virtually. If needed, we may recommend an in-person follow-up with your provider.  Ongoing Care Seeing your primary care provider every 3 to 6 months helps us  monitor your health and provide consistent, personalized care.   Referrals If a referral was made during today's visit and you haven't received any updates within two weeks, please contact the referred provider directly to check on the status.  Recommended Screenings:  Health Maintenance  Topic Date Due   Hepatitis C Screening  Never done   Medicare Annual Wellness Visit  04/26/2024   COVID-19 Vaccine (12 - 2025-26 season) 08/30/2024   DTaP/Tdap/Td vaccine (3 - Tdap) 02/17/2025   Pneumococcal Vaccine for age over 23  Completed   Flu Shot  Completed   Osteoporosis screening with Bone Density Scan  Completed   Zoster (Shingles) Vaccine  Completed   Meningitis B Vaccine  Aged Out   Breast Cancer Screening  Discontinued   Colon Cancer Screening  Discontinued       05/07/2024   10:34 AM  Advanced Directives  Does Patient Have a Medical Advance Directive? Yes  Type of Estate Agent of [place];Out of facility DNR (pink MOST or yellow form);Living will  Does patient want to make changes to medical advance directive? No - Patient declined  Copy of Healthcare Power of Attorney in Chart? Yes - validated most recent copy scanned in chart (See row information)    Vision: Annual vision screenings are recommended for early detection of glaucoma, cataracts, and diabetic retinopathy. These exams can also reveal signs of chronic conditions  such as diabetes and high blood pressure.  Dental: Annual dental screenings help detect early signs of oral cancer, gum disease, and other conditions linked to overall health, including heart disease and diabetes.  Please see the attached documents for additional preventive care recommendations.   Recommended HEP C screening with next blood work

## 2024-05-07 NOTE — Progress Notes (Signed)
 "  Chief Complaint  Patient presents with   Medicare Wellness    AWV     Subjective:   Shelby Oliver is a 78 y.o. female who presents for a Medicare Annual Wellness Visit.  Visit info / Clinical Intake: Medicare Wellness Visit Type:: Subsequent Annual Wellness Visit Persons participating in visit and providing information:: patient Medicare Wellness Visit Mode:: In-person (required for WTM) Interpreter Needed?: No Pre-visit prep was completed: yes AWV questionnaire completed by patient prior to visit?: yes Living arrangements:: (!) lives alone Patient's Overall Health Status Rating: good Typical amount of pain: some Does pain affect daily life?: (!) yes Are you currently prescribed opioids?: no  Dietary Habits and Nutritional Risks How many meals a day?: 2 Eats fruit and vegetables daily?: yes Most meals are obtained by: preparing own meals In the last 2 weeks, have you had any of the following?: none Diabetic:: no  Functional Status Activities of Daily Living (to include ambulation/medication): Independent Ambulation: Independent Medication Administration: Independent Home Management (perform basic housework or laundry): Independent Manage your own finances?: yes Primary transportation is: facility / other Concerns about vision?: no *vision screening is required for WTM* Concerns about hearing?: no  Fall Screening Falls in the past year?: 0 Number of falls in past year: 0 Was there an injury with Fall?: 0 Fall Risk Category Calculator: 0 Patient Fall Risk Level: Low Fall Risk  Fall Risk Patient at Risk for Falls Due to: No Fall Risks Fall risk Follow up: Falls evaluation completed  Home and Transportation Safety: All rugs have non-skid backing?: N/A, no rugs All stairs or steps have railings?: yes Grab bars in the bathtub or shower?: yes Have non-skid surface in bathtub or shower?: yes Good home lighting?: yes Regular seat belt use?: yes Hospital stays in  the last year:: no  Cognitive Assessment Difficulty concentrating, remembering, or making decisions? : yes Will 6CIT or Mini Cog be Completed: yes What year is it?: 0 points What month is it?: 0 points Give patient an address phrase to remember (5 components): [address] [facility] [place]  About what time is it?: 0 points Count backwards from 20 to 1: 0 points Say the months of the year in reverse: 0 points Repeat the address phrase from earlier: 0 points 6 CIT Score: 0 points  Advance Directives (For Healthcare) Does Patient Have a Medical Advance Directive?: Yes Does patient want to make changes to medical advance directive?: No - Patient declined Type of Advance Directive: Healthcare Power of [place]; Out of facility DNR (pink MOST or yellow form); Living will Copy of Healthcare Power of Attorney in Chart?: Yes - validated most recent copy scanned in chart (See row information) Copy of Living Will in Chart?: Yes - validated most recent copy scanned in chart (See row information) Out of facility DNR (pink MOST or yellow form) in Chart? (Ambulatory ONLY): Yes - validated most recent copy scanned in chart  Reviewed/Updated  Reviewed/Updated: Reviewed All (Medical, Surgical, Family, Medications, Allergies, Care Teams, Patient Goals)    Allergies (verified) Azithromycin , Avelox  [moxifloxacin  hcl in nacl], Ciprocin-fluocin-procin [fluocinolone], Penicillins, and Quinolones   Current Medications (verified) Outpatient Encounter Medications as of 05/07/2024  Medication Sig   acetaminophen (TYLENOL) 500 MG tablet Take 1,000 mg by mouth every 8 (eight) hours as needed.   atorvastatin  (LIPITOR) 20 MG tablet Take 1 tablet (20 mg total) by mouth daily.   cetirizine (ZYRTEC) 10 MG tablet Take 10 mg by mouth daily.   cholecalciferol (VITAMIN D ) 1000 units  tablet Take 3,000 Units by mouth daily.   gabapentin  (NEURONTIN ) 300 MG capsule Take 1 capsule (300 mg total) by mouth at bedtime.    metoprolol  succinate (TOPROL -XL) 25 MG 24 hr tablet TAKE 1 TABLET(25 MG) BY MOUTH DAILY   montelukast  (SINGULAIR ) 10 MG tablet TAKE 1 TABLET(10 MG) BY MOUTH DAILY   Olopatadine HCl 0.7 % SOLN Place 1 drop into both eyes daily.   pantoprazole  (PROTONIX ) 40 MG tablet Take 1 tablet (40 mg total) by mouth daily.   Polyethyl Glycol-Propyl Glycol (SYSTANE OP) Apply 1 drop to eye as needed.   Loteprednol Etabonate (EYSUVIS) 0.25 % SUSP Apply 1 drop to eye daily. (Patient not taking: Reported on 05/07/2024)   No facility-administered encounter medications on file as of 05/07/2024.    History: Past Medical History:  Diagnosis Date   Chronic kidney disease    RENAL INSUFF   Complication of anesthesia    RESPIRATORY PROBLEMS WITH SHOULDER SURGERY   Dysrhythmia    Edema    FEET/ANKLES   Erosive esophagitis    Gallbladder polyp    GERD (gastroesophageal reflux disease)    Heart murmur    Hepatic hemangioma    Hepatitis    A IN PAST   History of hiatal hernia    Hyperlipidemia    Mild intermittent asthma    MVP (mitral valve prolapse)    with slight regurgitation   Orthopnea    Osteoporosis    Proteinuria    Pulmonary nodule 2000's   stable over years--no more testing   Sleep disturbance    chronic   Thyroid  nodule    Past Surgical History:  Procedure Laterality Date   APPENDECTOMY     CARPAL TUNNEL RELEASE Bilateral 1987   CATARACT EXTRACTION W/PHACO Left 08/17/2017   Procedure: CATARACT EXTRACTION PHACO AND INTRAOCULAR LENS PLACEMENT (IOC);  Surgeon: Myrna Adine Anes, MD;  Location: [facility] ORS;  Service: Ophthalmology;  Laterality: Left;  US  00:29.0 AP% 11.1 CDE 3.21 Fluid Pack lot # [id number]   CATARACT EXTRACTION W/PHACO Right 09/14/2017   Procedure: CATARACT EXTRACTION PHACO AND INTRAOCULAR LENS PLACEMENT (IOC);  Surgeon: Myrna Adine Anes, MD;  Location: [facility] ORS;  Service: Ophthalmology;  Laterality: Right;  US  00:21.0 AP% 4.7 CDE 1.00 Fluid Pack Lot # [id number]   GANGLION  CYST EXCISION Right    SHOULDER SURGERY Bilateral 1988, 2001   TONSILLECTOMY     TRIGGER FINGER RELEASE Right    Family History  Problem Relation Age of Onset   Hyperlipidemia Mother    Arthritis Mother    Hypertension Mother    Kidney cancer Mother    Stroke Father        hemorrhagic   Arthritis Father    Heart disease Sister        mitral valve repair   Hypertension Sister    Congestive Heart Failure Sister    Diabetes Neg Hx    Colon cancer Neg Hx    Esophageal cancer Neg Hx    Rectal cancer Neg Hx    Stomach cancer Neg Hx    Social History   Occupational History   Occupation: Astronomer    Comment: Retired   Occupation: retired  Tobacco Use   Smoking status: Never    Passive exposure: Past   Smokeless tobacco: Never  Vaping Use   Vaping status: Never Used  Substance and Sexual Activity   Alcohol use: No   Drug use: No   Sexual activity: Not on  file   Tobacco Counseling Counseling given: Not Answered  SDOH Screenings   Food Insecurity: No Food Insecurity (05/07/2024)  Housing: Unknown (05/07/2024)  Transportation Needs: No Transportation Needs (05/07/2024)  Utilities: Not At Risk (05/07/2024)  Depression (PHQ2-9): Low Risk (05/07/2024)  Financial Resource Strain: Low Risk (05/05/2024)  Physical Activity: Insufficiently Active (05/05/2024)  Social Connections: Moderately Integrated (05/05/2024)  Stress: No Stress Concern Present (05/05/2024)  Tobacco Use: Low Risk (05/07/2024)   See flowsheets for full screening details  Depression Screen PHQ 2 & 9 Depression Scale- Over the past 2 weeks, how often have you been bothered by any of the following problems? Little interest or pleasure in doing things: 0 Feeling down, depressed, or hopeless (PHQ Adolescent also includes...irritable): 0 PHQ-2 Total Score: 0     Goals Addressed   None          Objective:    Today's Vitals   05/07/24 1030  BP: 134/82  Pulse: 82  Temp:  97.7 F (36.5 C)  SpO2: 99%  Weight: 137 lb (62.1 kg)  Height: 4' 5 (1.346 m)   Body mass index is 34.29 kg/m.  Hearing/Vision screen Vision Screening - Comments:: Dr. Adine Novak with [facility] Last Exam: 01/2024 Immunizations and Health Maintenance Health Maintenance  Topic Date Due   Hepatitis C Screening  Never done   Medicare Annual Wellness (AWV)  04/26/2024   COVID-19 Vaccine (12 - 2025-26 season) 08/30/2024   DTaP/Tdap/Td (3 - Tdap) 02/17/2025   Pneumococcal Vaccine: 50+ Years  Completed   Influenza Vaccine  Completed   Bone Density Scan  Completed   Zoster Vaccines- Shingrix  Completed   Meningococcal B Vaccine  Aged Out   Mammogram  Discontinued   Colonoscopy  Discontinued        Assessment/Plan:  This is a routine wellness examination for [place].  Patient Care Team: Laurence Locus, DO as PCP - General (Internal Medicine)  I have personally reviewed and noted the following in the patients chart:   Medical and social history Use of alcohol, tobacco or illicit drugs  Current medications and supplements including opioid prescriptions. Functional ability and status Nutritional status Physical activity Advanced directives List of other physicians Hospitalizations, surgeries, and ER visits in previous 12 months Vitals Screenings to include cognitive, depression, and falls Referrals and appointments  No orders of the defined types were placed in this encounter.  In addition, I have reviewed and discussed with patient certain preventive protocols, quality metrics, and best practice recommendations. A written personalized care plan for preventive services as well as general preventive health recommendations were provided to patient.   Harlene MARLA An, NP   05/07/2024   Return in 1 year (on 05/07/2025) for AWV.   "

## 2024-05-21 ENCOUNTER — Ambulatory Visit: Admitting: Internal Medicine

## 2024-05-21 ENCOUNTER — Encounter: Payer: Self-pay | Admitting: Internal Medicine

## 2024-05-21 VITALS — BP 128/70 | HR 81 | Ht <= 58 in | Wt 138.0 lb

## 2024-05-21 DIAGNOSIS — K449 Diaphragmatic hernia without obstruction or gangrene: Secondary | ICD-10-CM | POA: Diagnosis not present

## 2024-05-21 DIAGNOSIS — K222 Esophageal obstruction: Secondary | ICD-10-CM

## 2024-05-21 DIAGNOSIS — K2100 Gastro-esophageal reflux disease with esophagitis, without bleeding: Secondary | ICD-10-CM | POA: Diagnosis not present

## 2024-05-21 DIAGNOSIS — R1310 Dysphagia, unspecified: Secondary | ICD-10-CM | POA: Diagnosis not present

## 2024-05-21 DIAGNOSIS — Z6834 Body mass index (BMI) 34.0-34.9, adult: Secondary | ICD-10-CM

## 2024-05-21 DIAGNOSIS — E669 Obesity, unspecified: Secondary | ICD-10-CM | POA: Diagnosis not present

## 2024-05-21 DIAGNOSIS — K219 Gastro-esophageal reflux disease without esophagitis: Secondary | ICD-10-CM

## 2024-05-21 NOTE — Progress Notes (Signed)
 HISTORY OF PRESENT ILLNESS:  Shelby Oliver is a 79 y.o. female with past medical history as listed below.  She presents today for routine follow-up regarding management of her chronic GERD complicated by esophagitis, peptic stricture, and large hiatal hernia.  Last seen June 14, 2023.  See that dictation.  Patient continues on pantoprazole  40 mg daily.  She tells me that she has been doing well.  Has a clean diet.  No breakthrough symptoms or regurgitation.  No recurrent dysphagia.  She does mention some concerns over weight gain.  Last EGD with dilation January 06, 2023  Laboratories: Blood work from November 13, 2023 shows normal CBC with hemoglobin 12.8  REVIEW OF SYSTEMS:  All non-GI ROS negative.  Past Medical History:  Diagnosis Date   Chronic kidney disease    RENAL INSUFF   Complication of anesthesia    RESPIRATORY PROBLEMS WITH SHOULDER SURGERY   Dysrhythmia    Edema    FEET/ANKLES   Erosive esophagitis    Gallbladder polyp    GERD (gastroesophageal reflux disease)    Heart murmur    Hepatic hemangioma    Hepatitis    A IN PAST   History of hiatal hernia    Hyperlipidemia    Mild intermittent asthma    MVP (mitral valve prolapse)    with slight regurgitation   Orthopnea    Osteoporosis    Proteinuria    Pulmonary nodule 2000's   stable over years--no more testing   Sleep disturbance    chronic   Thyroid  nodule     Past Surgical History:  Procedure Laterality Date   APPENDECTOMY     CARPAL TUNNEL RELEASE Bilateral 1987   CATARACT EXTRACTION W/PHACO Left 08/17/2017   Procedure: CATARACT EXTRACTION PHACO AND INTRAOCULAR LENS PLACEMENT (IOC);  Surgeon: Myrna Adine Anes, MD;  Location: [facility] ORS;  Service: Ophthalmology;  Laterality: Left;  US  00:29.0 AP% 11.1 CDE 3.21 Fluid Pack lot # [id number]   CATARACT EXTRACTION W/PHACO Right 09/14/2017   Procedure: CATARACT EXTRACTION PHACO AND INTRAOCULAR LENS PLACEMENT (IOC);  Surgeon: Myrna Adine Anes, MD;  Location:  [facility] ORS;  Service: Ophthalmology;  Laterality: Right;  US  00:21.0 AP% 4.7 CDE 1.00 Fluid Pack Lot # [id number]   GANGLION CYST EXCISION Right    SHOULDER SURGERY Bilateral 1988, 2001   TONSILLECTOMY     TRIGGER FINGER RELEASE Right     Social History Aizley Stenseth  reports that she has never smoked. She has been exposed to tobacco smoke. She has never used smokeless tobacco. She reports that she does not drink alcohol and does not use drugs.  family history includes Arthritis in her father and mother; Congestive Heart Failure in her sister; Heart disease in her sister; Hyperlipidemia in her mother; Hypertension in her mother and sister; Kidney cancer in her mother; Stroke in her father.  Allergies[1]     PHYSICAL EXAMINATION: Vital signs: BP 128/70   Pulse 81   Ht 4' 5 (1.346 m)   Wt 138 lb (62.6 kg)   SpO2 97%   BMI 34.54 kg/m   Constitutional: generally well-appearing, no acute distress Psychiatric: alert and oriented x3, cooperative Eyes: extraocular movements intact, anicteric, conjunctiva pink Mouth: oral pharynx moist, no lesions Neck: supple no lymphadenopathy Cardiovascular: heart regular rate and rhythm, no murmur Lungs: clear to auscultation bilaterally Abdomen: soft, nontender, nondistended, no obvious ascites, no peritoneal signs, normal bowel sounds, no organomegaly Rectal: Omitted Extremities: no clubbing, cyanosis, or lower extremity edema bilaterally Skin: no lesions  on visible extremities Neuro: No focal deficits.   ASSESSMENT:   1.  GERD with esophagitis, peptic stricture, and large hiatal hernia.  Currently asymptomatic post dilation on PPI 2.  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  Improved post dilation. 3.  Obesity 4.  General Medical problems.  Stable     PLAN:   1.  Reflux precautions with attention to weight loss 2.  Encouraged to continue PPI therapy given the complications of esophagitis and stricture 3.  Prescription refilled for 1  year.  Medication risks reviewed in detail. 4.  Routine office follow-up 1 year.  Please contact the office in the interim for any questions or problems Total time of 30 minutes was spent preparing to see the patient, obtaining interval history, performing medically appropriate physical examination, counseling and educating the patient regarding the above listed issues, answering multiple questions, ordering medication, defining follow-up intervals, and documenting clinical information in the health record       [1]  Allergies Allergen Reactions   Azithromycin  Shortness Of Breath    SOB abd pain diarrhea   Avelox  [Moxifloxacin  Hcl In Nacl] Hives   Ciprocin-Fluocin-Procin [Fluocinolone]    Penicillins Hives    Has patient had a PCN reaction causing immediate rash, facial/tongue/throat swelling, SOB or lightheadedness with hypotension: no Has patient had a PCN reaction causing severe rash involving mucus membranes or skin necrosis: no Has patient had a PCN reaction that required hospitalization: no Has patient had a PCN reaction occurring within the last 10 years: no If all of the above answers are NO, then may proceed with Cephalosporin use.    Quinolones Hives

## 2024-05-21 NOTE — Patient Instructions (Signed)
 Please follow up in a year.  _______________________________________________________  If your blood pressure at your visit was 140/90 or greater, please contact your primary care physician to follow up on this.  _______________________________________________________  If you are age 79 or older, your body mass index should be between 23-30. Your Body mass index is 34.54 kg/m. If this is out of the aforementioned range listed, please consider follow up with your Primary Care Provider.  If you are age 86 or younger, your body mass index should be between 19-25. Your Body mass index is 34.54 kg/m. If this is out of the aformentioned range listed, please consider follow up with your Primary Care Provider.   ________________________________________________________  The [facility] providers would like to encourage you to use MYCHART to communicate with providers for non-urgent requests or questions.  Due to long hold times on the telephone, sending your provider a message by [facility] may be a faster and more efficient way to get a response.  Please allow 48 business hours for a response.  Please remember that this is for non-urgent requests.  _______________________________________________________  [facility] is using a team-based approach to care.  Your team is made up of your doctor and two to three APPS. Our APPS (Nurse Practitioners and Physician Assistants) work with your physician to ensure care continuity for you. They are fully qualified to address your health concerns and develop a treatment plan. They communicate directly with your gastroenterologist to care for you. Seeing the Advanced Practice Practitioners on your physician's team can help you by facilitating care more promptly, often allowing for earlier appointments, access to diagnostic testing, procedures, and other specialty referrals.

## 2024-06-11 ENCOUNTER — Encounter: Payer: Self-pay | Admitting: Nurse Practitioner

## 2024-06-11 ENCOUNTER — Ambulatory Visit: Admitting: Nurse Practitioner

## 2024-06-11 VITALS — BP 126/84 | HR 82 | Temp 98.2°F | Ht <= 58 in | Wt 138.0 lb

## 2024-06-11 DIAGNOSIS — M810 Age-related osteoporosis without current pathological fracture: Secondary | ICD-10-CM

## 2024-06-11 DIAGNOSIS — R7303 Prediabetes: Secondary | ICD-10-CM | POA: Diagnosis not present

## 2024-06-11 DIAGNOSIS — J4520 Mild intermittent asthma, uncomplicated: Secondary | ICD-10-CM | POA: Diagnosis not present

## 2024-06-11 DIAGNOSIS — I491 Atrial premature depolarization: Secondary | ICD-10-CM

## 2024-06-11 DIAGNOSIS — E669 Obesity, unspecified: Secondary | ICD-10-CM

## 2024-06-11 DIAGNOSIS — G6289 Other specified polyneuropathies: Secondary | ICD-10-CM | POA: Diagnosis not present

## 2024-06-11 DIAGNOSIS — E782 Mixed hyperlipidemia: Secondary | ICD-10-CM

## 2024-06-11 DIAGNOSIS — K2100 Gastro-esophageal reflux disease with esophagitis, without bleeding: Secondary | ICD-10-CM | POA: Diagnosis not present

## 2024-06-11 NOTE — Patient Instructions (Addendum)
 [facility] scheduled for 06/17/2024

## 2024-10-08 ENCOUNTER — Ambulatory Visit: Admitting: Nurse Practitioner

## 2025-05-13 ENCOUNTER — Ambulatory Visit: Admitting: Nurse Practitioner
